# Patient Record
Sex: Male | Born: 1975 | Race: Asian | Hispanic: No | Marital: Married | Smoking: Former smoker
Health system: Southern US, Community
[De-identification: ages and names within clinical notes are randomized; demographics above are authoritative.]

## PROBLEM LIST (undated history)

## (undated) DIAGNOSIS — L729 Follicular cyst of the skin and subcutaneous tissue, unspecified: Secondary | ICD-10-CM

## (undated) DIAGNOSIS — D329 Benign neoplasm of meninges, unspecified: Secondary | ICD-10-CM

## (undated) DIAGNOSIS — E119 Type 2 diabetes mellitus without complications: Secondary | ICD-10-CM

## (undated) DIAGNOSIS — J302 Other seasonal allergic rhinitis: Secondary | ICD-10-CM

## (undated) DIAGNOSIS — K759 Inflammatory liver disease, unspecified: Secondary | ICD-10-CM

---

## 2019-12-05 ENCOUNTER — Encounter: Payer: Self-pay | Admitting: Plastic Surgery

## 2019-12-05 ENCOUNTER — Ambulatory Visit (INDEPENDENT_AMBULATORY_CARE_PROVIDER_SITE_OTHER): Payer: 59 | Admitting: Plastic Surgery

## 2019-12-05 ENCOUNTER — Other Ambulatory Visit: Payer: Self-pay

## 2019-12-05 VITALS — BP 120/76 | HR 78 | Temp 98.2°F | Ht 65.0 in | Wt 192.4 lb

## 2019-12-05 DIAGNOSIS — D179 Benign lipomatous neoplasm, unspecified: Secondary | ICD-10-CM | POA: Diagnosis not present

## 2019-12-05 DIAGNOSIS — D170 Benign lipomatous neoplasm of skin and subcutaneous tissue of head, face and neck: Secondary | ICD-10-CM | POA: Diagnosis not present

## 2019-12-05 NOTE — Progress Notes (Signed)
   Referring Provider Willeen Niece, [address] [place] [place],  [place] [id number]   CC:  Chief Complaint  Patient presents with  . Advice Only    for mass on top of head      Harold Torres is an 44 y.o. male.  HPI: Patient presents with a 63-month month history of a scalp mass that is increasing in size.  He says it started small and has grown to about 7 inches in diameter.  It does not particularly hurt him but he is concerned about it and wants it to be removed.  He has been referred from his primary care to a dermatologist and subsequently to me.  He has not had any other worrisome lesions removed in the past.  No Known Allergies  Outpatient Encounter Medications as of 12/05/2019  Medication Sig  . metFORMIN (GLUCOPHAGE-XR) 500 MG 24 hr tablet Take 1,000 mg by mouth 2 (two) times daily.   No facility-administered encounter medications on file as of 12/05/2019.     No past medical history on file. Diabetes No family history on file.  Social History   Social History Narrative  . Not on file  -Denies tobacco use  Review of Systems General: Denies fevers, chills, weight loss CV: Denies chest pain, shortness of breath, palpitations  Physical Exam Vitals with BMI 12/05/2019  Height 5\' 5"   Weight 192 lbs 6 oz  BMI [id number]  Systolic [id number]  Diastolic 76  Pulse 78    General:  No acute distress,  Alert and oriented, Non-Toxic, Normal speech and affect Scalp: He has a approximately 15 cm diameter subcutaneous mass on the scalp vertex extending posteriorly.  It feels fairly firmly adherent but this could be just due to the size and location beneath the galea.  There is no overlying skin changes.  Assessment/Plan Patient presents with a large subcutaneous mass on the posterior scalp.  I suspect this is a lipoma.  Due to the accelerated growth, large size, and firm fixed feeling I would like to get a CT scan prior to going to the operating room.  We will plan to  get this in his lungs there is nothing alarming we will plan to excise in the operating room under general anesthesia.  I discussed the risk include bleeding, infection, damage surrounding structures, need for additional procedures.  He is fully understanding and will plan to proceed accordingly.  Cindra Presume 12/05/2019, 2:31 PM

## 2019-12-18 ENCOUNTER — Other Ambulatory Visit: Payer: Self-pay

## 2019-12-18 ENCOUNTER — Encounter: Payer: Self-pay | Admitting: Surgical

## 2019-12-18 ENCOUNTER — Encounter (HOSPITAL_BASED_OUTPATIENT_CLINIC_OR_DEPARTMENT_OTHER): Payer: Self-pay | Admitting: Plastic Surgery

## 2019-12-18 NOTE — Progress Notes (Signed)
Patient ID: Harold Torres, male    DOB: Sep 12, 1975, 44 y.o.   MRN: [id number]  Chief Complaint  Patient presents with  . Pre-op Exam      ICD-10-CM   1. 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  male  with a 6 month history of a scalp mass that has been increasing in size.  He presents for preoperative evaluation for upcoming procedure, excision of scalp mass, scheduled for 12/15/19 with Dr. Mingo Amber. Interpreter present throughout today's entire visit  Patient had a CT scan on 12/20/19 for further evaluation.  CT scan showed "9.9 x 8.3 x 4.2 cm soft tissue mass with peripheral calcifications is seen within the posterior scalp which is seen to cause significant destruction or erosion of the adjacent posterior Calvarium"   Patient reports no history of anesthesia.  No family history of any complications with anesthesia.  He is not taking any blood thinners.  No history or family history of DVT/PE.  No family or personal history of bleeding or clotting disorders.  He reports that he was recently diagnosed with diabetes, most recent A1c 13.1 approximately 1 month ago.  He also reports that he was recently diagnosed with hepatitis B and is planning to begin treatment for that soon.  He denies any visual changes, dizziness, weakness, abnormal gait.  PMH Significant for: DM2 on metformin - which was recently started. Most recent A1c 13.1 (11/06/19).    Past Medical History: Allergies: No Known Allergies  Current Medications:  Current Outpatient Medications:  .  metFORMIN (GLUCOPHAGE-XR) 500 MG 24 hr tablet, Take 1,000 mg by mouth 2 (two) times daily., Disp: , Rfl:   Past Medical Problems: Past Medical History:  Diagnosis Date  . Diabetes mellitus without complication [facility])     Past Surgical History: History reviewed. No pertinent surgical history.  Social History: Social History   Socioeconomic History  . Marital status:  Married    Spouse name: Not on file  . Number of children: Not on file  . Years of education: Not on file  . Highest education level: Not on file  Occupational History  . Not on file  Tobacco Use  . Smoking status: Never Smoker  . Smokeless tobacco: Never Used  Substance and Sexual Activity  . Alcohol use: Not on file  . Drug use: Not on file  . Sexual activity: Not on file  Other Topics Concern  . Not on file  Social History Narrative  . Not on file   Social Determinants of Health   Financial Resource Strain:   . Difficulty of Paying Living Expenses:   Food Insecurity:   . Worried About Charity fundraiser in the Last Year:   . Arboriculturist in the Last Year:   Transportation Needs:   . Film/video editor (Medical):   Marland Kitchen Lack of Transportation (Non-Medical):   Physical Activity:   . Days of Exercise per Week:   . Minutes of Exercise per Session:   Stress:   . Feeling of Stress :   Social Connections:   . Frequency of Communication with Friends and Family:   . Frequency of Social Gatherings with Friends and Family:   . Attends Religious Services:   . Active Member of Clubs or Organizations:   . Attends Archivist Meetings:   Marland Kitchen Marital Status:   Intimate Partner Violence:   .  Fear of Current or Ex-Partner:   . Emotionally Abused:   Marland Kitchen Physically Abused:   . Sexually Abused:     Family History: History reviewed. No pertinent family history.  Review of Systems: Review of Systems  Constitutional: Negative.   Respiratory: Negative.   Cardiovascular: Negative.   Neurological: Negative for dizziness, sensory change, weakness and headaches.    Physical Exam: Vital Signs BP 123/81 (BP Location: Left Arm, Patient Position: Sitting, Cuff Size: Large)   Pulse 75   Temp 97.8 F (36.6 C) (Temporal)   Ht 5\' 5"  (1.651 m)   Wt 191 lb 9.6 oz (86.9 kg)   SpO2 97%   BMI 31.88 kg/m  Physical Exam  Constitutional:      General: not in acute distress.     Appearance: Normal appearance. not ill-appearing.  HENT:     Head: Large posterior head mass Eyes:     Pupils: Pupils are equal, round Neck:     Musculoskeletal: Normal range of motion.  Pulmonary:     Effort: Pulmonary effort is normal. No respiratory distress.  Musculoskeletal: Normal range of motion.  Skin:    General: Skin is warm and dry.     Findings: No erythema or rash.  Neurological:     General: No focal deficit present.     Mental Status:  alert and oriented to person, place, and time. Mental status is at baseline.     Motor: No weakness.  Psychiatric:        Mood and Affect: Mood normal.        Behavior: Behavior normal.     Assessment/Plan: Surgery has been canceled.  Smoking Status: Non-smoker Pictures obtained: 12/05/19  Patient was provided with the General Surgical Risk consent document and Pain Medication Agreement prior to their appointment.  They had adequate time to read through the risk consent documents and Pain Medication Agreement. We also discussed them in person together during this preop appointment. All of their questions were answered to their satisfaction.  Recommended calling if they have any further questions.  Risk consent form and Pain Medication Agreement to be scanned into patient's chart.  After reviewing CT scan results with Dr. Claudia Desanctis, we have referred patient for a urgent consult to neurosurgery for further evaluation of head mass due to mass invading the calvarium.  Discussed with patient that we remain available as needed for any assistance with soft tissue coverage after excision large head mass.  It was recommended patient have an MRI for further evaluation, will defer this to neuro surgery to allow them to determine best imaging methods.  Please call with any questions or concerns.  Surgery has been canceled.   Electronically signed by: Carola Rhine Aerabella Galasso, PA-C 12/21/2019 11:37 AM

## 2019-12-20 ENCOUNTER — Ambulatory Visit (HOSPITAL_COMMUNITY)
Admission: RE | Admit: 2019-12-20 | Discharge: 2019-12-20 | Disposition: A | Discharge status: Home / Self Care | Payer: 59 | Source: Ambulatory Visit | Attending: Plastic Surgery | Admitting: Plastic Surgery

## 2019-12-20 ENCOUNTER — Other Ambulatory Visit: Payer: Self-pay

## 2019-12-20 DIAGNOSIS — D179 Benign lipomatous neoplasm, unspecified: Secondary | ICD-10-CM | POA: Insufficient documentation

## 2019-12-21 ENCOUNTER — Encounter: Payer: Self-pay | Admitting: Surgical

## 2019-12-21 ENCOUNTER — Ambulatory Visit (INDEPENDENT_AMBULATORY_CARE_PROVIDER_SITE_OTHER): Payer: 59 | Admitting: Surgical

## 2019-12-21 ENCOUNTER — Other Ambulatory Visit (HOSPITAL_COMMUNITY): Payer: 59

## 2019-12-21 VITALS — BP 123/81 | HR 75 | Temp 97.8°F | Ht 65.0 in | Wt 191.6 lb

## 2019-12-21 DIAGNOSIS — D170 Benign lipomatous neoplasm of skin and subcutaneous tissue of head, face and neck: Secondary | ICD-10-CM

## 2019-12-24 ENCOUNTER — Telehealth: Payer: Self-pay | Admitting: Plastic Surgery

## 2019-12-24 NOTE — Telephone Encounter (Signed)
[facility] called to advise Dr. Claudia Desanctis that patient is getting MRI and he will keep him in the loop as they go along.

## 2019-12-25 ENCOUNTER — Ambulatory Visit (HOSPITAL_BASED_OUTPATIENT_CLINIC_OR_DEPARTMENT_OTHER): Admission: RE | Admit: 2019-12-25 | Payer: 59 | Source: Home / Self Care | Admitting: Plastic Surgery

## 2019-12-25 HISTORY — DX: Type 2 diabetes mellitus without complications: E11.9

## 2019-12-25 SURGERY — EXCISION, MASS, HEAD
Anesthesia: General | Site: Scalp

## 2020-01-03 ENCOUNTER — Encounter: Payer: 59 | Admitting: Plastic Surgery

## 2020-01-22 ENCOUNTER — Other Ambulatory Visit: Payer: Self-pay | Admitting: Neurosurgery

## 2020-01-28 ENCOUNTER — Ambulatory Visit: Payer: 59 | Attending: Internal Medicine

## 2020-01-28 ENCOUNTER — Inpatient Hospital Stay (HOSPITAL_COMMUNITY): Admission: RE | Admit: 2020-01-28 | Payer: 59 | Source: Ambulatory Visit

## 2020-01-28 DIAGNOSIS — Z20822 Contact with and (suspected) exposure to covid-19: Secondary | ICD-10-CM

## 2020-01-29 LAB — SARS-COV-2, NAA 2 DAY TAT

## 2020-01-29 LAB — NOVEL CORONAVIRUS, NAA: SARS-CoV-2, NAA: NOT DETECTED

## 2020-01-30 ENCOUNTER — Encounter (HOSPITAL_COMMUNITY): Payer: Self-pay | Admitting: Neurosurgery

## 2020-01-30 ENCOUNTER — Other Ambulatory Visit: Payer: Self-pay

## 2020-01-30 NOTE — Progress Notes (Signed)
SDW-pre-op call completed using [place] Geophysical data processor) interpreter [place] # [phone].  Pt denies SOB, chest pain, and being under the care of a cardiologist. Pt denies having a stress test, echo and cardiac cath. Pt denies having an EKG and chest ray. Pt made aware to stop taking Aspirin (unless otherwise advised by surgeon), vitamins, fish oil and herbal medications. Do not take any NSAIDs ie: Ibuprofen, Advil, Naproxen (Aleve), Motrin, BC and Goody Powder. Pt made aware to not take Metformin on DOS. Pt made aware to check CBG every 2 hours prior to arrival to hospital on DOS. Pt made aware to treat a CBG < 70 with 4 ounces of apple or cranberry juice, wait 15 minutes after intervention to recheck CBG, if CBG remains < 70, call Short Stay unit to speak with a nurse. Pt reminded to quarantine. Pt verbalized understanding of all pre-op instructions.

## 2020-01-31 ENCOUNTER — Encounter (HOSPITAL_COMMUNITY): Payer: Self-pay | Admitting: Neurosurgery

## 2020-01-31 ENCOUNTER — Inpatient Hospital Stay (HOSPITAL_COMMUNITY): Payer: 59 | Admitting: Physician Assistant

## 2020-01-31 ENCOUNTER — Inpatient Hospital Stay (HOSPITAL_COMMUNITY)
Admission: RE | Admit: 2020-01-31 | Discharge: 2020-02-12 | DRG: 025 | Disposition: A | Discharge status: Home / Self Care | Payer: 59 | Attending: Neurosurgery | Admitting: Neurosurgery

## 2020-01-31 ENCOUNTER — Inpatient Hospital Stay (HOSPITAL_COMMUNITY): Payer: 59

## 2020-01-31 ENCOUNTER — Other Ambulatory Visit: Payer: Self-pay

## 2020-01-31 ENCOUNTER — Inpatient Hospital Stay (HOSPITAL_COMMUNITY)
Admission: RE | Disposition: A | Discharge status: Home / Self Care | Payer: Self-pay | Source: Home / Self Care | Attending: Neurosurgery

## 2020-01-31 DIAGNOSIS — D62 Acute posthemorrhagic anemia: Secondary | ICD-10-CM | POA: Diagnosis not present

## 2020-01-31 DIAGNOSIS — D6959 Other secondary thrombocytopenia: Secondary | ICD-10-CM | POA: Diagnosis not present

## 2020-01-31 DIAGNOSIS — R930 Abnormal findings on diagnostic imaging of skull and head, not elsewhere classified: Secondary | ICD-10-CM | POA: Diagnosis present

## 2020-01-31 DIAGNOSIS — Z9889 Other specified postprocedural states: Secondary | ICD-10-CM

## 2020-01-31 DIAGNOSIS — Z0189 Encounter for other specified special examinations: Secondary | ICD-10-CM

## 2020-01-31 DIAGNOSIS — B1910 Unspecified viral hepatitis B without hepatic coma: Secondary | ICD-10-CM | POA: Diagnosis present

## 2020-01-31 DIAGNOSIS — J9589 Other postprocedural complications and disorders of respiratory system, not elsewhere classified: Secondary | ICD-10-CM | POA: Diagnosis not present

## 2020-01-31 DIAGNOSIS — M898X8 Other specified disorders of bone, other site: Secondary | ICD-10-CM | POA: Diagnosis present

## 2020-01-31 DIAGNOSIS — D320 Benign neoplasm of cerebral meninges: Secondary | ICD-10-CM | POA: Diagnosis present

## 2020-01-31 DIAGNOSIS — R578 Other shock: Secondary | ICD-10-CM | POA: Diagnosis not present

## 2020-01-31 DIAGNOSIS — K7400 Hepatic fibrosis, unspecified: Secondary | ICD-10-CM | POA: Diagnosis present

## 2020-01-31 DIAGNOSIS — Y838 Other surgical procedures as the cause of abnormal reaction of the patient, or of later complication, without mention of misadventure at the time of the procedure: Secondary | ICD-10-CM | POA: Diagnosis present

## 2020-01-31 DIAGNOSIS — Z978 Presence of other specified devices: Secondary | ICD-10-CM

## 2020-01-31 DIAGNOSIS — D420 Neoplasm of uncertain behavior of cerebral meninges: Secondary | ICD-10-CM | POA: Diagnosis present

## 2020-01-31 DIAGNOSIS — G9731 Intraoperative hemorrhage and hematoma of a nervous system organ or structure complicating a nervous system procedure: Secondary | ICD-10-CM | POA: Diagnosis not present

## 2020-01-31 DIAGNOSIS — C700 Malignant neoplasm of cerebral meninges: Principal | ICD-10-CM | POA: Diagnosis present

## 2020-01-31 DIAGNOSIS — D481 Neoplasm of uncertain behavior of connective and other soft tissue: Secondary | ICD-10-CM | POA: Diagnosis not present

## 2020-01-31 DIAGNOSIS — Z87891 Personal history of nicotine dependence: Secondary | ICD-10-CM

## 2020-01-31 DIAGNOSIS — R1110 Vomiting, unspecified: Secondary | ICD-10-CM

## 2020-01-31 DIAGNOSIS — E119 Type 2 diabetes mellitus without complications: Secondary | ICD-10-CM | POA: Diagnosis not present

## 2020-01-31 DIAGNOSIS — Z4659 Encounter for fitting and adjustment of other gastrointestinal appliance and device: Secondary | ICD-10-CM

## 2020-01-31 DIAGNOSIS — J95821 Acute postprocedural respiratory failure: Secondary | ICD-10-CM | POA: Diagnosis not present

## 2020-01-31 DIAGNOSIS — B169 Acute hepatitis B without delta-agent and without hepatic coma: Secondary | ICD-10-CM | POA: Diagnosis not present

## 2020-01-31 HISTORY — DX: Benign neoplasm of meninges, unspecified: D32.9

## 2020-01-31 HISTORY — PX: CRANIOTOMY: SHX93

## 2020-01-31 HISTORY — DX: Follicular cyst of the skin and subcutaneous tissue, unspecified: L72.9

## 2020-01-31 HISTORY — DX: Inflammatory liver disease, unspecified: K75.9

## 2020-01-31 LAB — COMPREHENSIVE METABOLIC PANEL
ALT: 45 U/L — ABNORMAL HIGH (ref 0–44)
ALT: 65 U/L — ABNORMAL HIGH (ref 0–44)
AST: 32 U/L (ref 15–41)
AST: 65 U/L — ABNORMAL HIGH (ref 15–41)
Albumin: 3.3 g/dL — ABNORMAL LOW (ref 3.5–5.0)
Albumin: 2.7 g/dL — ABNORMAL LOW (ref 3.5–5.0)
Alkaline Phosphatase: 55 U/L (ref 38–126)
Alkaline Phosphatase: 35 U/L — ABNORMAL LOW (ref 38–126)
Anion gap: 10 (ref 5–15)
Anion gap: 10 (ref 5–15)
BUN: 25 mg/dL — ABNORMAL HIGH (ref 6–20)
BUN: 17 mg/dL (ref 6–20)
CO2: 24 mmol/L (ref 22–32)
CO2: 25 mmol/L (ref 22–32)
Calcium: 8.9 mg/dL (ref 8.9–10.3)
Calcium: 8.9 mg/dL (ref 8.9–10.3)
Chloride: 105 mmol/L (ref 98–111)
Chloride: 109 mmol/L (ref 98–111)
Creatinine, Ser: 0.84 mg/dL (ref 0.61–1.24)
Creatinine, Ser: 0.94 mg/dL (ref 0.61–1.24)
GFR calc Af Amer: >60 mL/min (ref >60)
GFR calc Af Amer: >60 mL/min (ref >60)
GFR calc non Af Amer: >60 mL/min (ref >60)
GFR calc non Af Amer: >60 mL/min (ref >60)
Glucose, Bld: 124 mg/dL — ABNORMAL HIGH (ref 70–99)
Glucose, Bld: 138 mg/dL — ABNORMAL HIGH (ref 70–99)
Potassium: 3.8 mmol/L (ref 3.5–5.1)
Potassium: 3.8 mmol/L (ref 3.5–5.1)
Sodium: 139 mmol/L (ref 135–145)
Sodium: 144 mmol/L (ref 135–145)
Total Bilirubin: 0.7 mg/dL (ref 0.3–1.2)
Total Bilirubin: 1.7 mg/dL — ABNORMAL HIGH (ref 0.3–1.2)
Total Protein: 7.9 g/dL (ref 6.5–8.1)
Total Protein: 4.7 g/dL — ABNORMAL LOW (ref 6.5–8.1)

## 2020-01-31 LAB — POCT I-STAT 7, (LYTES, BLD GAS, ICA,H+H)
Acid-Base Excess: 1 mmol/L (ref 0.0–2.0)
Acid-Base Excess: 0 mmol/L (ref 0.0–2.0)
Acid-base deficit: 3 mmol/L — ABNORMAL HIGH (ref 0.0–2.0)
Acid-base deficit: 9 mmol/L — ABNORMAL HIGH (ref 0.0–2.0)
Acid-base deficit: 9 mmol/L — ABNORMAL HIGH (ref 0.0–2.0)
Acid-base deficit: 3 mmol/L — ABNORMAL HIGH (ref 0.0–2.0)
Acid-base deficit: 2 mmol/L (ref 0.0–2.0)
Bicarbonate: 21.9 mmol/L (ref 20.0–28.0)
Bicarbonate: 25.9 mmol/L (ref 20.0–28.0)
Bicarbonate: 18.1 mmol/L — ABNORMAL LOW (ref 20.0–28.0)
Bicarbonate: 17.6 mmol/L — ABNORMAL LOW (ref 20.0–28.0)
Bicarbonate: 24 mmol/L (ref 20.0–28.0)
Bicarbonate: 23.3 mmol/L (ref 20.0–28.0)
Bicarbonate: 26.1 mmol/L (ref 20.0–28.0)
Calcium, Ion: 1.01 mmol/L — ABNORMAL LOW (ref 1.15–1.40)
Calcium, Ion: 1.12 mmol/L — ABNORMAL LOW (ref 1.15–1.40)
Calcium, Ion: 1.55 mmol/L (ref 1.15–1.40)
Calcium, Ion: 0.91 mmol/L — ABNORMAL LOW (ref 1.15–1.40)
Calcium, Ion: 0.75 mmol/L — CL (ref 1.15–1.40)
Calcium, Ion: 1.26 mmol/L (ref 1.15–1.40)
Calcium, Ion: 1.31 mmol/L (ref 1.15–1.40)
HCT: 38 % — ABNORMAL LOW (ref 39.0–52.0)
HCT: 38 % — ABNORMAL LOW (ref 39.0–52.0)
HCT: 38 % — ABNORMAL LOW (ref 39.0–52.0)
HCT: 15 % — ABNORMAL LOW (ref 39.0–52.0)
HCT: 25 % — ABNORMAL LOW (ref 39.0–52.0)
HCT: 28 % — ABNORMAL LOW (ref 39.0–52.0)
HCT: 30 % — ABNORMAL LOW (ref 39.0–52.0)
Hemoglobin: 12.9 g/dL — ABNORMAL LOW (ref 13.0–17.0)
Hemoglobin: 12.9 g/dL — ABNORMAL LOW (ref 13.0–17.0)
Hemoglobin: 12.9 g/dL — ABNORMAL LOW (ref 13.0–17.0)
Hemoglobin: 5.1 g/dL — CL (ref 13.0–17.0)
Hemoglobin: 8.5 g/dL — ABNORMAL LOW (ref 13.0–17.0)
Hemoglobin: 9.5 g/dL — ABNORMAL LOW (ref 13.0–17.0)
Hemoglobin: 10.2 g/dL — ABNORMAL LOW (ref 13.0–17.0)
O2 Saturation: 100 %
O2 Saturation: 100 %
O2 Saturation: 100 %
O2 Saturation: 100 %
O2 Saturation: 100 %
O2 Saturation: 98 %
O2 Saturation: 100 %
Patient temperature: 35.1
Patient temperature: 35.3
Patient temperature: 35.8
Patient temperature: 35.2
Patient temperature: 35.3
Patient temperature: 97.6
Potassium: 3.8 mmol/L (ref 3.5–5.1)
Potassium: 4 mmol/L (ref 3.5–5.1)
Potassium: 5.6 mmol/L — ABNORMAL HIGH (ref 3.5–5.1)
Potassium: 2.9 mmol/L — ABNORMAL LOW (ref 3.5–5.1)
Potassium: 3.8 mmol/L (ref 3.5–5.1)
Potassium: 3.6 mmol/L (ref 3.5–5.1)
Potassium: 3.4 mmol/L — ABNORMAL LOW (ref 3.5–5.1)
Sodium: 143 mmol/L (ref 135–145)
Sodium: 143 mmol/L (ref 135–145)
Sodium: 140 mmol/L (ref 135–145)
Sodium: 149 mmol/L — ABNORMAL HIGH (ref 135–145)
Sodium: 146 mmol/L — ABNORMAL HIGH (ref 135–145)
Sodium: 147 mmol/L — ABNORMAL HIGH (ref 135–145)
Sodium: 147 mmol/L — ABNORMAL HIGH (ref 135–145)
TCO2: 23 mmol/L (ref 22–32)
TCO2: 27 mmol/L (ref 22–32)
TCO2: 19 mmol/L — ABNORMAL LOW (ref 22–32)
TCO2: 19 mmol/L — ABNORMAL LOW (ref 22–32)
TCO2: 26 mmol/L (ref 22–32)
TCO2: 25 mmol/L (ref 22–32)
TCO2: 28 mmol/L (ref 22–32)
pCO2 arterial: 34.9 mmHg (ref 32.0–48.0)
pCO2 arterial: 36.5 mmHg (ref 32.0–48.0)
pCO2 arterial: 41.5 mmHg (ref 32.0–48.0)
pCO2 arterial: 43.2 mmHg (ref 32.0–48.0)
pCO2 arterial: 51 mmHg — ABNORMAL HIGH (ref 32.0–48.0)
pCO2 arterial: 40.1 mmHg (ref 32.0–48.0)
pCO2 arterial: 48.2 mmHg — ABNORMAL HIGH (ref 32.0–48.0)
pH, Arterial: 7.398 (ref 7.350–7.450)
pH, Arterial: 7.45 (ref 7.350–7.450)
pH, Arterial: 7.241 — ABNORMAL LOW (ref 7.350–7.450)
pH, Arterial: 7.217 — ABNORMAL LOW (ref 7.350–7.450)
pH, Arterial: 7.272 — ABNORMAL LOW (ref 7.350–7.450)
pH, Arterial: 7.364 (ref 7.350–7.450)
pH, Arterial: 7.338 — ABNORMAL LOW (ref 7.350–7.450)
pO2, Arterial: 192 mmHg — ABNORMAL HIGH (ref 83.0–108.0)
pO2, Arterial: 192 mmHg — ABNORMAL HIGH (ref 83.0–108.0)
pO2, Arterial: 248 mmHg — ABNORMAL HIGH (ref 83.0–108.0)
pO2, Arterial: 344 mmHg — ABNORMAL HIGH (ref 83.0–108.0)
pO2, Arterial: 444 mmHg — ABNORMAL HIGH (ref 83.0–108.0)
pO2, Arterial: 98 mmHg (ref 83.0–108.0)
pO2, Arterial: 250 mmHg — ABNORMAL HIGH (ref 83.0–108.0)

## 2020-01-31 LAB — CBC
HCT: 46 % (ref 39.0–52.0)
HCT: 31.1 % — ABNORMAL LOW (ref 39.0–52.0)
HCT: 27.5 % — ABNORMAL LOW (ref 39.0–52.0)
Hemoglobin: 15 g/dL (ref 13.0–17.0)
Hemoglobin: 10.8 g/dL — ABNORMAL LOW (ref 13.0–17.0)
Hemoglobin: 9.6 g/dL — ABNORMAL LOW (ref 13.0–17.0)
MCH: 29 pg (ref 26.0–34.0)
MCH: 30.2 pg (ref 26.0–34.0)
MCH: 29.5 pg (ref 26.0–34.0)
MCHC: 32.6 g/dL (ref 30.0–36.0)
MCHC: 34.7 g/dL (ref 30.0–36.0)
MCHC: 34.9 g/dL (ref 30.0–36.0)
MCV: 88.8 fL (ref 80.0–100.0)
MCV: 86.9 fL (ref 80.0–100.0)
MCV: 84.6 fL (ref 80.0–100.0)
Platelets: 127 10*3/uL — ABNORMAL LOW (ref 150–400)
Platelets: UNDETERMINED 10*3/uL (ref 150–400)
Platelets: 70 10*3/uL — ABNORMAL LOW (ref 150–400)
RBC: 5.18 MIL/uL (ref 4.22–5.81)
RBC: 3.58 MIL/uL — ABNORMAL LOW (ref 4.22–5.81)
RBC: 3.25 MIL/uL — ABNORMAL LOW (ref 4.22–5.81)
RDW: 13.7 % (ref 11.5–15.5)
RDW: 13.9 % (ref 11.5–15.5)
RDW: 14.2 % (ref 11.5–15.5)
WBC: 6 10*3/uL (ref 4.0–10.5)
WBC: 13.9 10*3/uL — ABNORMAL HIGH (ref 4.0–10.5)
WBC: 10.7 10*3/uL — ABNORMAL HIGH (ref 4.0–10.5)
nRBC: 0 % (ref 0.0–0.2)
nRBC: 0 % (ref 0.0–0.2)
nRBC: 0 % (ref 0.0–0.2)

## 2020-01-31 LAB — POCT I-STAT, CHEM 8
BUN: 24 mg/dL — ABNORMAL HIGH (ref 6–20)
BUN: 21 mg/dL — ABNORMAL HIGH (ref 6–20)
Calcium, Ion: 0.91 mmol/L — ABNORMAL LOW (ref 1.15–1.40)
Calcium, Ion: 0.95 mmol/L — ABNORMAL LOW (ref 1.15–1.40)
Chloride: 104 mmol/L (ref 98–111)
Chloride: 106 mmol/L (ref 98–111)
Creatinine, Ser: 0.9 mg/dL (ref 0.61–1.24)
Creatinine, Ser: 0.8 mg/dL (ref 0.61–1.24)
Glucose, Bld: 367 mg/dL — ABNORMAL HIGH (ref 70–99)
Glucose, Bld: 357 mg/dL — ABNORMAL HIGH (ref 70–99)
HCT: 18 % — ABNORMAL LOW (ref 39.0–52.0)
HCT: 24 % — ABNORMAL LOW (ref 39.0–52.0)
Hemoglobin: 6.1 g/dL — CL (ref 13.0–17.0)
Hemoglobin: 8.2 g/dL — ABNORMAL LOW (ref 13.0–17.0)
Potassium: 3.8 mmol/L (ref 3.5–5.1)
Potassium: 3.5 mmol/L (ref 3.5–5.1)
Sodium: 147 mmol/L — ABNORMAL HIGH (ref 135–145)
Sodium: 145 mmol/L (ref 135–145)
TCO2: 27 mmol/L (ref 22–32)
TCO2: 24 mmol/L (ref 22–32)

## 2020-01-31 LAB — PREPARE RBC (CROSSMATCH)

## 2020-01-31 LAB — BASIC METABOLIC PANEL
Anion gap: 11 (ref 5–15)
BUN: 18 mg/dL (ref 6–20)
CO2: 24 mmol/L (ref 22–32)
Calcium: 9.8 mg/dL (ref 8.9–10.3)
Chloride: 110 mmol/L (ref 98–111)
Creatinine, Ser: 1.11 mg/dL (ref 0.61–1.24)
GFR calc Af Amer: >60 mL/min (ref >60)
GFR calc non Af Amer: >60 mL/min (ref >60)
Glucose, Bld: 166 mg/dL — ABNORMAL HIGH (ref 70–99)
Potassium: 3.4 mmol/L — ABNORMAL LOW (ref 3.5–5.1)
Sodium: 145 mmol/L (ref 135–145)

## 2020-01-31 LAB — GLOBAL TEG PANEL
CFF Max Amplitude: 13.7 mm — ABNORMAL LOW (ref 15–32)
CK with Heparinase (R): 5.7 min (ref 4.3–8.3)
Citrated Functional Fibrinogen: 250 mg/dL — ABNORMAL LOW (ref 278–581)
Citrated Kaolin (K): 1.9 min (ref 0.8–2.1)
Citrated Kaolin (MA): 52.1 mm (ref 52–69)
Citrated Kaolin (R): 5.5 min (ref 4.6–9.1)
Citrated Kaolin Angle: 70.1 deg (ref 63–78)
Citrated Rapid TEG (MA): 48.8 mm — ABNORMAL LOW (ref 52–70)

## 2020-01-31 LAB — APTT: aPTT: 34 seconds (ref 24–36)

## 2020-01-31 LAB — ABO/RH: ABO/RH(D): B POS

## 2020-01-31 LAB — MAGNESIUM: Magnesium: 1.6 mg/dL — ABNORMAL LOW (ref 1.7–2.4)

## 2020-01-31 LAB — GLUCOSE, CAPILLARY
Glucose-Capillary: 123 mg/dL — ABNORMAL HIGH (ref 70–99)
Glucose-Capillary: 155 mg/dL — ABNORMAL HIGH (ref 70–99)
Glucose-Capillary: 156 mg/dL — ABNORMAL HIGH (ref 70–99)
Glucose-Capillary: 140 mg/dL — ABNORMAL HIGH (ref 70–99)
Glucose-Capillary: 127 mg/dL — ABNORMAL HIGH (ref 70–99)

## 2020-01-31 LAB — MASSIVE TRANSFUSION PROTOCOL ORDER (BLOOD BANK NOTIFICATION)

## 2020-01-31 LAB — HEMOGLOBIN A1C
Hgb A1c MFr Bld: 7.1 % — ABNORMAL HIGH (ref 4.8–5.6)
Mean Plasma Glucose: 157.07 mg/dL

## 2020-01-31 LAB — MRSA PCR SCREENING: MRSA by PCR: NEGATIVE

## 2020-01-31 LAB — PROTIME-INR
INR: 1.3 — ABNORMAL HIGH (ref 0.8–1.2)
Prothrombin Time: 16 seconds — ABNORMAL HIGH (ref 11.4–15.2)

## 2020-01-31 SURGERY — CRANIOTOMY TUMOR EXCISION
Anesthesia: General | Site: Head | Laterality: Bilateral

## 2020-01-31 MED ORDER — ACETAMINOPHEN 325 MG PO TABS: 325.0000 mg | ORAL_TABLET | Freq: Once | ORAL | Status: DC | PRN

## 2020-01-31 MED ORDER — SODIUM CHLORIDE 0.9% IV SOLUTION: Freq: Once | INTRAVENOUS | Status: DC

## 2020-01-31 MED ORDER — MIDAZOLAM HCL 2 MG/2ML IJ SOLN
INTRAMUSCULAR | Status: AC
  Filled 2020-01-31: qty 2

## 2020-01-31 MED ORDER — PHENYLEPHRINE HCL-NACL 10-0.9 MG/250ML-% IV SOLN
INTRAVENOUS | Status: DC | PRN
  Administered 2020-01-31: 40 ug/min via INTRAVENOUS

## 2020-01-31 MED ORDER — HEPARIN SODIUM (PORCINE) 5000 UNIT/ML IJ SOLN
5000.0000 [IU] | Freq: Three times a day (TID) | INTRAMUSCULAR | Status: DC
  Administered 2020-02-01 – 2020-02-12 (×32): 5000 [IU] via SUBCUTANEOUS
  Filled 2020-01-31 (×32): qty 1

## 2020-01-31 MED ORDER — INSULIN REGULAR(HUMAN) IN NACL 100-0.9 UT/100ML-% IV SOLN
INTRAVENOUS | Status: DC | PRN
  Administered 2020-01-31: 18 [IU]/h via INTRAVENOUS

## 2020-01-31 MED ORDER — METFORMIN HCL ER 750 MG PO TB24: 2000.0000 mg | ORAL_TABLET | Freq: Every day | ORAL | Status: DC

## 2020-01-31 MED ORDER — FENTANYL BOLUS VIA INFUSION
50.0000 ug | INTRAVENOUS | Status: DC | PRN
  Filled 2020-01-31: qty 50

## 2020-01-31 MED ORDER — FENTANYL 2500MCG IN NS 250ML (10MCG/ML) PREMIX INFUSION
50.0000 ug/h | INTRAVENOUS | Status: DC
  Administered 2020-01-31: 50 ug/h via INTRAVENOUS
  Filled 2020-01-31: qty 250

## 2020-01-31 MED ORDER — LIDOCAINE-EPINEPHRINE 0.5 %-1:200000 IJ SOLN
INTRAMUSCULAR | Status: AC
  Filled 2020-01-31: qty 1

## 2020-01-31 MED ORDER — HYDROCODONE-ACETAMINOPHEN 5-325 MG PO TABS
1.0000 | ORAL_TABLET | ORAL | Status: DC | PRN
  Administered 2020-02-01: 1 via ORAL
  Filled 2020-01-31 (×3): qty 1

## 2020-01-31 MED ORDER — ACETAMINOPHEN 325 MG PO TABS
650.0000 mg | ORAL_TABLET | ORAL | Status: DC | PRN
  Filled 2020-01-31: qty 2

## 2020-01-31 MED ORDER — MORPHINE SULFATE (PF) 2 MG/ML IV SOLN
1.0000 mg | INTRAVENOUS | Status: DC | PRN
  Administered 2020-02-01: 2 mg via INTRAVENOUS
  Filled 2020-01-31: qty 1

## 2020-01-31 MED ORDER — THROMBIN 5000 UNITS EX SOLR
CUTANEOUS | Status: AC
  Filled 2020-01-31: qty 10000

## 2020-01-31 MED ORDER — CEFAZOLIN SODIUM-DEXTROSE 2-4 GM/100ML-% IV SOLN
2.0000 g | Freq: Three times a day (TID) | INTRAVENOUS | Status: AC
  Administered 2020-01-31 – 2020-02-01 (×2): 2 g via INTRAVENOUS
  Filled 2020-01-31 (×2): qty 100

## 2020-01-31 MED ORDER — PROPOFOL 500 MG/50ML IV EMUL
INTRAVENOUS | Status: DC | PRN
  Administered 2020-01-31: 75 ug/kg/min via INTRAVENOUS

## 2020-01-31 MED ORDER — FENTANYL CITRATE (PF) 250 MCG/5ML IJ SOLN
INTRAMUSCULAR | Status: AC
  Filled 2020-01-31: qty 5

## 2020-01-31 MED ORDER — SODIUM BICARBONATE 8.4 % IV SOLN
INTRAVENOUS | Status: DC | PRN
  Administered 2020-01-31 (×3): 50 meq via INTRAVENOUS

## 2020-01-31 MED ORDER — ACETAMINOPHEN 650 MG RE SUPP: 650.0000 mg | RECTAL | Status: DC | PRN

## 2020-01-31 MED ORDER — CLEVIDIPINE BUTYRATE 0.5 MG/ML IV EMUL
0.0000 mg/h | INTRAVENOUS | Status: DC
  Filled 2020-01-31: qty 50

## 2020-01-31 MED ORDER — ACETAMINOPHEN 160 MG/5ML PO SOLN: 325.0000 mg | Freq: Once | ORAL | Status: DC | PRN

## 2020-01-31 MED ORDER — PROMETHAZINE HCL 12.5 MG PO TABS
12.5000 mg | ORAL_TABLET | ORAL | Status: DC | PRN
  Filled 2020-01-31: qty 2

## 2020-01-31 MED ORDER — NOREPINEPHRINE 4 MG/250ML-% IV SOLN
INTRAVENOUS | Status: AC
  Filled 2020-01-31: qty 250

## 2020-01-31 MED ORDER — INSULIN ASPART 100 UNIT/ML ~~LOC~~ SOLN: 0.0000 [IU] | SUBCUTANEOUS | Status: DC

## 2020-01-31 MED ORDER — SODIUM CHLORIDE 0.9 % IV SOLN: INTRAVENOUS | Status: DC | PRN

## 2020-01-31 MED ORDER — PROPOFOL 1000 MG/100ML IV EMUL
0.0000 ug/kg/min | INTRAVENOUS | Status: DC
  Administered 2020-01-31: 20 ug/kg/min via INTRAVENOUS
  Administered 2020-01-31: 10 ug/kg/min via INTRAVENOUS
  Administered 2020-02-01: 5 ug/kg/min via INTRAVENOUS
  Filled 2020-01-31 (×2): qty 100

## 2020-01-31 MED ORDER — ONDANSETRON HCL 4 MG PO TABS: 4.0000 mg | ORAL_TABLET | ORAL | Status: DC | PRN

## 2020-01-31 MED ORDER — PROPOFOL 10 MG/ML IV BOLUS
INTRAVENOUS | Status: AC
  Filled 2020-01-31: qty 40

## 2020-01-31 MED ORDER — SODIUM CHLORIDE 0.9 % IV SOLN
250.0000 mL | INTRAVENOUS | Status: DC
  Administered 2020-01-31: 250 mL via INTRAVENOUS

## 2020-01-31 MED ORDER — CHLORHEXIDINE GLUCONATE CLOTH 2 % EX PADS
6.0000 | MEDICATED_PAD | Freq: Every day | CUTANEOUS | Status: DC
  Administered 2020-02-01 – 2020-02-04 (×4): 6 via TOPICAL

## 2020-01-31 MED ORDER — POLYETHYLENE GLYCOL 3350 17 G PO PACK
17.0000 g | PACK | Freq: Every day | ORAL | Status: DC
  Administered 2020-02-01 – 2020-02-02 (×2): 17 g
  Filled 2020-01-31 (×2): qty 1

## 2020-01-31 MED ORDER — ROCURONIUM BROMIDE 10 MG/ML (PF) SYRINGE
PREFILLED_SYRINGE | INTRAVENOUS | Status: DC | PRN
  Administered 2020-01-31: 60 mg via INTRAVENOUS
  Administered 2020-01-31: 70 mg via INTRAVENOUS
  Administered 2020-01-31: 50 mg via INTRAVENOUS
  Administered 2020-01-31: 30 mg via INTRAVENOUS
  Administered 2020-01-31: 40 mg via INTRAVENOUS
  Administered 2020-01-31: 50 mg via INTRAVENOUS

## 2020-01-31 MED ORDER — FENTANYL CITRATE (PF) 100 MCG/2ML IJ SOLN
INTRAMUSCULAR | Status: DC | PRN
  Administered 2020-01-31: 250 ug via INTRAVENOUS

## 2020-01-31 MED ORDER — MEPERIDINE HCL 25 MG/ML IJ SOLN: 6.2500 mg | INTRAMUSCULAR | Status: DC | PRN

## 2020-01-31 MED ORDER — DOCUSATE SODIUM 50 MG/5ML PO LIQD
100.0000 mg | Freq: Two times a day (BID) | ORAL | Status: DC
  Administered 2020-01-31 – 2020-02-02 (×5): 100 mg
  Filled 2020-01-31 (×5): qty 10

## 2020-01-31 MED ORDER — ORAL CARE MOUTH RINSE: 15.0000 mL | Freq: Once | OROMUCOSAL | Status: AC

## 2020-01-31 MED ORDER — MIDAZOLAM HCL 2 MG/2ML IJ SOLN
INTRAMUSCULAR | Status: DC | PRN
  Administered 2020-01-31 (×2): 2 mg via INTRAVENOUS

## 2020-01-31 MED ORDER — THROMBIN 20000 UNITS EX SOLR: CUTANEOUS | Status: DC | PRN

## 2020-01-31 MED ORDER — THROMBIN 20000 UNITS EX SOLR
CUTANEOUS | Status: AC
  Filled 2020-01-31: qty 40000

## 2020-01-31 MED ORDER — PROPOFOL 10 MG/ML IV BOLUS
INTRAVENOUS | Status: DC | PRN
  Administered 2020-01-31: 170 mg via INTRAVENOUS
  Administered 2020-01-31: 60 mg via INTRAVENOUS

## 2020-01-31 MED ORDER — VASOPRESSIN 20 UNIT/ML IV SOLN
INTRAVENOUS | Status: AC
  Filled 2020-01-31: qty 1

## 2020-01-31 MED ORDER — FAMOTIDINE IN NACL 20-0.9 MG/50ML-% IV SOLN
20.0000 mg | INTRAVENOUS | Status: DC
  Administered 2020-01-31: 20 mg via INTRAVENOUS
  Filled 2020-01-31: qty 50

## 2020-01-31 MED ORDER — LIDOCAINE 2% (20 MG/ML) 5 ML SYRINGE
INTRAMUSCULAR | Status: DC | PRN
  Administered 2020-01-31: 30 mg via INTRAVENOUS

## 2020-01-31 MED ORDER — PHENYLEPHRINE 40 MCG/ML (10ML) SYRINGE FOR IV PUSH (FOR BLOOD PRESSURE SUPPORT)
PREFILLED_SYRINGE | INTRAVENOUS | Status: DC | PRN
  Administered 2020-01-31: 80 ug via INTRAVENOUS
  Administered 2020-01-31: 120 ug via INTRAVENOUS
  Administered 2020-01-31: 200 ug via INTRAVENOUS
  Administered 2020-01-31: 160 ug via INTRAVENOUS
  Administered 2020-01-31: 200 ug via INTRAVENOUS
  Administered 2020-01-31: 160 ug via INTRAVENOUS

## 2020-01-31 MED ORDER — THROMBIN 20000 UNITS EX SOLR
CUTANEOUS | Status: AC
  Filled 2020-01-31: qty 20000

## 2020-01-31 MED ORDER — ORAL CARE MOUTH RINSE
15.0000 mL | OROMUCOSAL | Status: DC
  Administered 2020-01-31 – 2020-02-03 (×27): 15 mL via OROMUCOSAL

## 2020-01-31 MED ORDER — CHLORHEXIDINE GLUCONATE 0.12 % MT SOLN
15.0000 mL | Freq: Once | OROMUCOSAL | Status: AC
  Administered 2020-01-31: 15 mL via OROMUCOSAL
  Filled 2020-01-31: qty 15

## 2020-01-31 MED ORDER — CHLORHEXIDINE GLUCONATE CLOTH 2 % EX PADS: 6.0000 | MEDICATED_PAD | Freq: Once | CUTANEOUS | Status: DC

## 2020-01-31 MED ORDER — ALBUMIN HUMAN 5 % IV SOLN
INTRAVENOUS | Status: DC | PRN
Start: 2020-01-31 — End: 2020-01-31

## 2020-01-31 MED ORDER — HYDROMORPHONE HCL 1 MG/ML IJ SOLN: 0.2500 mg | INTRAMUSCULAR | Status: DC | PRN

## 2020-01-31 MED ORDER — NOREPINEPHRINE 4 MG/250ML-% IV SOLN
INTRAVENOUS | Status: DC | PRN
Start: 2020-01-31 — End: 2020-01-31
  Administered 2020-01-31: 5 ug/min via INTRAVENOUS

## 2020-01-31 MED ORDER — THROMBIN 5000 UNITS EX SOLR
CUTANEOUS | Status: DC | PRN
  Administered 2020-01-31: 5000 [IU] via TOPICAL

## 2020-01-31 MED ORDER — BACITRACIN ZINC 500 UNIT/GM EX OINT
TOPICAL_OINTMENT | CUTANEOUS | Status: AC
  Filled 2020-01-31: qty 28.35

## 2020-01-31 MED ORDER — CHLORHEXIDINE GLUCONATE 0.12% ORAL RINSE (MEDLINE KIT)
15.0000 mL | Freq: Two times a day (BID) | OROMUCOSAL | Status: DC
  Administered 2020-01-31 – 2020-02-03 (×6): 15 mL via OROMUCOSAL

## 2020-01-31 MED ORDER — NALOXONE HCL 0.4 MG/ML IJ SOLN: 0.0800 mg | INTRAMUSCULAR | Status: DC | PRN

## 2020-01-31 MED ORDER — EPINEPHRINE HCL 5 MG/250ML IV SOLN IN NS
INTRAVENOUS | Status: DC | PRN
  Administered 2020-01-31: 4 ug/min via INTRAVENOUS

## 2020-01-31 MED ORDER — SENNA 8.6 MG PO TABS
1.0000 | ORAL_TABLET | Freq: Two times a day (BID) | ORAL | Status: DC
  Administered 2020-01-31 – 2020-02-12 (×16): 8.6 mg
  Filled 2020-01-31 (×18): qty 1

## 2020-01-31 MED ORDER — DEXAMETHASONE SODIUM PHOSPHATE 10 MG/ML IJ SOLN
INTRAMUSCULAR | Status: DC | PRN
  Administered 2020-01-31: 10 mg via INTRAVENOUS

## 2020-01-31 MED ORDER — PANTOPRAZOLE SODIUM 40 MG IV SOLR
40.0000 mg | Freq: Every day | INTRAVENOUS | Status: DC
  Administered 2020-01-31 – 2020-02-01 (×2): 40 mg via INTRAVENOUS
  Filled 2020-01-31 (×2): qty 40

## 2020-01-31 MED ORDER — ACETAMINOPHEN 10 MG/ML IV SOLN: 1000.0000 mg | Freq: Once | INTRAVENOUS | Status: DC | PRN

## 2020-01-31 MED ORDER — THROMBIN 20000 UNITS EX KIT
PACK | CUTANEOUS | Status: DC | PRN
  Administered 2020-01-31: 20000 [IU] via TOPICAL

## 2020-01-31 MED ORDER — POLYETHYLENE GLYCOL 3350 17 G PO PACK
17.0000 g | PACK | Freq: Every day | ORAL | Status: DC
  Filled 2020-01-31: qty 1

## 2020-01-31 MED ORDER — PROPOFOL 1000 MG/100ML IV EMUL
INTRAVENOUS | Status: AC
  Filled 2020-01-31: qty 100

## 2020-01-31 MED ORDER — THROMBIN 20000 UNITS EX KIT
PACK | CUTANEOUS | Status: AC
  Filled 2020-01-31: qty 1

## 2020-01-31 MED ORDER — SENNOSIDES-DOCUSATE SODIUM 8.6-50 MG PO TABS: 1.0000 | ORAL_TABLET | Freq: Every evening | ORAL | Status: DC | PRN

## 2020-01-31 MED ORDER — FENTANYL CITRATE (PF) 100 MCG/2ML IJ SOLN
50.0000 ug | Freq: Once | INTRAMUSCULAR | Status: AC
  Administered 2020-01-31: 50 ug via INTRAVENOUS
  Filled 2020-01-31: qty 2

## 2020-01-31 MED ORDER — THROMBIN 5000 UNITS EX SOLR
OROMUCOSAL | Status: DC | PRN
  Administered 2020-01-31: 5 mL via TOPICAL

## 2020-01-31 MED ORDER — CEFAZOLIN SODIUM-DEXTROSE 2-4 GM/100ML-% IV SOLN
2.0000 g | INTRAVENOUS | Status: AC
  Administered 2020-01-31 (×2): 2 g via INTRAVENOUS
  Filled 2020-01-31: qty 100

## 2020-01-31 MED ORDER — SENNA 8.6 MG PO TABS
1.0000 | ORAL_TABLET | Freq: Two times a day (BID) | ORAL | Status: DC
  Filled 2020-01-31: qty 1

## 2020-01-31 MED ORDER — EPINEPHRINE 1 MG/10ML IJ SOSY
PREFILLED_SYRINGE | INTRAMUSCULAR | Status: DC | PRN
Start: 2020-01-31 — End: 2020-01-31
  Administered 2020-01-31 (×12): .5 mg via INTRAVENOUS

## 2020-01-31 MED ORDER — DOCUSATE SODIUM 50 MG/5ML PO LIQD
100.0000 mg | Freq: Two times a day (BID) | ORAL | Status: DC
  Filled 2020-01-31: qty 10

## 2020-01-31 MED ORDER — VASOPRESSIN 20 UNIT/ML IV SOLN
INTRAVENOUS | Status: DC | PRN
Start: 2020-01-31 — End: 2020-01-31
  Administered 2020-01-31: 4 [IU] via INTRAVENOUS
  Administered 2020-01-31: 3 [IU] via INTRAVENOUS
  Administered 2020-01-31: 2 [IU] via INTRAVENOUS

## 2020-01-31 MED ORDER — 0.9 % SODIUM CHLORIDE (POUR BTL) OPTIME
TOPICAL | Status: DC | PRN
  Administered 2020-01-31 (×4): 1000 mL

## 2020-01-31 MED ORDER — LACTATED RINGERS IV BOLUS
2000.0000 mL | Freq: Once | INTRAVENOUS | Status: AC
  Administered 2020-01-31: 2000 mL via INTRAVENOUS

## 2020-01-31 MED ORDER — LACTATED RINGERS IV SOLN: INTRAVENOUS | Status: DC

## 2020-01-31 MED ORDER — LEVETIRACETAM IN NACL 500 MG/100ML IV SOLN
500.0000 mg | Freq: Two times a day (BID) | INTRAVENOUS | Status: DC
  Administered 2020-01-31 – 2020-02-04 (×8): 500 mg via INTRAVENOUS
  Filled 2020-01-31 (×8): qty 100

## 2020-01-31 MED ORDER — LABETALOL HCL 5 MG/ML IV SOLN
10.0000 mg | INTRAVENOUS | Status: DC | PRN
  Administered 2020-01-31 (×2): 10 mg via INTRAVENOUS
  Filled 2020-01-31: qty 4

## 2020-01-31 MED ORDER — BISACODYL 5 MG PO TBEC: 5.0000 mg | DELAYED_RELEASE_TABLET | Freq: Every day | ORAL | Status: DC | PRN

## 2020-01-31 MED ORDER — CALCIUM CHLORIDE 10 % IV SOLN
INTRAVENOUS | Status: DC | PRN
  Administered 2020-01-31: 200 mg via INTRAVENOUS
  Administered 2020-01-31: 500 mg via INTRAVENOUS
  Administered 2020-01-31: 300 mg via INTRAVENOUS
  Administered 2020-01-31 (×2): 400 mg via INTRAVENOUS
  Administered 2020-01-31: 300 mg via INTRAVENOUS
  Administered 2020-01-31: 200 mg via INTRAVENOUS
  Administered 2020-01-31: 300 mg via INTRAVENOUS
  Administered 2020-01-31: 500 mg via INTRAVENOUS
  Administered 2020-01-31 (×2): 200 mg via INTRAVENOUS
  Administered 2020-01-31: 500 mg via INTRAVENOUS

## 2020-01-31 MED ORDER — POTASSIUM CHLORIDE IN NACL 20-0.9 MEQ/L-% IV SOLN
INTRAVENOUS | Status: DC
  Filled 2020-01-31 (×3): qty 1000

## 2020-01-31 MED ORDER — INSULIN ASPART 100 UNIT/ML ~~LOC~~ SOLN
0.0000 [IU] | SUBCUTANEOUS | Status: DC
  Administered 2020-01-31: 3 [IU] via SUBCUTANEOUS
  Administered 2020-01-31 – 2020-02-03 (×6): 2 [IU] via SUBCUTANEOUS
  Administered 2020-02-03: 3 [IU] via SUBCUTANEOUS
  Administered 2020-02-03 (×2): 2 [IU] via SUBCUTANEOUS
  Administered 2020-02-03: 3 [IU] via SUBCUTANEOUS
  Administered 2020-02-04: 2 [IU] via SUBCUTANEOUS
  Administered 2020-02-04 – 2020-02-05 (×3): 3 [IU] via SUBCUTANEOUS
  Administered 2020-02-05: 5 [IU] via SUBCUTANEOUS
  Administered 2020-02-06: 3 [IU] via SUBCUTANEOUS
  Administered 2020-02-06 (×2): 2 [IU] via SUBCUTANEOUS
  Administered 2020-02-06: 3 [IU] via SUBCUTANEOUS
  Administered 2020-02-06 – 2020-02-07 (×2): 2 [IU] via SUBCUTANEOUS
  Administered 2020-02-07 (×2): 3 [IU] via SUBCUTANEOUS
  Administered 2020-02-07: 2 [IU] via SUBCUTANEOUS
  Administered 2020-02-07 – 2020-02-08 (×2): 5 [IU] via SUBCUTANEOUS
  Administered 2020-02-08: 3 [IU] via SUBCUTANEOUS
  Administered 2020-02-09 (×2): 5 [IU] via SUBCUTANEOUS
  Administered 2020-02-09: 3 [IU] via SUBCUTANEOUS
  Administered 2020-02-09 – 2020-02-10 (×3): 5 [IU] via SUBCUTANEOUS
  Administered 2020-02-10 – 2020-02-11 (×3): 3 [IU] via SUBCUTANEOUS
  Administered 2020-02-11 (×2): 5 [IU] via SUBCUTANEOUS
  Administered 2020-02-11 (×2): 3 [IU] via SUBCUTANEOUS
  Administered 2020-02-11: 5 [IU] via SUBCUTANEOUS
  Administered 2020-02-12: 2 [IU] via SUBCUTANEOUS
  Administered 2020-02-12: 3 [IU] via SUBCUTANEOUS

## 2020-01-31 MED ORDER — PROMETHAZINE HCL 25 MG/ML IJ SOLN: 6.2500 mg | INTRAMUSCULAR | Status: DC | PRN

## 2020-01-31 MED ORDER — BACITRACIN ZINC 500 UNIT/GM EX OINT
TOPICAL_OINTMENT | CUTANEOUS | Status: DC | PRN
  Administered 2020-01-31 (×2): 1 via TOPICAL

## 2020-01-31 MED ORDER — ONDANSETRON HCL 4 MG/2ML IJ SOLN
4.0000 mg | INTRAMUSCULAR | Status: DC | PRN
  Administered 2020-02-01 – 2020-02-04 (×2): 4 mg via INTRAVENOUS
  Filled 2020-01-31 (×2): qty 2

## 2020-01-31 MED ORDER — CEFAZOLIN SODIUM 1 G IJ SOLR
INTRAMUSCULAR | Status: AC
  Filled 2020-01-31: qty 20

## 2020-01-31 MED ORDER — NOREPINEPHRINE 4 MG/250ML-% IV SOLN
2.0000 ug/min | INTRAVENOUS | Status: DC
  Administered 2020-01-31: 2 ug/min via INTRAVENOUS

## 2020-01-31 MED ORDER — MAGNESIUM CITRATE PO SOLN: 1.0000 | Freq: Once | ORAL | Status: DC | PRN

## 2020-01-31 MED ORDER — HEMOSTATIC AGENTS (NO CHARGE) OPTIME
TOPICAL | Status: DC | PRN
  Administered 2020-01-31: 1 via TOPICAL

## 2020-01-31 MED ORDER — THROMBIN 5000 UNITS EX SOLR
CUTANEOUS | Status: AC
  Filled 2020-01-31: qty 5000

## 2020-01-31 MED ORDER — VASOPRESSIN 20 UNIT/ML IV SOLN
INTRAVENOUS | Status: DC | PRN
  Administered 2020-01-31: .03 [IU]/min via INTRAVENOUS

## 2020-01-31 SURGICAL SUPPLY — 67 items
BAND RUBBER #18 3X1/16 STRL (MISCELLANEOUS) ×6 IMPLANT
BLADE CLIPPER SURG (BLADE) ×3 IMPLANT
BUR ACORN 6.0 PRECISION (BURR) IMPLANT
BUR ACORN 6.0MM PRECISION (BURR)
BUR MATCHSTICK NEURO 3.0 LAGG (BURR) IMPLANT
BUR SPIRAL ROUTER 2.3 (BUR) IMPLANT
BUR SPIRAL ROUTER 2.3MM (BUR)
CANISTER SUCT 3000ML PPV (MISCELLANEOUS) ×15 IMPLANT
CARTRIDGE OIL MAESTRO DRILL (MISCELLANEOUS) ×1 IMPLANT
CNTNR URN SCR LID CUP LEK RST (MISCELLANEOUS) ×3 IMPLANT
CONT SPEC 4OZ STRL OR WHT (MISCELLANEOUS) ×6
DEVICE DISSECT PLASMABLAD 3.0S (MISCELLANEOUS) ×1 IMPLANT
DIFFUSER DRILL AIR PNEUMATIC (MISCELLANEOUS) ×3 IMPLANT
DRAPE NEUROLOGICAL W/INCISE (DRAPES) ×3 IMPLANT
DRAPE WARM FLUID 44X44 (DRAPES) ×3 IMPLANT
DRSG OPSITE 4X5.5 SM (GAUZE/BANDAGES/DRESSINGS) ×9 IMPLANT
ELECT REM PT RETURN 9FT ADLT (ELECTROSURGICAL) ×6
ELECTRODE REM PT RTRN 9FT ADLT (ELECTROSURGICAL) ×2 IMPLANT
EVACUATOR 1/8 PVC DRAIN (DRAIN) ×3 IMPLANT
FORCEPS BIPOLAR SPETZLER 8 1.0 (NEUROSURGERY SUPPLIES) ×3 IMPLANT
GLOVE BIOGEL PI IND STRL 7.5 (GLOVE) ×3 IMPLANT
GLOVE BIOGEL PI INDICATOR 7.5 (GLOVE) ×6
GLOVE ECLIPSE 6.5 STRL STRAW (GLOVE) ×6 IMPLANT
GLOVE ECLIPSE 7.5 STRL STRAW (GLOVE) ×6 IMPLANT
GLOVE SS N UNI LF 7.0 STRL (GLOVE) ×9 IMPLANT
GOWN STRL REUS W/ TWL LRG LVL3 (GOWN DISPOSABLE) ×3 IMPLANT
GOWN STRL REUS W/ TWL XL LVL3 (GOWN DISPOSABLE) ×1 IMPLANT
GOWN STRL REUS W/TWL 2XL LVL3 (GOWN DISPOSABLE) IMPLANT
GOWN STRL REUS W/TWL LRG LVL3 (GOWN DISPOSABLE) ×6
GOWN STRL REUS W/TWL XL LVL3 (GOWN DISPOSABLE) ×2
GRAFT DURAGEN MATRIX 3WX3L (Graft) ×2 IMPLANT
GRAFT DURAGEN MATRIX 3X3 SNGL (Graft) ×1 IMPLANT
HEMOSTAT POWDER KIT SURGIFOAM (HEMOSTASIS) ×3 IMPLANT
HEMOSTAT SURGICEL 2X14 (HEMOSTASIS) ×3 IMPLANT
HOOK DURA 1/2IN (MISCELLANEOUS) ×3 IMPLANT
KIT BASIN OR (CUSTOM PROCEDURE TRAY) ×3 IMPLANT
KIT DRAIN CSF ACCUDRAIN (MISCELLANEOUS) IMPLANT
KIT TURNOVER KIT B (KITS) ×3 IMPLANT
NEEDLE HYPO 25X1 1.5 SAFETY (NEEDLE) ×3 IMPLANT
NEEDLE SPNL 18GX3.5 QUINCKE PK (NEEDLE) IMPLANT
NS IRRIG 1000ML POUR BTL (IV SOLUTION) ×3 IMPLANT
OIL CARTRIDGE MAESTRO DRILL (MISCELLANEOUS) ×3
PACK CRANIOTOMY CUSTOM (CUSTOM PROCEDURE TRAY) ×3 IMPLANT
PATTIES SURGICAL .25X.25 (GAUZE/BANDAGES/DRESSINGS) IMPLANT
PATTIES SURGICAL .5 X.5 (GAUZE/BANDAGES/DRESSINGS) IMPLANT
PATTIES SURGICAL .5 X3 (DISPOSABLE) IMPLANT
PATTIES SURGICAL 1/4 X 3 (GAUZE/BANDAGES/DRESSINGS) IMPLANT
PATTIES SURGICAL 1X1 (DISPOSABLE) IMPLANT
PLASMABLADE 3.0S (MISCELLANEOUS) ×3
SPONGE NEURO XRAY DETECT 1X3 (DISPOSABLE) IMPLANT
SPONGE SURGIFOAM ABS GEL 100 (HEMOSTASIS) ×9 IMPLANT
STAPLER VISISTAT 35W (STAPLE) ×3 IMPLANT
SUT BONE WAX W31G (SUTURE) ×3 IMPLANT
SUT ETHILON 2 0 PSLX (SUTURE) ×6 IMPLANT
SUT ETHILON 3 0 FSLX (SUTURE) ×6 IMPLANT
SUT NURALON 4 0 TR CR/8 (SUTURE) ×9 IMPLANT
SUT SILK 0 TIES 10X30 (SUTURE) IMPLANT
SUT STEEL 0 (SUTURE)
SUT STEEL 0 18XMFL TIE 17 (SUTURE) IMPLANT
SUT VIC AB 2-0 CT2 18 VCP726D (SUTURE) ×6 IMPLANT
TOWEL GREEN STERILE (TOWEL DISPOSABLE) ×3 IMPLANT
TOWEL GREEN STERILE FF (TOWEL DISPOSABLE) ×3 IMPLANT
TRAY FOLEY MTR SLVR 16FR STAT (SET/KITS/TRAYS/PACK) ×3 IMPLANT
TUBE CONNECTING 12'X1/4 (SUCTIONS) ×1
TUBE CONNECTING 12X1/4 (SUCTIONS) ×2 IMPLANT
UNDERPAD 30X36 HEAVY ABSORB (UNDERPADS AND DIAPERS) ×3 IMPLANT
WATER STERILE IRR 1000ML POUR (IV SOLUTION) ×3 IMPLANT

## 2020-01-31 NOTE — Progress Notes (Signed)
PCCM INTERVAL PROGRESS NOTE   Called for hypotension. SBP 70s-80s. He was quite agitated and sedation was increased by RN leading to sedation, which seems necessary to achieve desired RASS. STAT CBC sent to ensure does not need transfusion. Repeat hemoglobin 9.6 down from 10.2. Repeat scheduled for AM labs.   Will start levophed peripheral dose.   Harold Torres, AGACNP-BC [place]  See Amion for personal pager PCCM on call pager [phone]  01/31/2020 9:09 PM

## 2020-01-31 NOTE — Anesthesia Postprocedure Evaluation (Signed)
Anesthesia Post Note  Patient: Harold Torres  Procedure(s) Performed: OCCIPITAL CRANIOTOMY FOR MASS (Bilateral Head)     Patient location during evaluation: SICU Anesthesia Type: General Level of consciousness: sedated Pain management: pain level controlled Vital Signs Assessment: post-procedure vital signs reviewed and stable Respiratory status: patient remains intubated per anesthesia plan Cardiovascular status: stable Postop Assessment: no apparent nausea or vomiting Anesthetic complications: no Comments: Significant blood loss during the procedure. MTP triggered intraoperatively. Multiple episodes of significant hypotension but radial pulse present at all times. See intraop record for more details.    Will continue to monitor. Normal TEG prior to transfer to ICU.    No complications documented.  Last Vitals:  Vitals:   01/31/20 1445 01/31/20 1500  BP: (!) 123/89 113/83  Pulse: 66 69  Resp: 17 18  Temp:    SpO2: 100% 100%    Last Pain:  Vitals:   01/31/20 1415  TempSrc: Axillary  PainSc:                  Harold Torres

## 2020-01-31 NOTE — Transfer of Care (Signed)
Immediate Anesthesia Transfer of Care Note  Patient: Harold Torres  Procedure(s) Performed: OCCIPITAL CRANIOTOMY FOR MASS (Bilateral Head)  Patient Location: NICU (neuro)  Anesthesia Type:General  Level of Consciousness: sedated, unresponsive and Patient remains intubated per anesthesia plan  Airway & Oxygen Therapy: Patient remains intubated per anesthesia plan and Patient placed on Ventilator (see vital sign flow sheet for setting)  Post-op Assessment: Report given to RN and Post -op Vital signs reviewed and stable  Post vital signs: Reviewed and stable  Last Vitals:  Vitals Value Taken Time  BP 139/75 01/31/20 1312  Temp    Pulse 66 01/31/20 1312  Resp 18 01/31/20 1312  SpO2 100 % 01/31/20 1312    Last Pain:  Vitals:   01/31/20 0706  TempSrc:   PainSc: 0-No pain      Patients Stated Pain Goal: 3 (11/26/89 0289)  Complications: No complications documented.

## 2020-01-31 NOTE — Anesthesia Procedure Notes (Signed)
Arterial Line Insertion Start/End7/29/2021 7:10 AM, 01/31/2020 7:15 AM Performed by: Leonor Liv, CRNA  Patient location: Pre-op. Preanesthetic checklist: patient identified, IV checked, site marked, risks and benefits discussed, surgical consent, monitors and equipment checked, pre-op evaluation, timeout performed and anesthesia consent Lidocaine 1% used for infiltration radial was placed Catheter size: 20 Fr Hand hygiene performed , maximum sterile barriers used  and Seldinger technique used  Attempts: 1 Procedure performed without using ultrasound guided technique. Following insertion, dressing applied and Biopatch. Post procedure assessment: normal and unchanged  Patient tolerated the procedure well with no immediate complications. Additional procedure comments: Placed by D. [place], [place].

## 2020-01-31 NOTE — Progress Notes (Signed)
[place] Paged MD Christian to make aware of MAP 60-65 and dropping, MD to bedside. Order placed for 2L LR bolus.  1845 MAP still maintaining ~65, MD Cabbell on unit made aware. Order obtained for fluid rate increased to 163ml/hr.

## 2020-01-31 NOTE — Addendum Note (Signed)
Addendum  created 01/31/20 1551 by [name], MD   Intraprocedure Event edited

## 2020-01-31 NOTE — Anesthesia Procedure Notes (Signed)
Procedure Name: Intubation Date/Time: 01/31/2020 7:41 AM Performed by: Leonor Liv, CRNA Pre-anesthesia Checklist: Patient identified, Emergency Drugs available, Suction available and Patient being monitored Patient Re-evaluated:Patient Re-evaluated prior to induction Oxygen Delivery Method: Circle System Utilized Preoxygenation: Pre-oxygenation with 100% oxygen Induction Type: IV induction Ventilation: Mask ventilation without difficulty Laryngoscope Size: Mac and 3 Grade View: Grade I Tube type: Oral Tube size: 7.5 mm Number of attempts: 1 Airway Equipment and Method: Stylet and Oral airway Placement Confirmation: ETT inserted through vocal cords under direct vision,  positive ETCO2 and breath sounds checked- equal and bilateral Secured at: 22 cm Tube secured with: Tape Dental Injury: Teeth and Oropharynx as per pre-operative assessment  Comments: Placed by D. [place], [place]

## 2020-01-31 NOTE — Anesthesia Preprocedure Evaluation (Addendum)
Anesthesia Evaluation  Patient identified by MRN, date of birth, ID band Patient awake    Reviewed: Allergy & Precautions, NPO status , Patient's Chart, lab work & pertinent test results  Airway Mallampati: I  TM Distance: >3 FB Neck ROM: Full    Dental  (+) Teeth Intact, Dental Advisory Given   Pulmonary former smoker,    breath sounds clear to auscultation       Cardiovascular negative cardio ROS   Rhythm:Regular Rate:Normal     Neuro/Psych negative neurological ROS  negative psych ROS   GI/Hepatic negative GI ROS, (+) Hepatitis -  Endo/Other  diabetes, Type 2, Oral Hypoglycemic Agents  Renal/GU negative Renal ROS     Musculoskeletal negative musculoskeletal ROS (+)   Abdominal Normal abdominal exam  (+)   Peds  Hematology negative hematology ROS (+)   Anesthesia Other Findings   Reproductive/Obstetrics                            Anesthesia Physical Anesthesia Plan  ASA: II  Anesthesia Plan: General   Post-op Pain Management:    Induction: Intravenous  PONV Risk Score and Plan: 3 and Ondansetron, Dexamethasone and Midazolam  Airway Management Planned: Oral ETT  Additional Equipment: Arterial line  Intra-op Plan:   Post-operative Plan: Possible Post-op intubation/ventilation  Informed Consent: I have reviewed the patients History and Physical, chart, labs and discussed the procedure including the risks, benefits and alternatives for the proposed anesthesia with the patient or authorized representative who has indicated his/her understanding and acceptance.     Dental advisory given  Plan Discussed with: CRNA  Anesthesia Plan Comments: (2 large bore IV's  Consented via interpreter)       Anesthesia Quick Evaluation

## 2020-01-31 NOTE — Consult Note (Signed)
NAME:  Harold Torres, MRN:  [id number], DOB:  06-27-1976, LOS: 0 ADMISSION DATE:  01/31/2020, CONSULTATION DATE:  01/31/20 REFERRING MD:  Christella Noa, CHIEF COMPLAINT:  Tumor resection   Brief History   41yom who underwent resection of a scalp mass with erosion of the skull resulting in need for craniotomy. Procedure was complicated by severe bleeding requiring massive transfusion. Due to this, he remained intubated post-operatively and PCCM was consulted for vent management.  History of present illness   70yom with T2DM and HBV (on vemlidy) who presented for meningioma resection with Dr. Christella Noa. History obtained from chart review due to pt's critical illness.  It appears that pt initially presented to plastic surgery with a rapidly growing scalp mass. Head CT was obtained which showed "9.9 x 8.3 x 4.2 cm soft tissue mass is seen within the posterior scalp which is seen to cause significant destruction or erosion of the adjacent posterior Calvarium". He was urgently referred to neurosurgery for further management.   Past Medical History  T2DM, HBV  Significant Hospital Events   7/29 Admission; tumor resection with craniotomy; massive transfusion   Consults:  PCCM  Procedures:  ETT 7/29>>  Significant Diagnostic Tests:  n/a  Micro Data:  n/a  Antimicrobials:  n/a   Interim history/subjective:  See above  Objective   Blood pressure (!) 127/92, pulse 79, temperature 97.6 F (36.4 C), temperature source Axillary, resp. rate 12, height 5\' 5"  (1.651 m), weight 85.3 kg, SpO2 100 %.    Vent Mode: PRVC FiO2 (%):  [100 %] 100 % Set Rate:  [18 bmp] 18 bmp Vt Set:  [490 mL] 490 mL PEEP:  [5 cmH20] 5 cmH20 Plateau Pressure:  [15 cmH20] 15 cmH20   Intake/Output Summary (Last 24 hours) at 01/31/2020 1425 Last data filed at 01/31/2020 1224 Gross per 24 hour  Intake 12491 ml  Output 10700 ml  Net 1791 ml   Filed Weights   01/31/20 0626  Weight: 85.3 kg    Examination: General:  critically ill appearing male HENT: ETT. Dressing to ocipital region saturated. Periorbital edema Lungs: clear bilaterally Cardiovascular: RRR Abdomen: soft. nontender Extremities: warm Neuro: sedated on propofol. PERRL.  GU: foley  Resolved Hospital Problem list   n/a  Assessment & Plan:   Meningioma s/p craniotomy and resection of mass per Dr. Cyndy Freeze 7/29.  Complicated by significant bleeding requiring massive transfusion.  Plan: management per neurosurgery --maintain neuroprotective measures --keppra for seizure prophylaxis --TEG  --monitor hgb. Transfuse for hgb <7.  Critical illness requiring mechanical ventilation and sedation Plan: --sedation with fentanyl and propofol to maintain RASS -3 to -4 --full vent support. No SBT/WUA today.  Type II DM. A1C 7.1. On 2000mg  daily of metformin at home.  Plan: SSI for now. Adjust as needed.  Hepatitis B infection (active). Viral load 410,000 as May 2021. No significant LFT derangements. Prescribed Vemlidy by PCP.  Liver fibrosis. METAVIR score F2-F3.  Plan: avoid hepatotoxins. Will discuss vemlidy availability with pharmacy.  Best practice:  Diet: NPO Pain/Anxiety/Delirium protocol (if indicated): propofol, fentanyl VAP protocol (if indicated): yes DVT prophylaxis: SCDs GI prophylaxis: pepcid Glucose control: SSI Mobility: BR Code Status: Full Family Communication: per primary Disposition: ICU  Labs   CBC: Recent Labs  Lab 01/31/20 0614 01/31/20 0857 01/31/20 1112 01/31/20 1124 01/31/20 1149 01/31/20 1202 01/31/20 1253  WBC 6.0  --   --   --   --   --   --   HGB 15.0   < >  5.1* 6.1* 8.5* 8.2* 9.5*  HCT 46.0   < > 15.0* 18.0* 25.0* 24.0* 28.0*  MCV 88.8  --   --   --   --   --   --   PLT 127*  --   --   --   --   --   --    < > = values in this interval not displayed.    Basic Metabolic Panel: Recent Labs  Lab 01/31/20 0614 01/31/20 0857 01/31/20 1112 01/31/20 1124 01/31/20 1149 01/31/20 1202  01/31/20 1253  NA 139   < > 149* 147* 146* 145 147*  K 3.8   < > 2.9* 3.8 3.8 3.5 3.6  CL 105  --   --  104  --  106  --   CO2 24  --   --   --   --   --   --   GLUCOSE 124*  --   --  367*  --  357*  --   BUN 25*  --   --  24*  --  21*  --   CREATININE 0.84  --   --  0.90  --  0.80  --   CALCIUM 8.9  --   --   --   --   --   --    < > = values in this interval not displayed.   GFR: Estimated Creatinine Clearance: 120.8 mL/min (by C-G formula based on SCr of 0.8 mg/dL). Recent Labs  Lab 01/31/20 0614  WBC 6.0    Liver Function Tests: Recent Labs  Lab 01/31/20 0614  AST 32  ALT 45*  ALKPHOS 55  BILITOT 0.7  PROT 7.9  ALBUMIN 3.3*   No results for input(s): LIPASE, AMYLASE in the last 168 hours. No results for input(s): AMMONIA in the last 168 hours.  ABG    Component Value Date/Time   PHART 7.364 01/31/2020 1253   PCO2ART 40.1 01/31/2020 1253   PO2ART 98 01/31/2020 1253   HCO3 23.3 01/31/2020 1253   TCO2 25 01/31/2020 1253   ACIDBASEDEF 2.0 01/31/2020 1253   O2SAT 98.0 01/31/2020 1253     Coagulation Profile: No results for input(s): INR, PROTIME in the last 168 hours.  Cardiac Enzymes: No results for input(s): CKTOTAL, CKMB, CKMBINDEX, TROPONINI in the last 168 hours.  HbA1C: Hgb A1c MFr Bld  Date/Time Value Ref Range Status  01/31/2020 06:14 AM 7.1 (H) 4.8 - 5.6 % Final    Comment:    (NOTE) Pre diabetes:          5.7%-6.4%  Diabetes:              >6.4%  Glycemic control for   <7.0% adults with diabetes     CBG: Recent Labs  Lab 01/31/20 0624  GLUCAP 123*    Review of Systems:   Unable to be obtained due to critical illness  Past Medical History  He,  has a past medical history of Diabetes mellitus without complication ([place]), Generalized skin cysts, Hepatitis, and Meningioma ([place]).   Surgical History   History reviewed. No pertinent surgical history.   Social History   reports that he has quit smoking. His smoking use included  cigarettes. He has never used smokeless tobacco. He reports that he does not drink alcohol and does not use drugs.   Family History   His family history is not on file.   Allergies No Known Allergies   Home Medications  Prior  to Admission medications   Medication Sig Start Date End Date Taking? Authorizing Provider  metFORMIN (GLUCOPHAGE-XR) 500 MG 24 hr tablet Take 2,000 mg by mouth daily with breakfast.  11/08/19  Yes [provider]     Mitzi Hansen, MD Internal Medicine Resident PGY-2 [facility] Pager: [phone] 01/31/2020 2:25 PM

## 2020-01-31 NOTE — H&P (Signed)
BP 121/81   Pulse 65   Temp 97.7 F (36.5 C) (Temporal)   Resp 18   Ht 5\' 5"  (1.651 m)   Wt 85.3 kg   SpO2 97%   BMI 31.28 kg/m  Harold Torres presents with a large mass underneath the scalp which has eroded through the skull and is adjacent to the brain.  No Known Allergies Past Medical History:  Diagnosis Date  . Diabetes mellitus without complication ([place])   . Generalized skin cysts    back x 2, left leg, hand  . Hepatitis    Hepatitis B  . Meningioma [facility])    History reviewed. No pertinent surgical history. History reviewed. No pertinent family history. Social History   Socioeconomic History  . Marital status: Married    Spouse name: Not on file  . Number of children: Not on file  . Years of education: Not on file  . Highest education level: Not on file  Occupational History  . Not on file  Tobacco Use  . Smoking status: Former Smoker    Types: Cigarettes  . Smokeless tobacco: Never Used  . Tobacco comment: at age [age]  Vaping Use  . Vaping Use: Never used  Substance and Sexual Activity  . Alcohol use: Never  . Drug use: Never  . Sexual activity: Yes  Other Topics Concern  . Not on file  Social History Narrative  . Not on file   Social Determinants of Health   Financial Resource Strain:   . Difficulty of Paying Living Expenses:   Food Insecurity:   . Worried About Charity fundraiser in the Last Year:   . Arboriculturist in the Last Year:   Transportation Needs:   . Film/video editor (Medical):   Marland Kitchen Lack of Transportation (Non-Medical):   Physical Activity:   . Days of Exercise per Week:   . Minutes of Exercise per Session:   Stress:   . Feeling of Stress :   Social Connections:   . Frequency of Communication with Friends and Family:   . Frequency of Social Gatherings with Friends and Family:   . Attends Religious Services:   . Active Member of Clubs or Organizations:   . Attends Archivist Meetings:   Marland Kitchen Marital Status:   Intimate  Partner Violence:   . Fear of Current or Ex-Partner:   . Emotionally Abused:   Marland Kitchen Physically Abused:   . Sexually Abused:    Prior to Admission medications   Medication Sig Start Date End Date Taking? Authorizing Provider  metFORMIN (GLUCOPHAGE-XR) 500 MG 24 hr tablet Take 2,000 mg by mouth daily with breakfast.  11/08/19  Yes [provider]   Physical Exam Constitutional:      Appearance: Normal appearance.  HENT:     Head:     Comments: Large non sessile mass at the vertex    Right Ear: Tympanic membrane, ear canal and external ear normal.     Left Ear: Tympanic membrane, ear canal and external ear normal.     Nose: Nose normal.     Mouth/Throat:     Mouth: Mucous membranes are moist.     Pharynx: Oropharynx is clear.  Eyes:     Extraocular Movements: Extraocular movements intact.     Conjunctiva/sclera: Conjunctivae normal.     Pupils: Pupils are equal, round, and reactive to light.  Cardiovascular:     Rate and Rhythm: Normal rate and regular rhythm.  Pulses: Normal pulses.  Pulmonary:     Effort: Pulmonary effort is normal.     Breath sounds: Normal breath sounds.  Abdominal:     General: Abdomen is flat.     Palpations: Abdomen is soft.  Musculoskeletal:        General: Normal range of motion.     Cervical back: Normal range of motion.  Skin:    General: Skin is warm and dry.  Neurological:     General: No focal deficit present.     Mental Status: He is alert and oriented to person, place, and time.     Cranial Nerves: Cranial nerves are intact.     Sensory: Sensation is intact.     Motor: Motor function is intact.     Coordination: Coordination is intact.     Deep Tendon Reflexes: Reflexes are normal and symmetric.     Reflex Scores:      Tricep reflexes are 2+ on the right side and 2+ on the left side.      Bicep reflexes are 2+ on the right side and 2+ on the left side.      Brachioradialis reflexes are 2+ on the right side and 2+ on the left  side.      Patellar reflexes are 2+ on the right side and 2+ on the left side.      Achilles reflexes are 2+ on the right side and 2+ on the left side.  OR for tumor resection. 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 Risks including bleeding infection, stroke coma death brain damage, weakness, venous infarction, inability to resect mass, and other risks were discussed. He understands and wishes to proceed. The history and explanation were performed with an interpreter.

## 2020-01-31 NOTE — Op Note (Signed)
01/31/2020  2:07 PM  PATIENT:  Harold Torres  44 y.o. male  PRE-OPERATIVE DIAGNOSIS:  MENINGIOMA  POST-OPERATIVE DIAGNOSIS:  Hemangiopericytoma  PROCEDURE:  Procedure(s): OCCIPITAL CRANIOTOMY FOR MASS, Stage 1  SURGEON: Surgeon(s): Ashok Pall, MD Vallarie Mare, MD  ASSISTANTS:thomas, jonathan  ANESTHESIA:   general  EBL:  Total I/O In: 27782 [I.V.:2500; UMPNT:6144; IV Piggyback:1250] Out: 31540 [Urine:650; Blood:10050]  BLOOD ADMINISTERED:16 CC PRBC, 9 FFP and 2 PLTS Cryoprecipitate CELL SAVER GIVEN:none  COUNT:per nursing  DRAINS: (1) Hemovact drain(s) in the subgaleal with  Suction Open   SPECIMEN:  Intra and extracranial mass  DICTATION: Harold Torres was taken to the operating room, intubated, and placed under a general anesgthetic without difficulty. A foley catheter was placed under sterile conditions.  Once adequate anesthesia was obtained a three pin head holder was placed.  He was positioned prone on the operating room table. His head was attached to the [place] head holder attachment. His head was shaved, was prepped and was draped in a sterile manner.  I opened the scalp flap with a 10 blade and was met by vigorous bleeding from the tumor. Essentially this describes the first third of the case. The tumor was extremely vascular and fibrous on its periphery. Each bit of tumor removed was time consuming and associated with a great deal of blood loss. It was my judgement that the tumor would continue to bleed until it was removed. I informed anesthesia of the blood loss and they dutifully transfused multiple blood products during the entirety of the case. [name] pressure did drop quite low during the case, and he was resuscitated continuously by the anesthesia team.  We were able to define the bony margins of the tumor. However the tumor infiltrated the skull and contributed to more blood loss. We used various hemostatic agents and bone was to control the bone  bleeding. In a methodical manner the tumor bone and dural interface was defined allowing for tumor removal. The monopolar cautery was the best and primary tool in removing the tumor.  Finally we had most of the tumor out, there was some still attached to the scalp and we removed visible tumor. The dura was cauterized , and spray thrombin used on the dura to help control the blood ooze. We trimmed the scalp remove some clearly tumor involved tissue.  The scalp was then approximated with nylon sutures. A hemovac drain was placed in the subgaleal space and brought out through the scalp.  The next stage including a cranioplasty will be performed at a later date.  We did have good hemostasis when I closed.   PLAN OF CARE: Admit to inpatient   PATIENT DISPOSITION:  ICU - intubated and hemodynamically stable.   Delay start of Pharmacological VTE agent (>24hrs) due to surgical blood loss or risk of bleeding:  yes

## 2020-02-01 ENCOUNTER — Inpatient Hospital Stay (HOSPITAL_COMMUNITY): Payer: 59

## 2020-02-01 ENCOUNTER — Encounter (HOSPITAL_COMMUNITY): Payer: Self-pay | Admitting: Neurosurgery

## 2020-02-01 DIAGNOSIS — B169 Acute hepatitis B without delta-agent and without hepatic coma: Secondary | ICD-10-CM

## 2020-02-01 DIAGNOSIS — Z978 Presence of other specified devices: Secondary | ICD-10-CM

## 2020-02-01 LAB — PREPARE CRYOPRECIPITATE: Unit division: 0

## 2020-02-01 LAB — PREPARE FRESH FROZEN PLASMA
Unit division: 0
Unit division: 0
Unit division: 0
Unit division: 0
Unit division: 0
Unit division: 0
Unit division: 0
Unit division: 0
Unit division: 0
Unit division: 0
Unit division: 0
Unit division: 0

## 2020-02-01 LAB — GLUCOSE, CAPILLARY
Glucose-Capillary: 109 mg/dL — ABNORMAL HIGH (ref 70–99)
Glucose-Capillary: 112 mg/dL — ABNORMAL HIGH (ref 70–99)
Glucose-Capillary: 123 mg/dL — ABNORMAL HIGH (ref 70–99)
Glucose-Capillary: 118 mg/dL — ABNORMAL HIGH (ref 70–99)
Glucose-Capillary: 118 mg/dL — ABNORMAL HIGH (ref 70–99)
Glucose-Capillary: 88 mg/dL (ref 70–99)

## 2020-02-01 LAB — BPAM FFP
Blood Product Expiration Date: 202108012359
Blood Product Expiration Date: 202108012359
Blood Product Expiration Date: 202108012359
Blood Product Expiration Date: 202108012359
Blood Product Expiration Date: 202108032359
Blood Product Expiration Date: 202108032359
Blood Product Expiration Date: 202108032359
Blood Product Expiration Date: 202108032359
Blood Product Expiration Date: 202108032359
Blood Product Expiration Date: 202108032359
Blood Product Expiration Date: 202108032359
Blood Product Expiration Date: 202108032359
Blood Product Expiration Date: 202108032359
Blood Product Expiration Date: 202108032359
Blood Product Expiration Date: 202108032359
Blood Product Expiration Date: 202108032359
ISSUE DATE / TIME: 202107291017
ISSUE DATE / TIME: 202107291017
ISSUE DATE / TIME: 202107291017
ISSUE DATE / TIME: 202107291017
ISSUE DATE / TIME: 202107291050
ISSUE DATE / TIME: 202107291050
ISSUE DATE / TIME: 202107291050
ISSUE DATE / TIME: 202107291050
ISSUE DATE / TIME: 202107291127
ISSUE DATE / TIME: 202107291127
ISSUE DATE / TIME: 202107291127
ISSUE DATE / TIME: 202107291127
ISSUE DATE / TIME: 202107300510
Unit Type and Rh: 1700
Unit Type and Rh: 6200
Unit Type and Rh: 6200
Unit Type and Rh: 6200
Unit Type and Rh: 6200
Unit Type and Rh: 7300
Unit Type and Rh: 7300
Unit Type and Rh: 7300
Unit Type and Rh: 7300
Unit Type and Rh: 7300
Unit Type and Rh: 7300
Unit Type and Rh: 7300
Unit Type and Rh: 7300
Unit Type and Rh: 7300
Unit Type and Rh: 7300
Unit Type and Rh: 7300

## 2020-02-01 LAB — POCT I-STAT 7, (LYTES, BLD GAS, ICA,H+H)
Acid-base deficit: 18 mmol/L — ABNORMAL HIGH (ref 0.0–2.0)
Bicarbonate: 12.7 mmol/L — ABNORMAL LOW (ref 20.0–28.0)
Calcium, Ion: 1.31 mmol/L (ref 1.15–1.40)
HCT: 30 % — ABNORMAL LOW (ref 39.0–52.0)
Hemoglobin: 10.2 g/dL — ABNORMAL LOW (ref 13.0–17.0)
O2 Saturation: 100 %
Patient temperature: 35.2
Potassium: 3.5 mmol/L (ref 3.5–5.1)
Sodium: 145 mmol/L (ref 135–145)
TCO2: 14 mmol/L — ABNORMAL LOW (ref 22–32)
pCO2 arterial: 45.7 mmHg (ref 32.0–48.0)
pH, Arterial: 7.039 — CL (ref 7.350–7.450)
pO2, Arterial: 406 mmHg — ABNORMAL HIGH (ref 83.0–108.0)

## 2020-02-01 LAB — BASIC METABOLIC PANEL
Anion gap: 9 (ref 5–15)
BUN: 14 mg/dL (ref 6–20)
CO2: 25 mmol/L (ref 22–32)
Calcium: 8.4 mg/dL — ABNORMAL LOW (ref 8.9–10.3)
Chloride: 109 mmol/L (ref 98–111)
Creatinine, Ser: 0.83 mg/dL (ref 0.61–1.24)
GFR calc Af Amer: >60 mL/min (ref >60)
GFR calc non Af Amer: >60 mL/min (ref >60)
Glucose, Bld: 135 mg/dL — ABNORMAL HIGH (ref 70–99)
Potassium: 3.6 mmol/L (ref 3.5–5.1)
Sodium: 143 mmol/L (ref 135–145)

## 2020-02-01 LAB — BPAM CRYOPRECIPITATE
Blood Product Expiration Date: 202107291646
ISSUE DATE / TIME: 202107291110
Unit Type and Rh: 5100

## 2020-02-01 LAB — PREPARE PLATELET PHERESIS
Unit division: 0
Unit division: 0

## 2020-02-01 LAB — TRIGLYCERIDES: Triglycerides: 96 mg/dL (ref <150)

## 2020-02-01 LAB — BPAM PLATELET PHERESIS
Blood Product Expiration Date: 202107302359
Blood Product Expiration Date: 202107302359
ISSUE DATE / TIME: 202107291055
ISSUE DATE / TIME: 202107291205
Unit Type and Rh: 1700
Unit Type and Rh: 6200

## 2020-02-01 LAB — CBC
HCT: 26.1 % — ABNORMAL LOW (ref 39.0–52.0)
Hemoglobin: 9.3 g/dL — ABNORMAL LOW (ref 13.0–17.0)
MCH: 30.5 pg (ref 26.0–34.0)
MCHC: 35.6 g/dL (ref 30.0–36.0)
MCV: 85.6 fL (ref 80.0–100.0)
Platelets: 71 10*3/uL — ABNORMAL LOW (ref 150–400)
RBC: 3.05 MIL/uL — ABNORMAL LOW (ref 4.22–5.81)
RDW: 14.8 % (ref 11.5–15.5)
WBC: 11.9 10*3/uL — ABNORMAL HIGH (ref 4.0–10.5)
nRBC: 0 % (ref 0.0–0.2)

## 2020-02-01 LAB — SURGICAL PATHOLOGY

## 2020-02-01 MED ORDER — ACETAMINOPHEN 325 MG PO TABS
650.0000 mg | ORAL_TABLET | ORAL | Status: DC | PRN
  Administered 2020-02-01: 650 mg

## 2020-02-01 MED ORDER — ACETAMINOPHEN 325 MG PO TABS: 650.0000 mg | ORAL_TABLET | Freq: Four times a day (QID) | ORAL | Status: DC

## 2020-02-01 MED ORDER — ACETAMINOPHEN 650 MG RE SUPP: 650.0000 mg | Freq: Four times a day (QID) | RECTAL | Status: DC | PRN

## 2020-02-01 MED ORDER — ACETAMINOPHEN 650 MG RE SUPP: 650.0000 mg | Freq: Four times a day (QID) | RECTAL | Status: DC

## 2020-02-01 MED ORDER — ACETAMINOPHEN 325 MG PO TABS
650.0000 mg | ORAL_TABLET | Freq: Four times a day (QID) | ORAL | Status: DC | PRN
  Administered 2020-02-01 – 2020-02-06 (×2): 650 mg
  Filled 2020-02-01 (×2): qty 2

## 2020-02-01 MED ORDER — POTASSIUM CHLORIDE IN NACL 20-0.9 MEQ/L-% IV SOLN
INTRAVENOUS | Status: DC
  Filled 2020-02-01 (×3): qty 1000

## 2020-02-01 MED ORDER — ACETAMINOPHEN 650 MG RE SUPP: 650.0000 mg | RECTAL | Status: DC | PRN

## 2020-02-01 MED FILL — Thrombin For Soln 20000 Unit: CUTANEOUS | Qty: 1 | Status: AC

## 2020-02-01 NOTE — Progress Notes (Signed)
NAME:  Jaidyn Kuhl, MRN:  [id number], DOB:  02-28-1976, LOS: 1 ADMISSION DATE:  01/31/2020, CONSULTATION DATE:  01/31/20 REFERRING MD:  Christella Noa, CHIEF COMPLAINT:  Tumor resection   Brief History   19yom who underwent resection of a scalp mass with erosion of the skull resulting in need for craniotomy. Procedure was complicated by severe bleeding requiring massive transfusion. Due to this, he remained intubated post-operatively and PCCM was consulted for vent management.  History of present illness   33yom with T2DM and HBV (on vemlidy) who presented for meningioma resection with Dr. Christella Noa. History obtained from chart review due to pt's critical illness.  It appears that pt initially presented to plastic surgery with a rapidly growing scalp mass. Head CT was obtained which showed "9.9 x 8.3 x 4.2 cm soft tissue mass is seen within the posterior scalp which is seen to cause significant destruction or erosion of the adjacent posterior Calvarium". He was urgently referred to neurosurgery for further management.  Past Medical History  T2DM, HBV  Significant Hospital Events   7/29 Admission; tumor resection with craniotomy; massive transfusion   Consults:  PCCM  Procedures:  ETT 7/29>>  Significant Diagnostic Tests:  n/a  Micro Data:  n/a  Antimicrobials:  n/a   Interim history/subjective:  Blood pressures improving since last evening.   Objective   Blood pressure (!) 93/59, pulse 64, temperature 98.9 F (37.2 C), temperature source Oral, resp. rate 20, height 5\' 5"  (1.651 m), weight 85.3 kg, SpO2 100 %.    Vent Mode: PRVC FiO2 (%):  [40 %-100 %] 40 % Set Rate:  [18 bmp-20 bmp] 20 bmp Vt Set:  [490 mL] 490 mL PEEP:  [5 cmH20] 5 cmH20 Plateau Pressure:  [11 cmH20-15 cmH20] 14 cmH20   Intake/Output Summary (Last 24 hours) at 02/01/2020 0712 Last data filed at 02/01/2020 0700 Gross per 24 hour  Intake 17276.87 ml  Output 14705 ml  Net 2571.87 ml   Filed Weights    01/31/20 0626  Weight: 85.3 kg    Examination: General: critically ill appearing male HENT: ETT. Dressing to ocipital region saturated.  Lungs: clear bilaterally Cardiovascular: RRR. No LE edema Abdomen: soft. Nontender. bs active Extremities: warm Neuro: on fentanyl and propofol.  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 Cyndy Freeze 7/29.  Complicated by significant intraoperative bleeding requiring massive transfusion. hgb stable this morning. Plan: management per neurosurgery --5mg  norco q6h prn.  --maintain neuroprotective measures --keppra for seizure prophylaxis --monitor hgb. Transfuse for hgb <7.  Critical illness requiring mechanical ventilation and sedation. On minimal vent Sedation related hypotension. Started on low dose levo gtt overnight--now on 97mcg Plan: --wean sedation. Suspect hypotension will resolve with this --SBT this morning. Suspect we will be able to extubate today  Type II DM. Glucoses within goal Plan: SSI for now. Adjust as needed.  Hepatitis B infection (active). Viral load 410,000 as May 2021. No significant LFT derangements. Prescribed Vemlidy by PCP.  Liver fibrosis. METAVIR score F2-F3.  Plan: avoid hepatotoxins. Will resume once tolerating po  Best practice:  Diet: NPO Pain/Anxiety/Delirium protocol (if indicated): propofol, fentanyl VAP protocol (if indicated): yes DVT prophylaxis: SCDs GI prophylaxis: pepcid Glucose control: SSI Mobility: BR Code Status: Full Family Communication: per primary Disposition: ICU  Labs   CBC: Recent Labs  Lab 01/31/20 0614 01/31/20 0857 01/31/20 1253 01/31/20 1456 01/31/20 1543 01/31/20 2003 02/01/20 0537  WBC 6.0  --   --  13.9*  --  10.7* 11.9*  HGB 15.0   < > 9.5* 10.8* 10.2* 9.6* 9.3*  HCT 46.0   < > 28.0* 31.1* 30.0* 27.5* 26.1*  MCV 88.8  --   --  86.9  --  84.6 85.6  PLT 127*  --   --   PLATELET CLUMPS NOTED ON SMEAR, UNABLE TO ESTIMATE  --  70* 71*   < > = values in this interval not displayed.    Basic Metabolic Panel: Recent Labs  Lab 01/31/20 0614 01/31/20 0857 01/31/20 1124 01/31/20 1149 01/31/20 1202 01/31/20 1202 01/31/20 1253 01/31/20 1456 01/31/20 1543 01/31/20 2003 02/01/20 0537  NA 139   < > 147*   < > 145   < > 147* 145 147* 144 143  K 3.8   < > 3.8   < > 3.5   < > 3.6 3.4* 3.4* 3.8 3.6  CL 105   < > 104  --  106  --   --  110  --  109 109  CO2 24  --   --   --   --   --   --  24  --  25 25  GLUCOSE 124*   < > 367*  --  357*  --   --  166*  --  138* 135*  BUN 25*   < > 24*  --  21*  --   --  18  --  17 14  CREATININE 0.84   < > 0.90  --  0.80  --   --  1.11  --  0.94 0.83  CALCIUM 8.9  --   --   --   --   --   --  9.8  --  8.9 8.4*  MG  --   --   --   --   --   --   --  1.6*  --   --   --    < > = values in this interval not displayed.   GFR: Estimated Creatinine Clearance: 116.4 mL/min (by C-G formula based on SCr of 0.83 mg/dL). Recent Labs  Lab 01/31/20 0614 01/31/20 1456 01/31/20 2003 02/01/20 0537  WBC 6.0 13.9* 10.7* 11.9*    Liver Function Tests: Recent Labs  Lab 01/31/20 0614 01/31/20 2003  AST 32 65*  ALT 45* 65*  ALKPHOS 55 35*  BILITOT 0.7 1.7*  PROT 7.9 4.7*  ALBUMIN 3.3* 2.7*     Mitzi Hansen, MD Internal Medicine Resident PGY-2 [facility] Pager: [phone] 02/01/2020 7:12 AM

## 2020-02-01 NOTE — Progress Notes (Signed)
Patient ID: Harold Torres, male   DOB: July 30, 1975, 44 y.o.   MRN: [id number] BP (!) 97/62   Pulse 65   Temp 99.8 F (37.7 C) (Axillary)   Resp 20   Ht 5\' 5"  (1.651 m)   Wt 85.3 kg   SpO2 99%   BMI 31.28 kg/m  Alert and oriented, follows all commands Remains intubated Perrl, full eom,, Moving all extremities Dx: rhabdoid meningioma grade 3 Wound is clean, and dry Drain remains in place

## 2020-02-01 NOTE — Progress Notes (Signed)
Patient ID: Harold Torres, male   DOB: 03-11-1976, 44 y.o.   MRN: [id number] BP (!) 93/59   Pulse 64   Temp 98.9 F (37.2 C) (Oral)   Resp 20   Ht 5\' 5"  (1.651 m)   Wt 85.3 kg   SpO2 100%   BMI 31.28 kg/m  Alert and oriented, following commands. Remains intubated Moving all extremities Wound is clean, and dry, will leave drain in place.

## 2020-02-02 LAB — CBC
HCT: 27.2 % — ABNORMAL LOW (ref 39.0–52.0)
Hemoglobin: 9.1 g/dL — ABNORMAL LOW (ref 13.0–17.0)
MCH: 29.7 pg (ref 26.0–34.0)
MCHC: 33.5 g/dL (ref 30.0–36.0)
MCV: 88.9 fL (ref 80.0–100.0)
Platelets: 62 10*3/uL — ABNORMAL LOW (ref 150–400)
RBC: 3.06 MIL/uL — ABNORMAL LOW (ref 4.22–5.81)
RDW: 14.7 % (ref 11.5–15.5)
WBC: 10.1 10*3/uL (ref 4.0–10.5)
nRBC: 0 % (ref 0.0–0.2)

## 2020-02-02 LAB — GLUCOSE, CAPILLARY
Glucose-Capillary: 91 mg/dL (ref 70–99)
Glucose-Capillary: 109 mg/dL — ABNORMAL HIGH (ref 70–99)
Glucose-Capillary: 105 mg/dL — ABNORMAL HIGH (ref 70–99)
Glucose-Capillary: 120 mg/dL — ABNORMAL HIGH (ref 70–99)
Glucose-Capillary: 122 mg/dL — ABNORMAL HIGH (ref 70–99)
Glucose-Capillary: 145 mg/dL — ABNORMAL HIGH (ref 70–99)

## 2020-02-02 LAB — BLOOD GAS, ARTERIAL
Acid-base deficit: 0.1 mmol/L (ref 0.0–2.0)
Bicarbonate: 24.1 mmol/L (ref 20.0–28.0)
Drawn by: 53527
FIO2: 40
O2 Saturation: 98.8 %
Patient temperature: 37.6
pCO2 arterial: 40.8 mmHg (ref 32.0–48.0)
pH, Arterial: 7.392 (ref 7.350–7.450)
pO2, Arterial: 138 mmHg — ABNORMAL HIGH (ref 83.0–108.0)

## 2020-02-02 LAB — BASIC METABOLIC PANEL
Anion gap: 8 (ref 5–15)
BUN: 9 mg/dL (ref 6–20)
CO2: 24 mmol/L (ref 22–32)
Calcium: 8.2 mg/dL — ABNORMAL LOW (ref 8.9–10.3)
Chloride: 106 mmol/L (ref 98–111)
Creatinine, Ser: 0.74 mg/dL (ref 0.61–1.24)
GFR calc Af Amer: >60 mL/min (ref >60)
GFR calc non Af Amer: >60 mL/min (ref >60)
Glucose, Bld: 121 mg/dL — ABNORMAL HIGH (ref 70–99)
Potassium: 3.4 mmol/L — ABNORMAL LOW (ref 3.5–5.1)
Sodium: 138 mmol/L (ref 135–145)

## 2020-02-02 LAB — PHOSPHORUS: Phosphorus: 2.8 mg/dL (ref 2.5–4.6)

## 2020-02-02 LAB — MAGNESIUM: Magnesium: 1.5 mg/dL — ABNORMAL LOW (ref 1.7–2.4)

## 2020-02-02 MED ORDER — VITAL AF 1.2 CAL PO LIQD
1000.0000 mL | ORAL | Status: DC
  Administered 2020-02-02: 1000 mL

## 2020-02-02 MED ORDER — PANTOPRAZOLE SODIUM 40 MG PO PACK
40.0000 mg | PACK | Freq: Every day | ORAL | Status: DC
  Administered 2020-02-02: 40 mg
  Filled 2020-02-02: qty 20

## 2020-02-02 MED ORDER — VITAL HIGH PROTEIN PO LIQD
1000.0000 mL | ORAL | Status: AC
  Administered 2020-02-02: 1000 mL

## 2020-02-02 MED ORDER — MAGNESIUM SULFATE 2 GM/50ML IV SOLN
2.0000 g | Freq: Once | INTRAVENOUS | Status: AC
  Administered 2020-02-02: 2 g via INTRAVENOUS
  Filled 2020-02-02: qty 50

## 2020-02-02 MED ORDER — PROSOURCE TF PO LIQD
45.0000 mL | Freq: Two times a day (BID) | ORAL | Status: DC
  Administered 2020-02-02: 45 mL
  Filled 2020-02-02: qty 45

## 2020-02-02 NOTE — Progress Notes (Signed)
NAME:  Harold Torres, MRN:  [id number], DOB:  1976-06-04, LOS: 2 ADMISSION DATE:  01/31/2020, CONSULTATION DATE: 01/31/2020 REFERRING MD: Cabbell-neurosurgery, CHIEF COMPLAINT: Respiratory failure  HPI/course in hospital  44 year old male with diabetes and hepatitis B, who underwent [place] for resection of hemangiopericytoma by neurosurgery, course complicated by several amount of bleeding requiring massive transfusion. Remained intubated after surgery. Improved hypotension.  Weaned off vasopressors Remains too somnolent for extubation.  Past Medical History   Past Medical History:  Diagnosis Date  . Diabetes mellitus without complication ([place])   . Generalized skin cysts    back x 2, left leg, hand  . Hepatitis    Hepatitis B  . Meningioma [facility])      Past Surgical History:  Procedure Laterality Date  . CRANIOTOMY Bilateral 01/31/2020   Procedure: OCCIPITAL CRANIOTOMY FOR MASS;  Surgeon: Ashok Pall, MD;  Location: [place];  Service: Neurosurgery;  Laterality: Bilateral;      Interim history/subjective:  Failed SBT again this morning due to apnea when left unstimulated.  Objective   Blood pressure (!) 134/61, pulse 66, temperature 99.8 F (37.7 C), temperature source Axillary, resp. rate 16, height 5\' 5"  (1.651 m), weight 85.3 kg, SpO2 100 %.    Vent Mode: PRVC FiO2 (%):  [40 %] 40 % Set Rate:  [16 bmp-20 bmp] 16 bmp Vt Set:  [490 mL] 490 mL PEEP:  [5 cmH20] 5 cmH20 Pressure Support:  [10 cmH20] 10 cmH20 Plateau Pressure:  [11 cmH20-15 cmH20] 11 cmH20   Intake/Output Summary (Last 24 hours) at 02/02/2020 1233 Last data filed at 02/02/2020 1100 Gross per 24 hour  Intake 2668.19 ml  Output 3050 ml  Net -381.81 ml   Filed Weights   01/31/20 0626  Weight: 85.3 kg    Examination: Physical Exam Constitutional:      Appearance: Normal appearance.     Interventions: He is intubated.  Cardiovascular:     Rate and Rhythm: Normal rate and regular rhythm.     Heart  sounds: S1 normal and S2 normal.  Pulmonary:     Effort: Pulmonary effort is normal. He is intubated.  Abdominal:     General: Abdomen is flat.     Tenderness: There is no abdominal tenderness.  Genitourinary:    Comments: External catheter in place. Neurological:     General: No focal deficit present.     Mental Status: He is lethargic.     Comments: Follows commands briskly and all 4 limbs.  Psychiatric:        Behavior: Behavior is cooperative.      Ancillary tests (personally reviewed)  CBC: Recent Labs  Lab 01/31/20 0614 01/31/20 0857 01/31/20 1253 01/31/20 1456 01/31/20 1543 01/31/20 2003 02/01/20 0537  WBC 6.0  --   --  13.9*  --  10.7* 11.9*  HGB 15.0   < > 9.5* 10.8* 10.2* 9.6* 9.3*  HCT 46.0   < > 28.0* 31.1* 30.0* 27.5* 26.1*  MCV 88.8  --   --  86.9  --  84.6 85.6  PLT 127*  --   --  PLATELET CLUMPS NOTED ON SMEAR, UNABLE TO ESTIMATE  --  70* 71*   < > = values in this interval not displayed.    Basic Metabolic Panel: Recent Labs  Lab 01/31/20 0614 01/31/20 0857 01/31/20 1124 01/31/20 1149 01/31/20 1202 01/31/20 1202 01/31/20 1253 01/31/20 1456 01/31/20 1543 01/31/20 2003 02/01/20 0537 02/02/20 0803  NA 139   < > 147*   < >  145   < > 147* 145 147* 144 143  --   K 3.8   < > 3.8   < > 3.5   < > 3.6 3.4* 3.4* 3.8 3.6  --   CL 105   < > 104  --  106  --   --  110  --  109 109  --   CO2 24  --   --   --   --   --   --  24  --  25 25  --   GLUCOSE 124*   < > 367*  --  357*  --   --  166*  --  138* 135*  --   BUN 25*   < > 24*  --  21*  --   --  18  --  17 14  --   CREATININE 0.84   < > 0.90  --  0.80  --   --  1.11  --  0.94 0.83  --   CALCIUM 8.9  --   --   --   --   --   --  9.8  --  8.9 8.4*  --   MG  --   --   --   --   --   --   --  1.6*  --   --   --  1.5*  PHOS  --   --   --   --   --   --   --   --   --   --   --  2.8   < > = values in this interval not displayed.   GFR: Estimated Creatinine Clearance: 116.4 mL/min (by C-G formula based on  SCr of 0.83 mg/dL). Recent Labs  Lab 01/31/20 0614 01/31/20 1456 01/31/20 2003 02/01/20 0537  WBC 6.0 13.9* 10.7* 11.9*    Liver Function Tests: Recent Labs  Lab 01/31/20 0614 01/31/20 2003  AST 32 65*  ALT 45* 65*  ALKPHOS 55 35*  BILITOT 0.7 1.7*  PROT 7.9 4.7*  ALBUMIN 3.3* 2.7*   No results for input(s): LIPASE, AMYLASE in the last 168 hours. No results for input(s): AMMONIA in the last 168 hours.  ABG    Component Value Date/Time   PHART 7.392 02/02/2020 0825   PCO2ART 40.8 02/02/2020 0825   PO2ART 138 (H) 02/02/2020 0825   HCO3 24.1 02/02/2020 0825   TCO2 28 01/31/2020 1543   ACIDBASEDEF 0.1 02/02/2020 0825   O2SAT 98.8 02/02/2020 0825     Coagulation Profile: Recent Labs  Lab 01/31/20 1456  INR 1.3*    Cardiac Enzymes: No results for input(s): CKTOTAL, CKMB, CKMBINDEX, TROPONINI in the last 168 hours.  HbA1C: Hgb A1c MFr Bld  Date/Time Value Ref Range Status  01/31/2020 06:14 AM 7.1 (H) 4.8 - 5.6 % Final    Comment:    (NOTE) Pre diabetes:          5.7%-6.4%  Diabetes:              >6.4%  Glycemic control for   <7.0% adults with diabetes     CBG: Recent Labs  Lab 02/01/20 1946 02/01/20 2326 02/02/20 0330 02/02/20 0817 02/02/20 1228  GLUCAP 118* 88 91 109* 105*     Assessment & Plan:   Hemangiopericytoma status post craniotomy and resection of tumor Critically ill due to acute hypoxic hypercapnic respiratory failure requiring mechanical ventilation Acute blood loss anemia status post massive transfusion  Distributive shock requiring vasopressors Diabetes type 2 Hepatitis B infection Thrombocytopenia  Plan  Patient is much more alert and awake but remains apneic on SBT I have decreased his PRVC respiratory rate to allow to overbreathing ventilator.   Daily Goals Checklist  Pain/Anxiety/Delirium protocol (if indicated): Off all sedatives VAP protocol (if indicated): Bundle in place Respiratory support goals: SBT and  extubate Blood pressure target: Allow autoregulation DVT prophylaxis: Mechanical prophylaxis for now Nutritional status and feeding goals: Tube feeds GI prophylaxis: Protonix Fluid status goals: Allow autoregulation Urinary catheter: External catheter Central lines: Arterial line only Glucose control: Euglycemic on no therapy Mobility/therapy needs: Bedrest Antibiotic de-escalation: Perioperative antibiotics completed Home medication reconciliation: On hold Daily labs: CBC BMP Code Status: Full Family Communication: We will update Disposition: ICU  Critical care time: 40 minutes    Kipp Brood, MD [facility] ICU Physician [place]  Pager: [phone] Mobile: [phone] After hours: [phone].  02/02/2020, 12:33 PM

## 2020-02-02 NOTE — Plan of Care (Signed)
  Problem: Education: Goal: Knowledge of the prescribed therapeutic regimen will improve Outcome: Progressing   Problem: Clinical Measurements: Goal: Usual level of consciousness will be regained or maintained. Outcome: Progressing Goal: Neurologic status will improve Outcome: Progressing Goal: Ability to maintain intracranial pressure will improve Outcome: Progressing   Problem: Skin Integrity: Goal: Demonstration of wound healing without infection will improve Outcome: Progressing   Problem: Health Behavior/Discharge Planning: Goal: Ability to manage health-related needs will improve Outcome: Progressing   Problem: Clinical Measurements: Goal: Will remain free from infection Outcome: Progressing Goal: Respiratory complications will improve Outcome: Progressing   Problem: Activity: Goal: Risk for activity intolerance will decrease Outcome: Progressing   Problem: Nutrition: Goal: Adequate nutrition will be maintained Outcome: Progressing   Problem: Coping: Goal: Level of anxiety will decrease Outcome: Progressing   Problem: Elimination: Goal: Will not experience complications related to bowel motility Outcome: Progressing Goal: Will not experience complications related to urinary retention Outcome: Progressing   Problem: Pain Managment: Goal: General experience of comfort will improve Outcome: Progressing   Problem: Safety: Goal: Ability to remain free from injury will improve Outcome: Progressing

## 2020-02-02 NOTE — Progress Notes (Signed)
Initial Nutrition Assessment  RD working remotely.  DOCUMENTATION CODES:   Not applicable  INTERVENTION:   Tube feeding via OG tube: - Vital AF 1.2 @ 65 ml/hr (1560 ml/day)  Tube feeding regimen provides 1872 kcal, 117 grams of protein, and 1265 ml of H2O (meets 96% of kcal needs and 100% of protein need).  NUTRITION DIAGNOSIS:   Inadequate oral intake related to inability to eat as evidenced by NPO status.  GOAL:   Provide needs based on ASPEN/SCCM guidelines  MONITOR:   Vent status, I & O's, Labs, Weight trends, TF tolerance, Skin  REASON FOR ASSESSMENT:   Ventilator, Consult Enteral/tube feeding initiation and management  ASSESSMENT:   44 year old male who presented on 729 for tumor resection. PMH of T2DM and hepatitis B.   7/29 - s/p craniotomy and resection of hemangiopericytoma  Course complicated by large amount of bleeding requiring massive transfusion. Pt remains intubated post-op. Overnight pt became hypotensive requiring vasopressor support. Pt is no longer on pressors.  Received consult for TF initiation and management. OG tube with tip in proximal stomach per abdominal x-ray.  Unable to obtain diet and weight history at this time. Reviewed weight history in chart. Pt's weight trending down over the last 2 months. Overall, pt with a 2 kg weight loss since 12/05/19. This is a 2.3% weight loss which is not significant for timeframe.  Current TF: Vital High Protein @ 40 ml/hr, ProSource TF 45 ml BID  Patient is currently intubated on ventilator support MV: 9.5 L/min Temp (24hrs), Avg:99.4 F (37.4 C), Min:98.9 F (37.2 C), Max:99.8 F (37.7 C) BP (a-line): 134/63 MAP (a-line): 82  Medications reviewed and include: colace, SSI q 4 hours, protonix, miralax, senna, Keppra  Labs reviewed: hemoglobin 9.3 CBG's: 99-123 x 24 hours  UOP: 3400 ml x 24 hours Hemovac: 125 ml x 24 hours I/O's: +2.0 L since admit  NUTRITION - FOCUSED PHYSICAL EXAM:  Unable  to complete at this time. RD working remotely.  Diet Order:   Diet Order    None      EDUCATION NEEDS:   No education needs have been identified at this time  Skin:  Skin Assessment: Skin Integrity Issues: Incisions: closed incision to head  Last BM:  no documented BM  Height:   Ht Readings from Last 1 Encounters:  01/31/20 5\' 5"  (1.651 m)    Weight:   Wt Readings from Last 1 Encounters:  01/31/20 85.3 kg    Ideal Body Weight:  61.8 kg  BMI:  Body mass index is 31.28 kg/m.  Estimated Nutritional Needs:   Kcal:  1943  Protein:  110-130 grams  Fluid:  >/= 2.0 L    Gaynell Face, MS, RD, LDN Inpatient Clinical Dietitian Please see AMiON for contact information.

## 2020-02-02 NOTE — Progress Notes (Signed)
Thereasa Parkin., witnessed Cindee Salt., RN waste 12mL of fentanyl down the sink.

## 2020-02-02 NOTE — Progress Notes (Signed)
No new issues or problems overnight.  Patient weaning slowly off the ventilator.  He is awake and aware.  Denies pain.  He is afebrile.  He is hemodynamically stable.  His urine output is good.  Opens his eyes to voice.  Appears aware.  Motor and sensory examination extremities intact.  Wound clean and dry.  Drain output moderate.  Status post craniotomy and resection of hemangiopericytoma.  (Ventilatory weaning.

## 2020-02-03 LAB — BASIC METABOLIC PANEL
Anion gap: 7 (ref 5–15)
BUN: 11 mg/dL (ref 6–20)
CO2: 26 mmol/L (ref 22–32)
Calcium: 8.3 mg/dL — ABNORMAL LOW (ref 8.9–10.3)
Chloride: 104 mmol/L (ref 98–111)
Creatinine, Ser: 0.59 mg/dL — ABNORMAL LOW (ref 0.61–1.24)
GFR calc Af Amer: >60 mL/min (ref >60)
GFR calc non Af Amer: >60 mL/min (ref >60)
Glucose, Bld: 151 mg/dL — ABNORMAL HIGH (ref 70–99)
Potassium: 3.6 mmol/L (ref 3.5–5.1)
Sodium: 137 mmol/L (ref 135–145)

## 2020-02-03 LAB — CBC
HCT: 27.9 % — ABNORMAL LOW (ref 39.0–52.0)
Hemoglobin: 9.2 g/dL — ABNORMAL LOW (ref 13.0–17.0)
MCH: 29.6 pg (ref 26.0–34.0)
MCHC: 33 g/dL (ref 30.0–36.0)
MCV: 89.7 fL (ref 80.0–100.0)
Platelets: 66 10*3/uL — ABNORMAL LOW (ref 150–400)
RBC: 3.11 MIL/uL — ABNORMAL LOW (ref 4.22–5.81)
RDW: 14.2 % (ref 11.5–15.5)
WBC: 10.2 10*3/uL (ref 4.0–10.5)
nRBC: 0 % (ref 0.0–0.2)

## 2020-02-03 LAB — GLUCOSE, CAPILLARY
Glucose-Capillary: 133 mg/dL — ABNORMAL HIGH (ref 70–99)
Glucose-Capillary: 135 mg/dL — ABNORMAL HIGH (ref 70–99)
Glucose-Capillary: 143 mg/dL — ABNORMAL HIGH (ref 70–99)
Glucose-Capillary: 165 mg/dL — ABNORMAL HIGH (ref 70–99)
Glucose-Capillary: 185 mg/dL — ABNORMAL HIGH (ref 70–99)
Glucose-Capillary: 99 mg/dL (ref 70–99)

## 2020-02-03 LAB — MAGNESIUM: Magnesium: 2.2 mg/dL (ref 1.7–2.4)

## 2020-02-03 NOTE — Progress Notes (Signed)
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 Remained intubated after surgery. Improved hypotension.  Weaned off vasopressors Remains too somnolent for extubation.  Past Medical History   Past Medical History:  Diagnosis Date  . Diabetes mellitus without complication ([place])   . Generalized skin cysts    back x 2, left leg, hand  . Hepatitis    Hepatitis B  . Meningioma [facility])      Past Surgical History:  Procedure Laterality Date  . CRANIOTOMY Bilateral 01/31/2020   Procedure: OCCIPITAL CRANIOTOMY FOR MASS;  Surgeon: Ashok Pall, MD;  Location: [place];  Service: Neurosurgery;  Laterality: Bilateral;      Interim history/subjective:  Has successfully passed a 1/2-hour SBT this morning.  Far more awake.  Following commands.  Objective   Blood pressure 98/68, pulse 71, temperature 98.5 F (36.9 C), temperature source Axillary, resp. rate 13, height 5\' 5"  (1.651 m), weight 85.3 kg, SpO2 100 %.    Vent Mode: CPAP;PSV FiO2 (%):  [40 %] 40 % Set Rate:  [16 bmp] 16 bmp Vt Set:  [490 mL] 490 mL PEEP:  [5 cmH20] 5 cmH20 Pressure Support:  [5 cmH20] 5 cmH20 Plateau Pressure:  [11 cmH20-14 cmH20] 14 cmH20   Intake/Output Summary (Last 24 hours) at 02/03/2020 0914 Last data filed at 02/03/2020 0800 Gross per 24 hour  Intake 1536.27 ml  Output 3450 ml  Net -1913.73 ml   Filed Weights   01/31/20 0626  Weight: 85.3 kg    Examination: Physical Exam Constitutional:      Appearance: Normal appearance.     Interventions: He is intubated.  HENT:     Head:     Comments: Surgical incision in place with minimal  drainage via Hemovac.  No area of fluctuance. Cardiovascular:     Rate and Rhythm: Normal rate and regular rhythm.     Heart sounds: S1 normal and S2 normal.  Pulmonary:     Effort: Pulmonary effort is normal. He is intubated.  Abdominal:     General: Abdomen is flat.     Tenderness: There is no abdominal tenderness.  Genitourinary:    Comments: External catheter in place. Neurological:     General: No focal deficit present.     Mental Status: He is lethargic.     Comments: Follows commands briskly and all 4 limbs.  Psychiatric:        Behavior: Behavior is cooperative.      Ancillary tests (personally reviewed)  CBC: Recent Labs  Lab 01/31/20 1456 01/31/20 1456 01/31/20 1543 01/31/20 2003 02/01/20 0537 02/02/20 1156 02/03/20 0616  WBC 13.9*  --   --  10.7* 11.9* 10.1 10.2  HGB 10.8*   < > 10.2* 9.6* 9.3* 9.1* 9.2*  HCT 31.1*   < > 30.0* 27.5* 26.1* 27.2* 27.9*  MCV 86.9  --   --  84.6 85.6 88.9 89.7  PLT PLATELET CLUMPS NOTED ON SMEAR, UNABLE TO ESTIMATE  --   --  70* 71* 62* 66*   < > = values in this interval not displayed.    Basic Metabolic Panel: Recent Labs  Lab 01/31/20 1456 01/31/20 1456 01/31/20 1543 01/31/20 2003 02/01/20 0537 02/02/20 0803 02/02/20  1156 02/03/20 0616  NA 145   < > 147* 144 143  --  138 137  K 3.4*   < > 3.4* 3.8 3.6  --  3.4* 3.6  CL 110  --   --  109 109  --  106 104  CO2 24  --   --  25 25  --  24 26  GLUCOSE 166*  --   --  138* 135*  --  121* 151*  BUN 18  --   --  17 14  --  9 11  CREATININE 1.11  --   --  0.94 0.83  --  0.74 0.59*  CALCIUM 9.8  --   --  8.9 8.4*  --  8.2* 8.3*  MG 1.6*  --   --   --   --  1.5*  --  2.2  PHOS  --   --   --   --   --  2.8  --   --    < > = values in this interval not displayed.   GFR: Estimated Creatinine Clearance: 120.8 mL/min (A) (by C-G formula based on SCr of 0.59 mg/dL (L)). Recent Labs  Lab 01/31/20 2003 02/01/20 0537 02/02/20 1156 02/03/20 0616  WBC 10.7* 11.9* 10.1 10.2     Liver Function Tests: Recent Labs  Lab 01/31/20 0614 01/31/20 2003  AST 32 65*  ALT 45* 65*  ALKPHOS 55 35*  BILITOT 0.7 1.7*  PROT 7.9 4.7*  ALBUMIN 3.3* 2.7*   No results for input(s): LIPASE, AMYLASE in the last 168 hours. No results for input(s): AMMONIA in the last 168 hours.  ABG    Component Value Date/Time   PHART 7.392 02/02/2020 0825   PCO2ART 40.8 02/02/2020 0825   PO2ART 138 (H) 02/02/2020 0825   HCO3 24.1 02/02/2020 0825   TCO2 28 01/31/2020 1543   ACIDBASEDEF 0.1 02/02/2020 0825   O2SAT 98.8 02/02/2020 0825     Coagulation Profile: Recent Labs  Lab 01/31/20 1456  INR 1.3*    Cardiac Enzymes: No results for input(s): CKTOTAL, CKMB, CKMBINDEX, TROPONINI in the last 168 hours.  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  Monitor respiratory status post extubation. Blood pressure target: Allow autoregulation DVT prophylaxis: Mechanical prophylaxis for now Nutritional status and feeding goals: Tube feeds -swallow  evaluation following extubation. GI prophylaxis: Protonix Fluid status goals: Allow autoregulation Urinary catheter: External catheter Central lines: Remove arterial line Glucose control: Euglycemic on no therapy Mobility/therapy needs: Bedrest Antibiotic de-escalation:  Perioperative antibiotics completed Home medication reconciliation: On hold Daily labs: CBC BMP Code Status: Full Family Communication: We will update Disposition: ICU  Critical care time: 35 minutes    Kipp Brood, MD [facility] ICU Physician [place]  Pager: [phone] Mobile: [phone] After hours: [phone].  02/03/2020, 9:14 AM

## 2020-02-03 NOTE — Procedures (Signed)
Extubation Procedure Note  Patient Details:   Name: Harold Torres DOB: [birth year] MRN: [id number]   Airway Documentation:    Vent end date: 02/03/20 Vent end time: 0950   Evaluation  O2 sats: stable throughout Complications: No apparent complications Patient did tolerate procedure well. Bilateral Breath Sounds: Diminished   Yes   RT extubated patient to 4L [place] per MD order with RN at bedside. Positive cuff leak noted. Patient tolerated well. No stridor noted at this time. RT will continue to monitor as needed.  Fabiola Backer 02/03/2020, 11:30 AM

## 2020-02-03 NOTE — Progress Notes (Signed)
Patient ID: Harold Torres, male   DOB: Feb 01, 1976, 44 y.o.   MRN: [id number] BP 112/81   Pulse 73   Temp 98.5 F (36.9 C) (Axillary)   Resp 14   Ht 5\' 5"  (1.651 m)   Wt 85.3 kg   SpO2 100%   BMI 31.28 kg/m  Alert, following commands Moving all extremities well, wound is clean, and dry. No signs of infection Platelet count is low, to be expected. No excessive bleeding Hematocrit is stable Perrl, full eom Voice is raspy, breathing comfortably on his own Will discuss at tumor board. Complex diagnosis.very high rate of recurrence, and aggressive.

## 2020-02-04 LAB — TYPE AND SCREEN
ABO/RH(D): B POS
Antibody Screen: NEGATIVE
Unit division: 0
Unit division: 0
Unit division: 0
Unit division: 0
Unit division: 0
Unit division: 0
Unit division: 0
Unit division: 0
Unit division: 0
Unit division: 0
Unit division: 0
Unit division: 0
Unit division: 0
Unit division: 0
Unit division: 0
Unit division: 0
Unit division: 0
Unit division: 0
Unit division: 0
Unit division: 0
Unit division: 0
Unit division: 0
Unit division: 0
Unit division: 0
Unit division: 0
Unit division: 0
Unit division: 0
Unit division: 0

## 2020-02-04 LAB — BPAM RBC
Blood Product Expiration Date: 202108032359
Blood Product Expiration Date: 202108152359
Blood Product Expiration Date: 202108202359
Blood Product Expiration Date: 202108202359
Blood Product Expiration Date: 202108222359
Blood Product Expiration Date: 202108222359
Blood Product Expiration Date: 202108232359
Blood Product Expiration Date: 202108232359
Blood Product Expiration Date: 202108232359
Blood Product Expiration Date: 202108232359
Blood Product Expiration Date: 202108242359
Blood Product Expiration Date: 202108272359
Blood Product Expiration Date: 202108282359
Blood Product Expiration Date: 202108282359
Blood Product Expiration Date: 202108282359
Blood Product Expiration Date: 202108292359
Blood Product Expiration Date: 202108292359
Blood Product Expiration Date: 202108292359
Blood Product Expiration Date: 202108292359
Blood Product Expiration Date: 202108292359
Blood Product Expiration Date: 202108292359
Blood Product Expiration Date: 202108292359
Blood Product Expiration Date: 202108292359
Blood Product Expiration Date: 202108292359
Blood Product Expiration Date: 202108292359
Blood Product Expiration Date: 202108292359
Blood Product Expiration Date: 202108292359
Blood Product Expiration Date: 202108292359
ISSUE DATE / TIME: 202107290830
ISSUE DATE / TIME: 202107290830
ISSUE DATE / TIME: 202107290830
ISSUE DATE / TIME: 202107290830
ISSUE DATE / TIME: 202107290902
ISSUE DATE / TIME: 202107290902
ISSUE DATE / TIME: 202107290902
ISSUE DATE / TIME: 202107290902
ISSUE DATE / TIME: 202107291018
ISSUE DATE / TIME: 202107291018
ISSUE DATE / TIME: 202107291018
ISSUE DATE / TIME: 202107291018
ISSUE DATE / TIME: 202107291055
ISSUE DATE / TIME: 202107291055
ISSUE DATE / TIME: 202107291055
ISSUE DATE / TIME: 202107291055
ISSUE DATE / TIME: 202107291126
ISSUE DATE / TIME: 202107291145
ISSUE DATE / TIME: 202107291145
ISSUE DATE / TIME: 202107291145
ISSUE DATE / TIME: 202107291145
ISSUE DATE / TIME: 202107292149
ISSUE DATE / TIME: 202107292149
ISSUE DATE / TIME: 202107302347
ISSUE DATE / TIME: 202108011545
ISSUE DATE / TIME: 202108011545
ISSUE DATE / TIME: 202108012150
ISSUE DATE / TIME: 202108012354
Unit Type and Rh: 1700
Unit Type and Rh: 5100
Unit Type and Rh: 5100
Unit Type and Rh: 5100
Unit Type and Rh: 5100
Unit Type and Rh: 5100
Unit Type and Rh: 5100
Unit Type and Rh: 5100
Unit Type and Rh: 5100
Unit Type and Rh: 5100
Unit Type and Rh: 5100
Unit Type and Rh: 5100
Unit Type and Rh: 5100
Unit Type and Rh: 5100
Unit Type and Rh: 5100
Unit Type and Rh: 5100
Unit Type and Rh: 5100
Unit Type and Rh: 7300
Unit Type and Rh: 7300
Unit Type and Rh: 7300
Unit Type and Rh: 7300
Unit Type and Rh: 7300
Unit Type and Rh: 7300
Unit Type and Rh: 7300
Unit Type and Rh: 7300
Unit Type and Rh: 7300
Unit Type and Rh: 7300
Unit Type and Rh: 7300

## 2020-02-04 LAB — GLUCOSE, CAPILLARY
Glucose-Capillary: 119 mg/dL — ABNORMAL HIGH (ref 70–99)
Glucose-Capillary: 146 mg/dL — ABNORMAL HIGH (ref 70–99)
Glucose-Capillary: 147 mg/dL — ABNORMAL HIGH (ref 70–99)
Glucose-Capillary: 168 mg/dL — ABNORMAL HIGH (ref 70–99)
Glucose-Capillary: 187 mg/dL — ABNORMAL HIGH (ref 70–99)
Glucose-Capillary: 97 mg/dL (ref 70–99)

## 2020-02-04 MED ORDER — ENSURE ENLIVE PO LIQD
237.0000 mL | Freq: Two times a day (BID) | ORAL | Status: DC
  Administered 2020-02-05 – 2020-02-07 (×3): 237 mL via ORAL

## 2020-02-04 MED ORDER — CHLORHEXIDINE GLUCONATE CLOTH 2 % EX PADS
6.0000 | MEDICATED_PAD | Freq: Every day | CUTANEOUS | Status: DC
  Administered 2020-02-04 – 2020-02-08 (×5): 6 via TOPICAL

## 2020-02-04 NOTE — Progress Notes (Signed)
Patient ID: Harold Torres, male   DOB: 1975-12-11, 44 y.o.   MRN: [id number] BP (!) 87/63   Pulse 63   Temp (!) 100.7 F (38.2 C) (Oral)   Resp 15   Ht 5\' 5"  (1.651 m)   Wt 85.3 kg   SpO2 100%   BMI 31.28 kg/m  Alert and oriented x 4 Following all commands Will consult neuro oncology, given rarity of tumor and overall aggressiveness Will order head ct.

## 2020-02-04 NOTE — Progress Notes (Signed)
Nutrition Follow-up   DOCUMENTATION CODES:   Not applicable  INTERVENTION:   Encourage PO intake  Ensure Enlive po BID, each supplement provides 350 kcal and 20 grams of protein   NUTRITION DIAGNOSIS:   Inadequate oral intake related to inability to eat as evidenced by NPO status. Ongoing.   GOAL:   Provide needs based on ASPEN/SCCM guidelines  MONITOR:   Vent status, I & O's, Labs, Weight trends, TF tolerance, Skin  REASON FOR ASSESSMENT:   Ventilator, Consult Enteral/tube feeding initiation and management  ASSESSMENT:   44 year old male who presented on 729 for tumor resection. PMH of T2DM and hepatitis B.   Pt discussed during ICU rounds and with RN.  Per RN family bringing in food from home for patient as he does not like food here.  Depending on clinical course pt may benefit from DM diet education as outpatient.   7/29 - s/p craniotomy and resection of hemangiopericytoma; Course complicated by large amount of bleeding requiring massive transfusion. Per Neurosurgeon pt will need neuro oncology consult.   Medications and labs reviewed Lab Results  Component Value Date   HGBA1C 7.1 (H) 01/31/2020      Diet Order:   Diet Order            Diet Carb Modified Fluid consistency: Thin; Room service appropriate? Yes with Assist  Diet effective now                 EDUCATION NEEDS:   No education needs have been identified at this time  Skin:  Skin Assessment: Skin Integrity Issues: Incisions: closed incision to head  Last BM:  no documented BM  Height:   Ht Readings from Last 1 Encounters:  01/31/20 5\' 5"  (1.651 m)    Weight:   Wt Readings from Last 1 Encounters:  01/31/20 85.3 kg    Ideal Body Weight:  61.8 kg  BMI:  Body mass index is 31.28 kg/m.  Estimated Nutritional Needs:   Kcal:  2300-2500  Protein:  100-115 grams  Fluid:  >/= 2.0 L  Harold Minton P., RD, LDN, CNSC See AMiON for contact information

## 2020-02-04 NOTE — Evaluation (Signed)
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 ETT 7/29-8/1. PMH includes DM, hep B.  Clinical Impression   Pt presents with difficulty performing mobility tasks, hypotension with symptomatic dizziness with mobility, nausea and vomiting with mobility, and decreased activity tolerance. Pt to benefit from acute PT to address deficits. Pt sat EOB for limited time only this day limited by nausea and vomiting at EOB and dizziness suspect related to hypotension. Pt was independent PTA, works as a Scientist, clinical (histocompatibility and immunogenetics), lives with wife who is pregnant. PT expects pt to progress well with mobility, recommending HHPT at this time. PT to progress mobility as tolerated, and will continue to follow acutely.   - BP EOB 0 minutes 106/76 (85) - BP EOB 2 minutes 94/68(76) - BP return to supine 118/83(95) SpO2 91% on RA, reapplied 4Lo2 at end of session for return to 100%.   Follow Up Recommendations Home health PT;Supervision for mobility/OOB    Equipment Recommendations  None recommended by PT (TBD)    Recommendations for Other Services       Precautions / Restrictions Precautions Precautions: Fall;Other (comment) (moderate) Precaution Comments: crani Restrictions Weight Bearing Restrictions: No      Mobility  Bed Mobility Overal bed mobility: Needs Assistance Bed Mobility: Supine to Sit;Sit to Supine     Supine to sit: Min assist;HOB elevated Sit to supine: Min guard;Mod assist;+2 for safety/equipment   General bed mobility comments: Min assist supine>sit for trunk elevation with HHA, min guard for return to supine with mod +2 to scoot up in bed with use of boost function. Pt assisting with scooting up in bed with pushing through LEs.  Transfers                  General transfer comment: unable - nausea, vomiting, hypotension  Ambulation/Gait                Stairs            Wheelchair Mobility    Modified Rankin (Stroke Patients Only)       Balance Overall balance assessment: Needs assistance Sitting-balance support: No upper extremity supported;Feet unsupported Sitting balance-Leahy Scale: Good Sitting balance - Comments: able to don socks EOB without support, tolerance for EOB x3 minutes with N/V                                     Pertinent Vitals/Pain Pain Assessment: No/denies pain    Home Living Family/patient expects to be discharged to:: Private residence Living Arrangements: Spouse/significant other;Children Available Help at Discharge: Family (wife is pregnant, difficult to discern if wife is home 24/7) Type of Home: House Home Access: Level entry     Home Layout: Two level;Able to live on main level with bedroom/bathroom Home Equipment: None      Prior Function Level of Independence: Independent         Comments: works as a Transport planner   Dominant Hand: Right    Extremity/Trunk Assessment   Upper Extremity Assessment Upper Extremity Assessment: Defer to OT evaluation    Lower Extremity Assessment Lower Extremity Assessment: Overall WFL for tasks assessed    Cervical / Trunk Assessment Cervical / Trunk  Assessment: Normal  Communication   Communication: Prefers language other than [place];Interpreter utilized Malachy Moan [phone])  Cognition Arousal/Alertness: Awake/alert Behavior During Therapy: WFL for tasks assessed/performed Overall Cognitive Status: Within Functional Limits for tasks assessed                                 General Comments: Answers questions appropriately when answers, difficult for pt to vocalize s/p intubation.      General Comments General comments (skin integrity, edema, etc.): Pt reported  dizziness sitting EOB, initial BP EOB 0 minutes 106/76 (85), BP EOB 2 minutes 94/68(76), BP return to supine 118/83(95). SpO2 91% on RA, reapplied 4Lo2 at end of session for return to 100%.    Exercises     Assessment/Plan    PT Assessment Patient needs continued PT services  PT Problem List Decreased mobility;Decreased safety awareness;Decreased activity tolerance;Decreased balance;Decreased knowledge of use of DME;Pain;Cardiopulmonary status limiting activity       PT Treatment Interventions DME instruction;Therapeutic activities;Gait training;Patient/family education;Therapeutic exercise;Balance training;Stair training;Functional mobility training;Neuromuscular re-education    PT Goals (Current goals can be found in the Care Plan section)  Acute Rehab PT Goals Patient Stated Goal: none explicitly stated PT Goal Formulation: With patient Time For Goal Achievement: 02/18/20 Potential to Achieve Goals: Good    Frequency Min 3X/week   Barriers to discharge        Co-evaluation PT/OT/SLP Co-Evaluation/Treatment: Yes Reason for Co-Treatment: For patient/therapist safety;To address functional/ADL transfers;Complexity of the patient's impairments (multi-system involvement) PT goals addressed during session: Mobility/safety with mobility;Balance         AM-PAC PT "6 Clicks" Mobility  Outcome Measure Help needed turning from your back to your side while in a flat bed without using bedrails?: A Little Help needed moving from lying on your back to sitting on the side of a flat bed without using bedrails?: A Little Help needed moving to and from a bed to a chair (including a wheelchair)?: A Lot Help needed standing up from a chair using your arms (e.g., wheelchair or bedside chair)?: A Little Help needed to walk in hospital room?: A Lot Help needed climbing 3-5 steps with a railing? : A Lot 6 Click Score: 15    End of Session Equipment Utilized During Treatment: Oxygen Activity  Tolerance: Patient limited by fatigue;Treatment limited secondary to medical complications (Comment) (n/v) Patient left: in bed;with call bell/phone within reach;with bed alarm set;with SCD's reapplied Nurse Communication: Mobility status;Other (comment) (N/v - RN administered nausea medication at end of session) PT Visit Diagnosis: Other abnormalities of gait and mobility (R26.89);Muscle weakness (generalized) (M62.81)    Time: 8299-3716 PT Time Calculation (min) (ACUTE ONLY): 29 min   Charges:   PT Evaluation $PT Eval Low Complexity: 1 Low          Larra Crunkleton E, PT Acute Rehabilitation Services Pager [phone]  Office [phone]   Bucky Grigg D Janashia Parco 02/04/2020, 11:19 AM

## 2020-02-04 NOTE — Addendum Note (Signed)
Addendum  created 02/04/20 1033 by Josephine Igo, CRNA   Order list changed

## 2020-02-04 NOTE — Progress Notes (Signed)
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 Remained intubated after surgery. Improved hypotension.  Weaned off vasopressors Remains too somnolent for extubation.  Past Medical History   Past Medical History:  Diagnosis Date  . Diabetes mellitus without complication ([place])   . Generalized skin cysts    back x 2, left leg, hand  . Hepatitis    Hepatitis B  . Meningioma [facility])      Past Surgical History:  Procedure Laterality Date  . CRANIOTOMY Bilateral 01/31/2020   Procedure: OCCIPITAL CRANIOTOMY FOR MASS;  Surgeon: Ashok Pall, MD;  Location: [place];  Service: Neurosurgery;  Laterality: Bilateral;      Interim history/subjective:  Has successfully passed a 1/2-hour SBT this morning.  Far more awake.  Following commands.  Objective   Blood pressure (!) 87/63, pulse 63, temperature (!) 100.7 F (38.2 C), temperature source Oral, resp. rate 15, height 5\' 5"  (1.651 m), weight 85.3 kg, SpO2 100 %.        Intake/Output Summary (Last 24 hours) at 02/04/2020 0849 Last data filed at 02/04/2020 0600 Gross per 24 hour  Intake 100 ml  Output 3930 ml  Net -3830 ml   Filed Weights   01/31/20 0626  Weight: 85.3 kg    Examination: Physical Exam Constitutional:      Appearance: Normal appearance.  HENT:     Head:     Comments: Hemovac remove. Minimal bleeding at the site.  Cardiovascular:     Rate and Rhythm: Normal rate and regular rhythm.     Heart sounds: S1 normal and S2 normal.  Pulmonary:     Effort: Pulmonary effort is normal.  Abdominal:     General: Abdomen is flat.     Tenderness: There is no  abdominal tenderness.  Genitourinary:    Comments: External catheter in place. Neurological:     General: No focal deficit present.     Mental Status: He is alert.     Comments: Follows commands briskly and all 4 limbs.  Psychiatric:        Behavior: Behavior is cooperative.      Ancillary tests (personally reviewed)  CBC: Recent Labs  Lab 01/31/20 1456 01/31/20 1456 01/31/20 1543 01/31/20 2003 02/01/20 0537 02/02/20 1156 02/03/20 0616  WBC 13.9*  --   --  10.7* 11.9* 10.1 10.2  HGB 10.8*   < > 10.2* 9.6* 9.3* 9.1* 9.2*  HCT 31.1*   < > 30.0* 27.5* 26.1* 27.2* 27.9*  MCV 86.9  --   --  84.6 85.6 88.9 89.7  PLT PLATELET CLUMPS NOTED ON SMEAR, UNABLE TO ESTIMATE  --   --  70* 71* 62* 66*   < > = values in this interval not displayed.    Basic Metabolic Panel: Recent Labs  Lab 01/31/20 1456 01/31/20 1456 01/31/20 1543 01/31/20 2003 02/01/20 0537 02/02/20 0803 02/02/20 1156 02/03/20 0616  NA 145   < > 147* 144 143  --  138 137  K 3.4*   < > 3.4* 3.8 3.6  --  3.4* 3.6  CL 110  --   --  109 109  --  106 104  CO2 24  --   --  25 25  --  24 26  GLUCOSE 166*  --   --  138* 135*  --  121* 151*  BUN 18  --   --  17 14  --  9 11  CREATININE 1.11  --   --  0.94 0.83  --  0.74 0.59*  CALCIUM 9.8  --   --  8.9 8.4*  --  8.2* 8.3*  MG 1.6*  --   --   --   --  1.5*  --  2.2  PHOS  --   --   --   --   --  2.8  --   --    < > = values in this interval not displayed.   GFR: Estimated Creatinine Clearance: 120.8 mL/min (A) (by C-G formula based on SCr of 0.59 mg/dL (L)). Recent Labs  Lab 01/31/20 2003 02/01/20 0537 02/02/20 1156 02/03/20 0616  WBC 10.7* 11.9* 10.1 10.2    Liver Function Tests: Recent Labs  Lab 01/31/20 0614 01/31/20 2003  AST 32 65*  ALT 45* 65*  ALKPHOS 55 35*  BILITOT 0.7 1.7*  PROT 7.9 4.7*  ALBUMIN 3.3* 2.7*   No results for input(s): LIPASE, AMYLASE in the last 168 hours. No results for input(s): AMMONIA in the last 168 hours.  ABG      Component Value Date/Time   PHART 7.392 02/02/2020 0825   PCO2ART 40.8 02/02/2020 0825   PO2ART 138 (H) 02/02/2020 0825   HCO3 24.1 02/02/2020 0825   TCO2 28 01/31/2020 1543   ACIDBASEDEF 0.1 02/02/2020 0825   O2SAT 98.8 02/02/2020 0825     Coagulation Profile: Recent Labs  Lab 01/31/20 1456  INR 1.3*    Cardiac Enzymes: No results for input(s): CKTOTAL, CKMB, CKMBINDEX, TROPONINI in the last 168 hours.  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   Daily Goals Checklist  Pain/Anxiety/Delirium protocol (if indicated): Off all sedatives VAP protocol (if indicated): Bundle in place Respiratory support goals: SBT and extubate today.  Monitor respiratory status post extubation. Blood pressure target: Allow autoregulation DVT prophylaxis: Mechanical prophylaxis for now Nutritional status and feeding goals: full diet GI prophylaxis: Protonix Fluid status goals: Allow autoregulation Urinary catheter: External catheter Central lines: Remove arterial line Glucose control: Euglycemic on no therapy Mobility/therapy needs: Bedrest Antibiotic de-escalation: Perioperative antibiotics completed Home medication reconciliation:  On hold Daily labs: CBC BMP Code Status: Full Family Communication: We will update Disposition: ICU  Kipp Brood, MD [facility] ICU Physician [place]  Pager: [phone] Mobile: [phone] After hours: [phone].  02/04/2020, 8:49 AM

## 2020-02-04 NOTE — Evaluation (Signed)
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 ETT 7/29-8/1. PMH includes DM, hep B.   Clinical Impression   This 44 yo male admitted and underwent above presents to acute OT with PLOF of totally independent and all basic and IADLs and working full time as a Scientist, clinical (histocompatibility and immunogenetics). Currently he is limited in mobility due to N/V. He will benefit from acute OT without need for follow up.     Follow Up Recommendations  No OT follow up;Supervision - Intermittent    Equipment Recommendations  None recommended by OT       Precautions / Restrictions Precautions Precautions: Fall Precaution Comments: crani      Mobility Bed Mobility Overal bed mobility: Needs Assistance Bed Mobility: Supine to Sit;Sit to Supine     Supine to sit: Min assist;HOB elevated Sit to supine: Min guard;Mod assist;+2 for safety/equipment   General bed mobility comments: Min assist supine>sit for trunk elevation with HHA, min guard for return to supine with mod +2 to scoot up in bed with use of boost function. Pt assisting with scooting up in bed with pushing through LEs.  Transfers                 General transfer comment: unable - nausea, vomiting, hypotension    Balance Overall balance assessment: Needs assistance Sitting-balance support: No upper extremity supported;Feet unsupported Sitting balance-Leahy Scale: Good Sitting balance - Comments: able to don socks EOB without support, tolerance for EOB x3 minutes with N/V                                   ADL either performed or assessed with clinical judgement   ADL Overall ADL's : Needs assistance/impaired Eating/Feeding: Independent;Sitting   Grooming: Set up;Sitting   Upper Body Bathing:  Set up;Sitting   Lower Body Bathing: Minimal assistance   Upper Body Dressing : Set up;Sitting   Lower Body Dressing: Minimal assistance                       Vision Baseline Vision/History: No visual deficits              Pertinent Vitals/Pain Pain Assessment: No/denies pain     Hand Dominance Right   Extremity/Trunk Assessment Upper Extremity Assessment Upper Extremity Assessment: Overall WFL for tasks assessed           Communication Communication Communication: Prefers language other than [place];Interpreter utilized Malachy Moan [phone])   Cognition Arousal/Alertness: Awake/alert Behavior During Therapy: WFL for tasks assessed/performed Overall Cognitive Status: Within Functional Limits for tasks assessed                                 General Comments: Answers questions appropriately when answers, difficult for pt to vocalize s/p intubation.              Home Living Family/patient expects to be discharged to:: Private residence Living Arrangements: Spouse/significant other;Children Available Help at Discharge: Family (wife is  pregnant-diffcult to asses if she is home 24/7) Type of Home: House Home Access: Level entry     Home Layout: Two level;Able to live on main level with bedroom/bathroom  Alternate Level Stairs-Number of Steps: flight Alternate Level Stairs-Rails: Left Bathroom Shower/Tub: Teacher, early years/pre: Handicapped height     Home Equipment: None          Prior Functioning/Environment Level of Independence: Independent        Comments: works as a Publishing copy Problem List: Impaired balance (sitting and/or standing)      OT Treatment/Interventions: Self-care/ADL training;DME and/or AE instruction;Patient/family education;Balance training    OT Goals(Current goals can be found in the care plan section) Acute Rehab OT Goals Patient Stated Goal: none stated OT Goal Formulation:  With patient Time For Goal Achievement: 02/18/20 Potential to Achieve Goals: Good  OT Frequency: Min 2X/week           Co-evaluation PT/OT/SLP Co-Evaluation/Treatment: Yes Reason for Co-Treatment: For patient/therapist safety;To address functional/ADL transfers PT goals addressed during session: Mobility/safety with mobility;Balance OT goals addressed during session: ADL's and self-care;Strengthening/ROM      AM-PAC OT "6 Clicks" Daily Activity     Outcome Measure Help from another person eating meals?: None Help from another person taking care of personal grooming?: A Little Help from another person toileting, which includes using toliet, bedpan, or urinal?: A Little Help from another person bathing (including washing, rinsing, drying)?: A Little Help from another person to put on and taking off regular upper body clothing?: A Little Help from another person to put on and taking off regular lower body clothing?: A Little 6 Click Score: 19   End of Session Nurse Communication:  (pt with emesis)  Activity Tolerance:  (pt limited by N/V) Patient left: in bed;with call bell/phone within reach;with bed alarm set  OT Visit Diagnosis: Unsteadiness on feet (R26.81);Other abnormalities of gait and mobility (R26.89);Muscle weakness (generalized) (M62.81)                Time: 3016-0109 OT Time Calculation (min): 29 min Charges:  OT General Charges $OT Visit: 1 Visit OT Evaluation $OT Eval Moderate Complexity: [place], OTR/L Acute [employer] Pager [phone] Office [phone]     Almon Register 02/04/2020, 3:30 PM

## 2020-02-05 ENCOUNTER — Inpatient Hospital Stay (HOSPITAL_COMMUNITY): Payer: 59

## 2020-02-05 LAB — GLUCOSE, CAPILLARY
Glucose-Capillary: 108 mg/dL — ABNORMAL HIGH (ref 70–99)
Glucose-Capillary: 114 mg/dL — ABNORMAL HIGH (ref 70–99)
Glucose-Capillary: 108 mg/dL — ABNORMAL HIGH (ref 70–99)
Glucose-Capillary: 114 mg/dL — ABNORMAL HIGH (ref 70–99)
Glucose-Capillary: 168 mg/dL — ABNORMAL HIGH (ref 70–99)
Glucose-Capillary: 224 mg/dL — ABNORMAL HIGH (ref 70–99)
Glucose-Capillary: 158 mg/dL — ABNORMAL HIGH (ref 70–99)
Glucose-Capillary: 111 mg/dL — ABNORMAL HIGH (ref 70–99)

## 2020-02-05 MED ORDER — PROCHLORPERAZINE 25 MG RE SUPP
25.0000 mg | Freq: Two times a day (BID) | RECTAL | Status: DC | PRN
  Administered 2020-02-05: 25 mg via RECTAL
  Filled 2020-02-05 (×2): qty 1

## 2020-02-05 NOTE — Progress Notes (Signed)
Pt transported to 4NP14, transferred from wheelchair to bed and then vomited. Pt felt better post emesis, 4NP RN at bedside.

## 2020-02-05 NOTE — Progress Notes (Signed)
Patient ID: Harold Torres, male   DOB: 05-13-1976, 44 y.o.   MRN: [id number] BP 102/70 (BP Location: Right Arm)   Pulse 76   Temp 99 F (37.2 C) (Oral)   Resp 20   Ht 5\' 5"  (1.651 m)   Wt 85.3 kg   SpO2 91%   BMI 31.28 kg/m  Alert and oriented x 4, speech is clear, fluent Moving aLl extremities well Wound is clean, dry, no signs of infection Will do head ct Will consult oncology

## 2020-02-05 NOTE — Progress Notes (Signed)
Pt transported to 4NP via wheelchair. Shortly after transport pt vomited. Pt has no complaints of stomach pain. Pt stated that he felt light headed prior to emesis. Post emesis pt has no complaints of discomfort or light headedness. On call provider notified. Compazine 25 mg suppository q12h PRN ordered.  Pt VS stable.Pt oriented to unit. Call bell in reach. Will continue to monitor.

## 2020-02-06 ENCOUNTER — Telehealth: Payer: Self-pay | Admitting: Radiation Oncology

## 2020-02-06 LAB — GLUCOSE, CAPILLARY
Glucose-Capillary: 103 mg/dL — ABNORMAL HIGH (ref 70–99)
Glucose-Capillary: 107 mg/dL — ABNORMAL HIGH (ref 70–99)
Glucose-Capillary: 121 mg/dL — ABNORMAL HIGH (ref 70–99)
Glucose-Capillary: 125 mg/dL — ABNORMAL HIGH (ref 70–99)
Glucose-Capillary: 135 mg/dL — ABNORMAL HIGH (ref 70–99)
Glucose-Capillary: 174 mg/dL — ABNORMAL HIGH (ref 70–99)
Glucose-Capillary: 188 mg/dL — ABNORMAL HIGH (ref 70–99)

## 2020-02-06 NOTE — Progress Notes (Signed)
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 ETT 7/29-8/1. PMH includes DM, hep B.   OT comments  Pt seen for OT Follow up, has progressed quite well and appears back to baseline. Pt has no c/o n/v this date. Was able to complete all mobility at mod I level. Pt demonstrating mod I level of toilet transfers/toileting as well. Re assess pt vision, no deficits noted. He does report some decreased ability to focus on screens. Educated pt on gaze stabilization and limiting screen time while recovering from surgery. All OT needs have been met at this time, OT will sign off. Thank you for this consult.    Follow Up Recommendations  No OT follow up;Supervision - Intermittent    Equipment Recommendations  None recommended by OT    Recommendations for Other Services      Precautions / Restrictions Precautions Precautions: Fall Precaution Comments: crani Restrictions Weight Bearing Restrictions: No       Mobility Bed Mobility Overal bed mobility: Modified Independent                Transfers Overall transfer level: Modified independent                    Balance Overall balance assessment: Mild deficits observed, not formally tested                                         ADL either performed or assessed with clinical judgement   ADL Overall ADL's : Needs assistance/impaired                     Lower Body Dressing: Supervision/safety;Sitting/lateral leans;Sit to/from stand   Toilet Transfer: Modified Independent;Ambulation;Regular Glass blower/designer Details (indicate cue type and reason): pt has been copleting toilet transfers in room independently and presented the same while OT was present. No  LOB or safety concern noted. Toileting- Clothing Manipulation and Hygiene: Modified independent;Sitting/lateral lean   Tub/ Shower Transfer: Supervision/safety;Ambulation   Functional mobility during ADLs: Supervision/safety General ADL Comments: pt without n/v this date, able to progress and complete BADL very close to baseline     Vision Baseline Vision/History: No visual deficits Patient Visual Report: Eye fatigue/eye pain/headache Vision Assessment?: No apparent visual deficits Additional Comments: assessed vision- no field cuts noted. Pt does have some disorganized scanning patterns and difficulty focusing when looking at a phone screen. Educated on gaze stabilization and resting from screens during recovery process   Perception     Praxis      Cognition Arousal/Alertness: Awake/alert Behavior During Therapy: WFL for tasks assessed/performed Overall Cognitive Status: Within Functional Limits for tasks assessed                                 General Comments: interpreter utilized, pt appropriate with all commands        Exercises     Shoulder Instructions       General Comments      Pertinent Vitals/ Pain       Pain Assessment: No/denies pain  Home Living  Prior Functioning/Environment              Frequency  Min 2X/week        Progress Toward Goals  OT Goals(current goals can now be found in the care plan section)  Progress towards OT goals: Goals met/education completed, patient discharged from OT  Acute Rehab OT Goals Patient Stated Goal: go home, no n/v today OT Goal Formulation: All assessment and education complete, DC therapy  Plan All goals met and education completed, patient discharged from OT services    Co-evaluation                 AM-PAC OT "6 Clicks" Daily Activity     Outcome Measure   Help from another person eating meals?: None Help from  another person taking care of personal grooming?: None Help from another person toileting, which includes using toliet, bedpan, or urinal?: None Help from another person bathing (including washing, rinsing, drying)?: None Help from another person to put on and taking off regular upper body clothing?: None Help from another person to put on and taking off regular lower body clothing?: None 6 Click Score: 24    End of Session Equipment Utilized During Treatment: Gait belt  OT Visit Diagnosis: Unsteadiness on feet (R26.81);Other abnormalities of gait and mobility (R26.89);Muscle weakness (generalized) (M62.81)   Activity Tolerance Patient tolerated treatment well   Patient Left in bed;with call bell/phone within reach   Nurse Communication Mobility status        Time: 9211-9417 OT Time Calculation (min): 12 min  Charges: OT General Charges $OT Visit: 1 Visit OT Treatments $Self Care/Home Management : 8-22 mins  Zenovia Jarred, MSOT, OTR/L [facility] Office Number: [phone] Pager: [phone]  Zenovia Jarred 02/06/2020, 11:15 AM

## 2020-02-06 NOTE — Progress Notes (Signed)
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 ETT 7/29-8/1. PMH includes DM, hep B.    PT Comments    Pt with much improved mobility this day vs eval, ambulating great hallway distance with intermittent reaching for environment to steady. Pt complained of mild dizziness with head turns, but pt reports as tolerable and takes his time during challenges to mobility. PT does not anticipate follow up PT needs at this time, will continue to follow acutely .    Follow Up Recommendations  Supervision for mobility/OOB;No PT follow up     Equipment Recommendations  None recommended by PT (TBD)    Recommendations for Other Services       Precautions / Restrictions Precautions Precautions: Fall Precaution Comments: crani Restrictions Weight Bearing Restrictions: No    Mobility  Bed Mobility Overal bed mobility: Modified Independent       Supine to sit: Modified independent (Device/Increase time)        Transfers Overall transfer level: Modified independent               General transfer comment: increased time to rise and steady self, pt reaching for environment  Ambulation/Gait Ambulation/Gait assistance: Supervision;Min guard Gait Distance (Feet): 400 Feet Assistive device: None Gait Pattern/deviations: Step-through pattern Gait velocity: decr - pt cautious   General Gait Details: Supervision for safety, initially min guard as pt with periods of unsteadiness and reaching for environment upon first few steps. Pt reporting dizziness with challenge (see balance section) and mild sensitivity to light in hallway   Stairs             Wheelchair Mobility    Modified Rankin (Stroke Patients Only)       Balance Overall  balance assessment: Needs assistance Sitting-balance support: No upper extremity supported;Feet unsupported Sitting balance-Leahy Scale: Good       Standing balance-Leahy Scale: Good               High level balance activites: Head turns;Direction changes High Level Balance Comments: WFL balance with head turns and directional changes, but increased dizziness during.            Cognition Arousal/Alertness: Awake/alert Behavior During Therapy: WFL for tasks assessed/performed Overall Cognitive Status: Within Functional Limits for tasks assessed                                 General Comments: interpreter utilized [employer] [phone]), pt appropriate with all commands      Exercises      General Comments        Pertinent Vitals/Pain Pain Assessment: No/denies pain    Home Living                      Prior Function            PT Goals (current goals can now be found in the care plan section) Acute Rehab PT Goals Patient Stated Goal: go home, no n/v today PT Goal Formulation: With patient Time For Goal Achievement: 02/18/20 Potential to Achieve Goals: Good Progress towards PT goals: Progressing toward goals    Frequency    Min 3X/week      PT Plan Discharge plan needs to be  updated    Co-evaluation              AM-PAC PT "6 Clicks" Mobility   Outcome Measure  Help needed turning from your back to your side while in a flat bed without using bedrails?: None Help needed moving from lying on your back to sitting on the side of a flat bed without using bedrails?: None Help needed moving to and from a bed to a chair (including a wheelchair)?: A Little Help needed standing up from a chair using your arms (e.g., wheelchair or bedside chair)?: A Little Help needed to walk in hospital room?: A Little Help needed climbing 3-5 steps with a railing? : A Little 6 Click Score: 20    End of Session Equipment Utilized During  Treatment: Gait belt Activity Tolerance: Patient tolerated treatment well (n/v) Patient left: in bed;with call bell/phone within reach;with SCD's reapplied Nurse Communication: Mobility status PT Visit Diagnosis: Other abnormalities of gait and mobility (R26.89);Muscle weakness (generalized) (M62.81)     Time: 1751-0258 PT Time Calculation (min) (ACUTE ONLY): 12 min  Charges:  $Gait Training: 8-22 mins                     Chelli Yerkes E, PT Acute Rehabilitation Services Pager [phone]  Office [place] 02/06/2020, 5:03 PM

## 2020-02-06 NOTE — Telephone Encounter (Signed)
Rec'd a call from Dr. Christella Noa about wanting an inpatient consult from [facility]. I contacted Freeman Caldron, PA and gave her information and Dr. Lacy Duverney number.

## 2020-02-07 ENCOUNTER — Other Ambulatory Visit: Payer: Self-pay | Admitting: Radiation Therapy

## 2020-02-07 LAB — CBC WITH DIFFERENTIAL/PLATELET
Abs Immature Granulocytes: 0.05 10*3/uL (ref 0.00–0.07)
Basophils Absolute: 0 10*3/uL (ref 0.0–0.1)
Basophils Relative: 0 %
Eosinophils Absolute: 0.4 10*3/uL (ref 0.0–0.5)
Eosinophils Relative: 5 %
HCT: 31.8 % — ABNORMAL LOW (ref 39.0–52.0)
Hemoglobin: 10.6 g/dL — ABNORMAL LOW (ref 13.0–17.0)
Immature Granulocytes: 1 %
Lymphocytes Relative: 18 %
Lymphs Abs: 1.7 10*3/uL (ref 0.7–4.0)
MCH: 29.9 pg (ref 26.0–34.0)
MCHC: 33.3 g/dL (ref 30.0–36.0)
MCV: 89.6 fL (ref 80.0–100.0)
Monocytes Absolute: 1 10*3/uL (ref 0.1–1.0)
Monocytes Relative: 11 %
Neutro Abs: 6 10*3/uL (ref 1.7–7.7)
Neutrophils Relative %: 65 %
Platelets: 183 10*3/uL (ref 150–400)
RBC: 3.55 MIL/uL — ABNORMAL LOW (ref 4.22–5.81)
RDW: 14 % (ref 11.5–15.5)
WBC: 9.2 10*3/uL (ref 4.0–10.5)
nRBC: 0.2 % (ref 0.0–0.2)

## 2020-02-07 LAB — PROTIME-INR
INR: 1 (ref 0.8–1.2)
Prothrombin Time: 13 seconds (ref 11.4–15.2)

## 2020-02-07 LAB — GLUCOSE, CAPILLARY
Glucose-Capillary: 142 mg/dL — ABNORMAL HIGH (ref 70–99)
Glucose-Capillary: 118 mg/dL — ABNORMAL HIGH (ref 70–99)
Glucose-Capillary: 123 mg/dL — ABNORMAL HIGH (ref 70–99)
Glucose-Capillary: 213 mg/dL — ABNORMAL HIGH (ref 70–99)
Glucose-Capillary: 200 mg/dL — ABNORMAL HIGH (ref 70–99)

## 2020-02-07 LAB — APTT: aPTT: 33 seconds (ref 24–36)

## 2020-02-07 LAB — PREPARE RBC (CROSSMATCH)

## 2020-02-07 MED ORDER — TRANEXAMIC ACID-NACL 1000-0.7 MG/100ML-% IV SOLN
1000.0000 mg | INTRAVENOUS | Status: AC
  Administered 2020-02-08: 1000 mg via INTRAVENOUS
  Filled 2020-02-07: qty 100

## 2020-02-07 MED ORDER — CEFAZOLIN SODIUM-DEXTROSE 2-4 GM/100ML-% IV SOLN
2.0000 g | INTRAVENOUS | Status: AC
  Administered 2020-02-08: 2 g via INTRAVENOUS
  Filled 2020-02-07: qty 100

## 2020-02-07 NOTE — Progress Notes (Signed)
Patient ID: Harold Torres, male   DOB: 11-24-1975, 44 y.o.   MRN: [id number] BP 108/79 (BP Location: Left Arm)   Pulse 88   Temp 98.8 F (37.1 C) (Oral)   Resp 18   Ht 5\' 5"  (1.651 m)   Wt 89.2 kg   SpO2 97%   BMI 32.72 kg/m   Harold Torres will be taken to the operating room for removal of skull with tumor involvement. 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 The Tumor board team has recommended and I agree that whatever tumor burden we can remove safely will be helpful given the aggressive nature of the tumor.  Alert and oriented x 4, speech is clear, fluent Moving all extremities well Wound is clean, dry, and without signs of infection.  OR tomorrow for tumor.

## 2020-02-08 ENCOUNTER — Inpatient Hospital Stay (HOSPITAL_COMMUNITY): Payer: 59 | Admitting: Anesthesiology

## 2020-02-08 ENCOUNTER — Encounter (HOSPITAL_COMMUNITY)
Admission: RE | Disposition: A | Discharge status: Home / Self Care | Payer: Self-pay | Source: Home / Self Care | Attending: Neurosurgery

## 2020-02-08 ENCOUNTER — Encounter (HOSPITAL_COMMUNITY): Payer: Self-pay | Admitting: Neurosurgery

## 2020-02-08 DIAGNOSIS — D320 Benign neoplasm of cerebral meninges: Secondary | ICD-10-CM | POA: Diagnosis present

## 2020-02-08 HISTORY — PX: CRANIOTOMY: SHX93

## 2020-02-08 LAB — GLUCOSE, CAPILLARY
Glucose-Capillary: 112 mg/dL — ABNORMAL HIGH (ref 70–99)
Glucose-Capillary: 122 mg/dL — ABNORMAL HIGH (ref 70–99)
Glucose-Capillary: 139 mg/dL — ABNORMAL HIGH (ref 70–99)
Glucose-Capillary: 184 mg/dL — ABNORMAL HIGH (ref 70–99)
Glucose-Capillary: 227 mg/dL — ABNORMAL HIGH (ref 70–99)

## 2020-02-08 SURGERY — CRANIOTOMY TUMOR EXCISION
Anesthesia: General | Site: Head

## 2020-02-08 MED ORDER — POTASSIUM CHLORIDE IN NACL 20-0.9 MEQ/L-% IV SOLN
INTRAVENOUS | Status: DC
  Filled 2020-02-08 (×6): qty 1000

## 2020-02-08 MED ORDER — OXYCODONE HCL 5 MG/5ML PO SOLN: 5.0000 mg | Freq: Once | ORAL | Status: DC | PRN

## 2020-02-08 MED ORDER — PROPOFOL 10 MG/ML IV BOLUS
INTRAVENOUS | Status: AC
  Filled 2020-02-08: qty 20

## 2020-02-08 MED ORDER — HYDROMORPHONE HCL 1 MG/ML IJ SOLN: 0.2500 mg | INTRAMUSCULAR | Status: DC | PRN

## 2020-02-08 MED ORDER — ROCURONIUM BROMIDE 10 MG/ML (PF) SYRINGE
PREFILLED_SYRINGE | INTRAVENOUS | Status: AC
  Filled 2020-02-08: qty 10

## 2020-02-08 MED ORDER — PHENYLEPHRINE 40 MCG/ML (10ML) SYRINGE FOR IV PUSH (FOR BLOOD PRESSURE SUPPORT)
PREFILLED_SYRINGE | INTRAVENOUS | Status: DC | PRN
  Administered 2020-02-08 (×2): 80 ug via INTRAVENOUS

## 2020-02-08 MED ORDER — THROMBIN 5000 UNITS EX SOLR
OROMUCOSAL | Status: DC | PRN
  Administered 2020-02-08: 5 mL

## 2020-02-08 MED ORDER — PROPOFOL 10 MG/ML IV BOLUS
INTRAVENOUS | Status: DC | PRN
  Administered 2020-02-08: 150 mg via INTRAVENOUS

## 2020-02-08 MED ORDER — SUGAMMADEX SODIUM 200 MG/2ML IV SOLN
INTRAVENOUS | Status: DC | PRN
  Administered 2020-02-08: 300 mg via INTRAVENOUS

## 2020-02-08 MED ORDER — THROMBIN 20000 UNITS EX SOLR
CUTANEOUS | Status: AC
  Filled 2020-02-08: qty 20000

## 2020-02-08 MED ORDER — ACETAMINOPHEN 10 MG/ML IV SOLN
INTRAVENOUS | Status: AC
  Filled 2020-02-08: qty 100

## 2020-02-08 MED ORDER — BACITRACIN ZINC 500 UNIT/GM EX OINT
TOPICAL_OINTMENT | CUTANEOUS | Status: DC | PRN
  Administered 2020-02-08: 1 via TOPICAL

## 2020-02-08 MED ORDER — HYDROCODONE-ACETAMINOPHEN 5-325 MG PO TABS
1.0000 | ORAL_TABLET | ORAL | Status: DC | PRN
  Administered 2020-02-09 – 2020-02-11 (×3): 1 via ORAL
  Filled 2020-02-08: qty 1

## 2020-02-08 MED ORDER — CHLORHEXIDINE GLUCONATE CLOTH 2 % EX PADS
6.0000 | MEDICATED_PAD | Freq: Every day | CUTANEOUS | Status: DC
  Administered 2020-02-08 – 2020-02-11 (×4): 6 via TOPICAL

## 2020-02-08 MED ORDER — ONDANSETRON HCL 4 MG/2ML IJ SOLN
INTRAMUSCULAR | Status: DC | PRN
  Administered 2020-02-08: 4 mg via INTRAVENOUS

## 2020-02-08 MED ORDER — 0.9 % SODIUM CHLORIDE (POUR BTL) OPTIME
TOPICAL | Status: DC | PRN
  Administered 2020-02-08: 3000 mL

## 2020-02-08 MED ORDER — THROMBIN 5000 UNITS EX SOLR
CUTANEOUS | Status: AC
  Filled 2020-02-08: qty 10000

## 2020-02-08 MED ORDER — LIDOCAINE 2% (20 MG/ML) 5 ML SYRINGE
INTRAMUSCULAR | Status: DC | PRN
  Administered 2020-02-08: 60 mg via INTRAVENOUS

## 2020-02-08 MED ORDER — MIDAZOLAM HCL 5 MG/5ML IJ SOLN
INTRAMUSCULAR | Status: DC | PRN
  Administered 2020-02-08: 1 mg via INTRAVENOUS

## 2020-02-08 MED ORDER — PHENYLEPHRINE 40 MCG/ML (10ML) SYRINGE FOR IV PUSH (FOR BLOOD PRESSURE SUPPORT)
PREFILLED_SYRINGE | INTRAVENOUS | Status: AC
  Filled 2020-02-08: qty 10

## 2020-02-08 MED ORDER — MORPHINE SULFATE (PF) 2 MG/ML IV SOLN: 1.0000 mg | INTRAVENOUS | Status: DC | PRN

## 2020-02-08 MED ORDER — DEXAMETHASONE SODIUM PHOSPHATE 10 MG/ML IJ SOLN
INTRAMUSCULAR | Status: DC | PRN
  Administered 2020-02-08: 8 mg via INTRAVENOUS

## 2020-02-08 MED ORDER — LIDOCAINE 2% (20 MG/ML) 5 ML SYRINGE
INTRAMUSCULAR | Status: AC
  Filled 2020-02-08: qty 5

## 2020-02-08 MED ORDER — CEFAZOLIN SODIUM-DEXTROSE 1-4 GM/50ML-% IV SOLN
1.0000 g | Freq: Three times a day (TID) | INTRAVENOUS | Status: DC
  Administered 2020-02-08 – 2020-02-12 (×11): 1 g via INTRAVENOUS
  Filled 2020-02-08 (×16): qty 50

## 2020-02-08 MED ORDER — MIDAZOLAM HCL 2 MG/2ML IJ SOLN
INTRAMUSCULAR | Status: AC
  Filled 2020-02-08: qty 2

## 2020-02-08 MED ORDER — NALOXONE HCL 0.4 MG/ML IJ SOLN: 0.0800 mg | INTRAMUSCULAR | Status: DC | PRN

## 2020-02-08 MED ORDER — ONDANSETRON HCL 4 MG/2ML IJ SOLN
INTRAMUSCULAR | Status: AC
  Filled 2020-02-08: qty 2

## 2020-02-08 MED ORDER — LACTATED RINGERS IV SOLN: INTRAVENOUS | Status: DC | PRN

## 2020-02-08 MED ORDER — DEXAMETHASONE SODIUM PHOSPHATE 10 MG/ML IJ SOLN
INTRAMUSCULAR | Status: AC
  Filled 2020-02-08: qty 1

## 2020-02-08 MED ORDER — FENTANYL CITRATE (PF) 250 MCG/5ML IJ SOLN
INTRAMUSCULAR | Status: DC | PRN
  Administered 2020-02-08: 50 ug via INTRAVENOUS
  Administered 2020-02-08 (×2): 100 ug via INTRAVENOUS

## 2020-02-08 MED ORDER — OXYCODONE HCL 5 MG PO TABS: 5.0000 mg | ORAL_TABLET | Freq: Once | ORAL | Status: DC | PRN

## 2020-02-08 MED ORDER — BACITRACIN ZINC 500 UNIT/GM EX OINT
TOPICAL_OINTMENT | CUTANEOUS | Status: AC
  Filled 2020-02-08: qty 28.35

## 2020-02-08 MED ORDER — FENTANYL CITRATE (PF) 250 MCG/5ML IJ SOLN
INTRAMUSCULAR | Status: AC
  Filled 2020-02-08: qty 5

## 2020-02-08 MED ORDER — ROCURONIUM BROMIDE 10 MG/ML (PF) SYRINGE
PREFILLED_SYRINGE | INTRAVENOUS | Status: DC | PRN
  Administered 2020-02-08: 60 mg via INTRAVENOUS
  Administered 2020-02-08: 20 mg via INTRAVENOUS

## 2020-02-08 MED ORDER — THROMBIN 20000 UNITS EX SOLR
CUTANEOUS | Status: DC | PRN
  Administered 2020-02-08: 20 mL

## 2020-02-08 MED ORDER — ACETAMINOPHEN 10 MG/ML IV SOLN
1000.0000 mg | Freq: Once | INTRAVENOUS | Status: DC | PRN
  Administered 2020-02-08: 1000 mg via INTRAVENOUS

## 2020-02-08 MED ORDER — LIDOCAINE-EPINEPHRINE 0.5 %-1:200000 IJ SOLN
INTRAMUSCULAR | Status: AC
  Filled 2020-02-08: qty 1

## 2020-02-08 MED ORDER — PROMETHAZINE HCL 25 MG/ML IJ SOLN: 6.2500 mg | INTRAMUSCULAR | Status: DC | PRN

## 2020-02-08 MED ORDER — CHLORHEXIDINE GLUCONATE 0.12 % MT SOLN
OROMUCOSAL | Status: AC
  Filled 2020-02-08: qty 15

## 2020-02-08 SURGICAL SUPPLY — 79 items
BAND RUBBER #18 3X1/16 STRL (MISCELLANEOUS) IMPLANT
BENZOIN TINCTURE PRP APPL 2/3 (GAUZE/BANDAGES/DRESSINGS) IMPLANT
BLADE CLIPPER SURG (BLADE) ×3 IMPLANT
BLADE SAW GIGLI 16 STRL (MISCELLANEOUS) IMPLANT
BLADE SURG 15 STRL LF DISP TIS (BLADE) IMPLANT
BLADE SURG 15 STRL SS (BLADE)
BLADE ULTRA TIP 2M (BLADE) IMPLANT
BNDG GAUZE ELAST 4 BULKY (GAUZE/BANDAGES/DRESSINGS) IMPLANT
BUR ACORN 6.0 PRECISION (BURR) ×2 IMPLANT
BUR ACORN 6.0MM PRECISION (BURR) ×1
BUR MATCHSTICK NEURO 3.0 LAGG (BURR) IMPLANT
BUR SPIRAL ROUTER 2.3 (BUR) IMPLANT
BUR SPIRAL ROUTER 2.3MM (BUR)
CANISTER SUCT 3000ML PPV (MISCELLANEOUS) ×3 IMPLANT
CARTRIDGE OIL MAESTRO DRILL (MISCELLANEOUS) ×1 IMPLANT
CATH VENTRIC 35X38 W/TROCAR LG (CATHETERS) IMPLANT
CLIP VESOCCLUDE MED 6/CT (CLIP) IMPLANT
CNTNR URN SCR LID CUP LEK RST (MISCELLANEOUS) ×1 IMPLANT
CONT SPEC 4OZ STRL OR WHT (MISCELLANEOUS) ×2
COVER WAND RF STERILE (DRAPES) ×3 IMPLANT
DECANTER SPIKE VIAL GLASS SM (MISCELLANEOUS) ×3 IMPLANT
DIFFUSER DRILL AIR PNEUMATIC (MISCELLANEOUS) ×3 IMPLANT
DRAIN SUBARACHNOID (WOUND CARE) IMPLANT
DRAPE CAMERA VIDEO/LASER (DRAPES) IMPLANT
DRAPE MICROSCOPE LEICA (MISCELLANEOUS) IMPLANT
DRAPE NEUROLOGICAL W/INCISE (DRAPES) ×3 IMPLANT
DRAPE ORTHO SPLIT 77X108 STRL (DRAPES)
DRAPE SURG 17X23 STRL (DRAPES) IMPLANT
DRAPE SURG ORHT 6 SPLT 77X108 (DRAPES) IMPLANT
DRAPE WARM FLUID 44X44 (DRAPES) ×3 IMPLANT
DRSG TELFA 3X8 NADH (GAUZE/BANDAGES/DRESSINGS) ×3 IMPLANT
DURAPREP 6ML APPLICATOR 50/CS (WOUND CARE) ×3 IMPLANT
ELECT REM PT RETURN 9FT ADLT (ELECTROSURGICAL) ×3
ELECTRODE REM PT RTRN 9FT ADLT (ELECTROSURGICAL) ×1 IMPLANT
EVACUATOR 1/8 PVC DRAIN (DRAIN) ×3 IMPLANT
EVACUATOR SILICONE 100CC (DRAIN) IMPLANT
GAUZE 4X4 16PLY RFD (DISPOSABLE) IMPLANT
GAUZE SPONGE 4X4 12PLY STRL (GAUZE/BANDAGES/DRESSINGS) ×3 IMPLANT
GLOVE ECLIPSE 6.5 STRL STRAW (GLOVE) ×3 IMPLANT
GLOVE EXAM NITRILE XL STR (GLOVE) IMPLANT
GOWN STRL REUS W/ TWL LRG LVL3 (GOWN DISPOSABLE) ×2 IMPLANT
GOWN STRL REUS W/ TWL XL LVL3 (GOWN DISPOSABLE) IMPLANT
GOWN STRL REUS W/TWL 2XL LVL3 (GOWN DISPOSABLE) IMPLANT
GOWN STRL REUS W/TWL LRG LVL3 (GOWN DISPOSABLE) ×4
GOWN STRL REUS W/TWL XL LVL3 (GOWN DISPOSABLE)
HEMOSTAT SURGICEL 2X14 (HEMOSTASIS) IMPLANT
HOOK DURA 1/2IN (MISCELLANEOUS) ×3 IMPLANT
KIT BASIN OR (CUSTOM PROCEDURE TRAY) ×3 IMPLANT
KIT DRAIN CSF ACCUDRAIN (MISCELLANEOUS) IMPLANT
KIT TURNOVER KIT B (KITS) ×3 IMPLANT
NEEDLE HYPO 25X1 1.5 SAFETY (NEEDLE) ×3 IMPLANT
NEEDLE SPNL 18GX3.5 QUINCKE PK (NEEDLE) IMPLANT
NS IRRIG 1000ML POUR BTL (IV SOLUTION) ×3 IMPLANT
OIL CARTRIDGE MAESTRO DRILL (MISCELLANEOUS) ×3
PACK CRANIOTOMY CUSTOM (CUSTOM PROCEDURE TRAY) ×3 IMPLANT
PATTIES SURGICAL .25X.25 (GAUZE/BANDAGES/DRESSINGS) IMPLANT
PATTIES SURGICAL .5 X.5 (GAUZE/BANDAGES/DRESSINGS) IMPLANT
PATTIES SURGICAL .5 X3 (DISPOSABLE) IMPLANT
PATTIES SURGICAL 1/4 X 3 (GAUZE/BANDAGES/DRESSINGS) IMPLANT
PATTIES SURGICAL 1X1 (DISPOSABLE) IMPLANT
SPECIMEN JAR SMALL (MISCELLANEOUS) IMPLANT
SPONGE NEURO XRAY DETECT 1X3 (DISPOSABLE) IMPLANT
SPONGE SURGIFOAM ABS GEL 100 (HEMOSTASIS) ×3 IMPLANT
STAPLER VISISTAT 35W (STAPLE) ×3 IMPLANT
SUT ETHILON 2 0 PSLX (SUTURE) ×6 IMPLANT
SUT ETHILON 3 0 FSL (SUTURE) IMPLANT
SUT ETHILON 3 0 PS 1 (SUTURE) IMPLANT
SUT NURALON 4 0 TR CR/8 (SUTURE) ×9 IMPLANT
SUT SILK 0 TIES 10X30 (SUTURE) IMPLANT
SUT STEEL 0 (SUTURE)
SUT STEEL 0 18XMFL TIE 17 (SUTURE) IMPLANT
SUT VIC AB 2-0 CT2 18 VCP726D (SUTURE) ×6 IMPLANT
TOWEL GREEN STERILE (TOWEL DISPOSABLE) ×3 IMPLANT
TOWEL GREEN STERILE FF (TOWEL DISPOSABLE) ×3 IMPLANT
TRAY FOLEY MTR SLVR 16FR STAT (SET/KITS/TRAYS/PACK) ×3 IMPLANT
TUBE CONNECTING 12'X1/4 (SUCTIONS) ×1
TUBE CONNECTING 12X1/4 (SUCTIONS) ×2 IMPLANT
UNDERPAD 30X36 HEAVY ABSORB (UNDERPADS AND DIAPERS) ×3 IMPLANT
WATER STERILE IRR 1000ML POUR (IV SOLUTION) ×3 IMPLANT

## 2020-02-08 NOTE — Anesthesia Procedure Notes (Signed)
Procedure Name: Intubation Date/Time: 02/08/2020 10:34 AM Performed by: Mariea Clonts, CRNA Pre-anesthesia Checklist: Patient identified, Emergency Drugs available, Suction available and Patient being monitored Patient Re-evaluated:Patient Re-evaluated prior to induction Oxygen Delivery Method: Circle System Utilized Preoxygenation: Pre-oxygenation with 100% oxygen Induction Type: IV induction Ventilation: Mask ventilation without difficulty Laryngoscope Size: Miller and 2 Grade View: Grade I Tube type: Oral Tube size: 7.5 mm Number of attempts: 1 Airway Equipment and Method: Stylet and Oral airway Placement Confirmation: ETT inserted through vocal cords under direct vision,  positive ETCO2 and breath sounds checked- equal and bilateral Tube secured with: Tape Dental Injury: Teeth and Oropharynx as per pre-operative assessment

## 2020-02-08 NOTE — Progress Notes (Signed)
Pt arrived back to 4NP14. Pt A&Ox4 and vital signs WNL. No numbness or tingling and says pain is 2/10 where surgery was done.   Justice Rocher, RN

## 2020-02-08 NOTE — Op Note (Signed)
02/08/2020  5:50 PM  PATIENT:  Harold Torres  44 y.o. male  PRE-OPERATIVE DIAGNOSIS:  Tumor, Rhabdoid meningioma lll  POST-OPERATIVE DIAGNOSIS:  Tumor, Rhabdoid meningioma lll  PROCEDURE:  Procedure(s): CRANIECTOMY FOR TUMOR  SURGEON: Surgeon(s): Ashok Pall, MD Dawley, Theodoro Doing, DO  ASSISTANTS:Dawley, Pieter Partridge  ANESTHESIA:   general  EBL:  Total I/O In: 2250 [P.O.:50; I.V.:1600; Other:300; IV Piggyback:300] Out: 1800 [Urine:1500; Blood:300]  BLOOD ADMINISTERED:none  CELL SAVER GIVEN:none  COUNT:per nursing  DRAINS: (1) Hemovact drain(s) in the sugaleal space with  Suction Open   SPECIMEN:  Known diagnosis, no specimen sent to lab  DICTATION: [name] was taken to the operating room, intubated, and placed under a general anesthetic without difficulty. He was positioned prone with his head supported by a horseshoe headrest. His head was prepped and draped in a sterile manner. I removed the staples and sutures from his wound. I placed retractors and some Raney clips on the scalp edges. With the resection site exposed we removed old clotted blood. We then created a space between the bony edges and the dura. We removed bone with various instruments around the edges of the tumor infiltration. When we made it to relatively healthy bone we stopped. The goal was to remove more of the tumor infiltrated bone without causing more problems. Once I was satisfied we irrigated, achieved hemostasis, and started to close. I placed a hemovac drain in the subgaleal space. We closed with 3-0 nylon suture. A sterile dressing was placed. We rolled him onto the bed where he was extubated easily.   PLAN OF CARE: Admit to inpatient   PATIENT DISPOSITION:  PACU - hemodynamically stable.   Delay start of Pharmacological VTE agent (>24hrs) due to surgical blood loss or risk of bleeding:  yes

## 2020-02-08 NOTE — Anesthesia Procedure Notes (Signed)
Arterial Line Insertion Start/End8/12/2019 10:20 AM, 02/08/2020 10:30 AM Performed by: CRNA  Patient location: Pre-op. Preanesthetic checklist: patient identified, IV checked, site marked, risks and benefits discussed, surgical consent, monitors and equipment checked, pre-op evaluation, timeout performed and anesthesia consent Lidocaine 1% used for infiltration Right, radial was placed Catheter size: 20 G Hand hygiene performed  and maximum sterile barriers used   Attempts: 1 Procedure performed without using ultrasound guided technique. Following insertion, dressing applied and Biopatch. Post procedure assessment: normal  Patient tolerated the procedure well with no immediate complications.

## 2020-02-08 NOTE — Anesthesia Preprocedure Evaluation (Addendum)
Anesthesia Evaluation  Patient identified by MRN, date of birth, ID band Patient awake    Reviewed: Allergy & Precautions, NPO status , Patient's Chart, lab work & pertinent test results  Airway Mallampati: III  TM Distance: >3 FB Neck ROM: Full    Dental no notable dental hx.    Pulmonary former smoker,    Pulmonary exam normal breath sounds clear to auscultation       Cardiovascular negative cardio ROS Normal cardiovascular exam Rhythm:Regular Rate:Normal  ECG: NSR, rate 65   Neuro/Psych Meningioma     GI/Hepatic negative GI ROS, (+) Hepatitis -  Endo/Other  diabetes, Oral Hypoglycemic Agents  Renal/GU negative Renal ROS     Musculoskeletal negative musculoskeletal ROS (+)   Abdominal (+) + obese,   Peds  Hematology  (+) anemia ,   Anesthesia Other Findings Tumor  Reproductive/Obstetrics                            Anesthesia Physical Anesthesia Plan  ASA: II  Anesthesia Plan: General   Post-op Pain Management:    Induction: Intravenous  PONV Risk Score and Plan: 2 and Ondansetron, Dexamethasone and Treatment may vary due to age or medical condition  Airway Management Planned: Oral ETT  Additional Equipment: Arterial line  Intra-op Plan:   Post-operative Plan: Extubation in OR  Informed Consent: I have reviewed the patients History and Physical, chart, labs and discussed the procedure including the risks, benefits and alternatives for the proposed anesthesia with the patient or authorized representative who has indicated his/her understanding and acceptance.     Dental advisory given and Interpreter used for interveiw  Plan Discussed with: CRNA  Anesthesia Plan Comments: (Potential central line placement discussed. )      Anesthesia Quick Evaluation

## 2020-02-08 NOTE — Progress Notes (Signed)
Unable to contact interpreter this am after 3 attempts. Pt does know enough English to show that he is oriented and not in pain.   Report called to short stay. CBG taken this am at 122, Heparin has been held. Night shift RN, Mitzi, gave first CHG bath and filled out pre-procedure checklist. Second CHG bath given by this RN this am. Pt's spouse, who speaks [place], was contacted this am and agrees to sign consent for the pt when she arrives. She was given the number for short stay and will sign down there upon arrival.   Pt left unit with no jewelry or belongings on and confirmed that he has not eaten or drank anything this am.   Justice Rocher, RN

## 2020-02-08 NOTE — Transfer of Care (Signed)
Immediate Anesthesia Transfer of Care Note  Patient: Harold Torres  Procedure(s) Performed: CRANIECTOMY FOR TUMOR (N/A Head)  Patient Location: PACU  Anesthesia Type:General  Level of Consciousness: awake, alert  and oriented  Airway & Oxygen Therapy: Patient Spontanous Breathing and Patient connected to nasal cannula oxygen  Post-op Assessment: Report given to RN, Post -op Vital signs reviewed and stable and Patient moving all extremities X 4  Post vital signs: Reviewed and stable  Last Vitals:  Vitals Value Taken Time  BP 117/82 02/08/20 1300  Temp    Pulse 81 02/08/20 1305  Resp 16 02/08/20 1305  SpO2 99 % 02/08/20 1305  Vitals shown include unvalidated device data.  Last Pain:  Vitals:   02/08/20 0817  TempSrc: Oral  PainSc:       Patients Stated Pain Goal: 3 ([birth year] 7062)  Complications: No complications documented.

## 2020-02-08 NOTE — Progress Notes (Signed)
PT Cancellation Note  Patient Details Name: Jazen Spraggins MRN: [id number] DOB: [birth year]   Cancelled Treatment:    Reason Eval/Treat Not Completed: Patient at procedure or test/unavailable Pt in OR. Will follow.   Marguarite Arbour A Fynn Adel 02/08/2020, 10:39 AM Marisa Severin, PT, DPT Acute Rehabilitation Services Pager [phone] Office [phone]

## 2020-02-09 ENCOUNTER — Inpatient Hospital Stay (HOSPITAL_COMMUNITY): Payer: 59

## 2020-02-09 LAB — GLUCOSE, CAPILLARY
Glucose-Capillary: 112 mg/dL — ABNORMAL HIGH (ref 70–99)
Glucose-Capillary: 113 mg/dL — ABNORMAL HIGH (ref 70–99)
Glucose-Capillary: 114 mg/dL — ABNORMAL HIGH (ref 70–99)
Glucose-Capillary: 146 mg/dL — ABNORMAL HIGH (ref 70–99)
Glucose-Capillary: 186 mg/dL — ABNORMAL HIGH (ref 70–99)
Glucose-Capillary: 205 mg/dL — ABNORMAL HIGH (ref 70–99)
Glucose-Capillary: 209 mg/dL — ABNORMAL HIGH (ref 70–99)
Glucose-Capillary: 212 mg/dL — ABNORMAL HIGH (ref 70–99)
Glucose-Capillary: 246 mg/dL — ABNORMAL HIGH (ref 70–99)

## 2020-02-09 MED ORDER — GADOBUTROL 1 MMOL/ML IV SOLN
9.0000 mL | Freq: Once | INTRAVENOUS | Status: AC | PRN
  Administered 2020-02-09: 9 mL via INTRAVENOUS

## 2020-02-09 NOTE — Progress Notes (Signed)
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 ETT 7/29-8/1. Pt now s/p craniectomy for tumor on 8/6. PMH includes DM, hep B.    PT Comments    Pt seen for re-evaluation following craniectomy for tumor on 8/6 by Dr. Christella Noa. Pt continuing to move very well overall at a mod I to supervision level with all functional mobility. He tolerated hallway ambulation without difficulties and no reported pain. Of note, pt did mention that his vision was blurry with up close things, such as when looking at his phone. No other complaints at this time. Pt would continue to benefit from skilled physical therapy services at this time while admitted and after d/c to address the below listed limitations in order to improve overall safety and independence with functional mobility.  Mandarin interpreter utilized throughout [facility] [phone]).    Follow Up Recommendations  Supervision for mobility/OOB;No PT follow up     Equipment Recommendations  None recommended by PT    Recommendations for Other Services       Precautions / Restrictions Precautions Precautions: Fall Precaution Comments: drain at [place] site Restrictions Weight Bearing Restrictions: No    Mobility  Bed Mobility Overal bed mobility: Modified Independent                Transfers Overall transfer level: Modified independent Equipment used: None                Ambulation/Gait Ambulation/Gait assistance: Supervision Gait Distance (Feet): 200 Feet Assistive device: None Gait Pattern/deviations: Step-through pattern Gait velocity: reduced   General Gait Details: pt steady overall with ambulation in hallway, able to navigate around obstacles easily without any LOB or need for  physical assistance   Stairs             Wheelchair Mobility    Modified Rankin (Stroke Patients Only)       Balance Overall balance assessment: Needs assistance Sitting-balance support: No upper extremity supported;Feet unsupported Sitting balance-Leahy Scale: Good     Standing balance support: During functional activity;No upper extremity supported Standing balance-Leahy Scale: Good                              Cognition Arousal/Alertness: Awake/alert Behavior During Therapy: WFL for tasks assessed/performed Overall Cognitive Status: Within Functional Limits for tasks assessed                                        Exercises      General Comments        Pertinent Vitals/Pain Pain Assessment: No/denies pain    Home Living                      Prior Function            PT Goals (current goals can now be found in the care plan section) Acute Rehab PT Goals PT Goal Formulation: With patient Time For Goal Achievement: 02/18/20 Potential to Achieve Goals: Good Progress towards PT goals: Progressing toward goals    Frequency    Min 3X/week      PT Plan Current plan remains appropriate  Co-evaluation              AM-PAC PT "6 Clicks" Mobility   Outcome Measure  Help needed turning from your back to your side while in a flat bed without using bedrails?: None Help needed moving from lying on your back to sitting on the side of a flat bed without using bedrails?: None Help needed moving to and from a bed to a chair (including a wheelchair)?: None Help needed standing up from a chair using your arms (e.g., wheelchair or bedside chair)?: None Help needed to walk in hospital room?: None Help needed climbing 3-5 steps with a railing? : A Little 6 Click Score: 23    End of Session   Activity Tolerance: Patient tolerated treatment well Patient left: in bed;with call bell/phone within reach Nurse  Communication: Mobility status PT Visit Diagnosis: Other abnormalities of gait and mobility (R26.89);Muscle weakness (generalized) (M62.81)     Time: 5997-7414 PT Time Calculation (min) (ACUTE ONLY): 19 min  Charges:                        Anastasio Champion, DPT  Acute Rehabilitation Services Pager [phone] Office [place] 02/09/2020, 2:51 PM

## 2020-02-09 NOTE — Progress Notes (Signed)
   Providing Compassionate, Quality Care - Together  NEUROSURGERY PROGRESS NOTE   S: No issues overnight. No complaints  O: EXAM:  BP 98/73 (BP Location: Right Arm)   Pulse 72   Temp 97.6 F (36.4 C)   Resp 20   Ht 5\' 5"  (1.651 m)   Wt 89.2 kg   SpO2 93%   BMI 32.72 kg/m   Awake, alert, oriented  Speech fluent, appropriate  Face symmetric Full strength in all extremities Incision clean dry and intact Hemovac in place  ASSESSMENT:  44 y.o. male with  1.  Rhabdoid meningioma  -Status post Crani x2 for tumor resection  PLAN: -Continue supportive care -Mobilize as tolerated -Pain control -DVT prophylaxis -Monitor Hemovac -Doing well neurologically    Please do not hesitate to call with questions or concerns.   Elwin Sleight, [facility] Cell: [phone]

## 2020-02-09 NOTE — Anesthesia Postprocedure Evaluation (Signed)
Anesthesia Post Note  Patient: Harold Torres  Procedure(s) Performed: CRANIECTOMY FOR TUMOR (N/A Head)     Patient location during evaluation: PACU Anesthesia Type: General Level of consciousness: awake Pain management: pain level controlled Vital Signs Assessment: post-procedure vital signs reviewed and stable Respiratory status: spontaneous breathing, nonlabored ventilation, respiratory function stable and patient connected to nasal cannula oxygen Cardiovascular status: blood pressure returned to baseline and stable Postop Assessment: no apparent nausea or vomiting Anesthetic complications: no   No complications documented.  Last Vitals:  Vitals:   02/09/20 0008 02/09/20 0442  BP: 101/68 105/65  Pulse: 81 75  Resp: 20 18  Temp: 36.9 C 37 C  SpO2: 97% 95%    Last Pain:  Vitals:   02/09/20 0442  TempSrc: Axillary  PainSc:                  Karyl Kinnier Westly Hinnant

## 2020-02-10 ENCOUNTER — Encounter (HOSPITAL_COMMUNITY): Payer: Self-pay | Admitting: Neurosurgery

## 2020-02-10 LAB — GLUCOSE, CAPILLARY
Glucose-Capillary: 117 mg/dL — ABNORMAL HIGH (ref 70–99)
Glucose-Capillary: 129 mg/dL — ABNORMAL HIGH (ref 70–99)
Glucose-Capillary: 182 mg/dL — ABNORMAL HIGH (ref 70–99)
Glucose-Capillary: 240 mg/dL — ABNORMAL HIGH (ref 70–99)
Glucose-Capillary: 188 mg/dL — ABNORMAL HIGH (ref 70–99)

## 2020-02-10 NOTE — Progress Notes (Signed)
Hemo Vac removed from head. Patient tolerated well. Incision looks good no new drainage noted and staples are in place. Patient has voided well after Foley was removed this morning. Patient walked about the room throughout the day with no distress noted.

## 2020-02-10 NOTE — Progress Notes (Signed)
   Providing Compassionate, Quality Care - Together  NEUROSURGERY PROGRESS NOTE   S: No issues overnight. Doing well, ambulating  O: EXAM:  BP 99/71 (BP Location: Right Arm)   Pulse 77   Temp 98.7 F (37.1 C) (Oral)   Resp 20   Ht 5\' 5"  (1.651 m)   Wt 89.2 kg   SpO2 96%   BMI 32.72 kg/m   Awake, alert, oriented  Speech fluent, appropriate  Face symmetric Full strength in all extremities Incision clean dry and intact Hemovac in place  ASSESSMENT:  44 y.o. male with  1.  Rhabdoid meningioma  -Status post Craniectomy x2 for tumor resection  PLAN: -Continue supportive care -Mobilize as tolerated -Pain control -DVT prophylaxis -dc Hemovac -Doing well neurologically    Please do not hesitate to call with questions or concerns.   Elwin Sleight, [facility] Cell: [phone]

## 2020-02-11 ENCOUNTER — Inpatient Hospital Stay (HOSPITAL_COMMUNITY): Payer: 59

## 2020-02-11 ENCOUNTER — Inpatient Hospital Stay: Payer: 59 | Attending: Neurosurgery

## 2020-02-11 LAB — BPAM RBC
Blood Product Expiration Date: 202108302359
Blood Product Expiration Date: 202109092359
Blood Product Expiration Date: 202109092359
Blood Product Expiration Date: 202109092359
ISSUE DATE / TIME: 202108061042
ISSUE DATE / TIME: 202108061042
ISSUE DATE / TIME: 202108061042
ISSUE DATE / TIME: 202108061042
Unit Type and Rh: 1700
Unit Type and Rh: 7300
Unit Type and Rh: 7300
Unit Type and Rh: 7300

## 2020-02-11 LAB — TYPE AND SCREEN
ABO/RH(D): B POS
Antibody Screen: NEGATIVE
Unit division: 0
Unit division: 0
Unit division: 0
Unit division: 0

## 2020-02-11 LAB — GLUCOSE, CAPILLARY
Glucose-Capillary: 122 mg/dL — ABNORMAL HIGH (ref 70–99)
Glucose-Capillary: 170 mg/dL — ABNORMAL HIGH (ref 70–99)
Glucose-Capillary: 196 mg/dL — ABNORMAL HIGH (ref 70–99)
Glucose-Capillary: 231 mg/dL — ABNORMAL HIGH (ref 70–99)
Glucose-Capillary: 243 mg/dL — ABNORMAL HIGH (ref 70–99)
Glucose-Capillary: 243 mg/dL — ABNORMAL HIGH (ref 70–99)

## 2020-02-11 MED ORDER — HYDROCODONE-ACETAMINOPHEN 5-325 MG PO TABS: 1.0000 | ORAL_TABLET | ORAL | 0 refills | Status: DC | PRN

## 2020-02-11 MED ORDER — PROSOURCE PLUS PO LIQD
30.0000 mL | Freq: Two times a day (BID) | ORAL | Status: DC
  Administered 2020-02-12: 30 mL via ORAL
  Filled 2020-02-11 (×2): qty 30

## 2020-02-11 NOTE — Discharge Instructions (Signed)
Craniotomy °Care After °Please read the instructions outlined below and refer to this sheet in the next few weeks. These discharge instructions provide you with general information on caring for yourself after you leave the hospital. Your surgeon may also give you specific instructions. While your treatment has been planned according to the most current medical practices available, unavoidable complications occasionally occur. If you have any problems or questions after discharge, please call your surgeon. °Although there are many types of brain surgery, recovery following craniotomy (surgical opening of the skull) is much the same for each. However, recovery depends on many factors. These include the type and severity of brain injury and the type of surgery. It also depends on any nervous system function problems (neurological deficits) before surgery. If the craniotomy was done for cancer, chemotherapy and radiation could follow. You could be in the hospital from 5 days to a couple weeks. This depends on the type of surgery, findings, and whether there are complications. °HOME CARE INSTRUCTIONS  °· It is not unusual to hear a clicking noise after a craniotomy, the plates and screws used to attach the bone flap can sometimes cause this. It is a normal occurrence if this does happen °· Do not drive for 10 days after the operation °· Your scalp may feel spongy for a while, because of fluid under it. This will gradually get better. Occasionally, the surgeon will not replace the bone that was removed to access the brain. If there is a bony defect, the surgeon will ask you to wear a helmet for protection. This is a discussion you should have with your surgeon prior to leaving the hospital (discharge). °· Numbness may persist in some areas of your scalp. °· Take all medications as directed. Sometimes steroids to control swelling are prescribed. Anticonvulsants to prevent seizures may also be given. Do not use alcohol,  other drugs, or medications unless your surgeon says it is OK. °· Keep the wound dry and clean. The wound may be washed gently with soap and water. Then, you may gently blot or dab it dry, without rubbing. Do not take baths, use swimming pools or hot tubs for 10 days, or as instructed by your caregiver. It is best to wait to see you surgeon at your first postoperative visit, and to get directions at that time. °· Only take over-the-counter or prescription medicines for pain, discomfort, or fever as directed by your caregiver. °· You may continue your normal diet, as directed. °· Walking is OK for exercise. Wait at least 3 months before you return to mild, non-contact sports or as your surgeon suggests. Contact sports should be avoided for at least 1 year, unless your surgeon says it is OK. °· If you are prescribed steroids, take them exactly as prescribed. If you start having a decrease in nervous system functions (neurological deficits) and headaches as the dose of steroids is reduced, tell your surgeon right away. °· When the anticonvulsant prescription is finished you no longer need to take it. °SEEK IMMEDIATE MEDICAL CARE IF:  °· You develop nausea, vomiting, severe headaches, confusion, or you have a seizure. °· You develop chest pain, a stiff neck, or difficulty breathing. °· There is redness, swelling, or increasing pain in the wound or pin insertion sites. °· You have an increase in swelling or bruising around the eyes. °· There is drainage or pus coming from the wound. °· You have an oral temperature above 102° F (38.9° C), not controlled by medicine. °·   You notice a foul smell coming from the wound or dressing. °· The wound breaks open (edges not staying together) after the stitches have been removed. °· You develop dizziness or fainting while standing. °· You develop a rash. °· You develop any reaction or side effects to the medications given. °Document Released: 09/21/2005 Document Revised: 09/13/2011  Document Reviewed: 06/30/2009 °ExitCare® Patient Information ©2013 [employer]. ° °

## 2020-02-11 NOTE — Progress Notes (Signed)
Nutrition Follow-up  DOCUMENTATION CODES:   Not applicable  INTERVENTION:  Provide 30 ml Prosource plus po BID, each supplement provides 100 kcal and 15 grams of protein.   Encourage adequate PO intake.   NUTRITION DIAGNOSIS:   Inadequate oral intake related to inability to eat as evidenced by NPO status; diet advanced; improved  GOAL:   Patient will meet greater than or equal to 90% of their needs; progressed  MONITOR:   PO intake, Supplement acceptance, Skin, Weight trends, Labs, I & O's  REASON FOR ASSESSMENT:   Ventilator, Consult Enteral/tube feeding initiation and management  ASSESSMENT:   44 year old male who presented on 729 for tumor resection. PMH of T2DM and hepatitis B. Pt with Rhabdoid meningioma.  7/29 - s/p craniotomy and resection of hemangiopericytoma; Course complicated by large amount of bleeding requiring massive transfusion.  8/6 - s/p craniectomy for tumor  Meal completion has been 100%. Pt receiving food from home/outside. Pt tolerating his PO diet. RD to order Prosource plus nutritional supplements to aid in increased protein needs.   Labs and medications reviewed.   Diet Order:   Diet Order            Diet heart healthy/carb modified Room service appropriate? Yes; Fluid consistency: Thin  Diet effective now                 EDUCATION NEEDS:   No education needs have been identified at this time  Skin:  Skin Assessment: Skin Integrity Issues: Skin Integrity Issues:: Incisions Incisions: closed incision to head  Last BM:  8/9  Height:   Ht Readings from Last 1 Encounters:  02/08/20 5\' 5"  (1.651 m)    Weight:   Wt Readings from Last 1 Encounters:  02/11/20 83.5 kg   BMI:  Body mass index is 30.63 kg/m.  Estimated Nutritional Needs:   Kcal:  2300-2500  Protein:  100-115 grams  Fluid:  >/= 2.0 L  Corrin Parker, MS, RD, LDN RD pager number/after hours weekend pager number on Amion.

## 2020-02-11 NOTE — Progress Notes (Signed)
Patient ID: Harold Torres, male   DOB: [birth year], 44 y.o.   MRN: [id number] BP 103/70 (BP Location: Right Arm)   Pulse 88   Temp 98.7 F (37.1 C) (Oral)   Resp 16   Ht 5\' 5"  (1.651 m)   Wt 83.5 kg   SpO2 98%   BMI 30.63 kg/m  Alert, oriented speech is clear and fluent Moving all extremities well Will perform ct

## 2020-02-11 NOTE — Progress Notes (Signed)
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 ETT 7/29-8/1. Pt now s/p craniectomy for tumor on 8/6. PMH includes DM, hep B.    PT Comments    Pt making excellent progress.  He has met goals and reports he feels back to baseline mobility level.  Demonstrates excellent balance with perfect scores on DGI and Berg.  No further acute PT needs.    Follow Up Recommendations  No PT follow up     Equipment Recommendations  None recommended by PT    Recommendations for Other Services       Precautions / Restrictions Precautions Precautions: None    Mobility  Bed Mobility Overal bed mobility: Independent             General bed mobility comments: Pt up and ambulating in room at arrival  Transfers Overall transfer level: Independent                  Ambulation/Gait Ambulation/Gait assistance: Independent Gait Distance (Feet): 500 Feet Assistive device: None Gait Pattern/deviations: WFL(Within Functional Limits) Gait velocity: normal to fast       Stairs Stairs: Yes Stairs assistance: Supervision Stair Management: No rails;Alternating pattern;Forwards Number of Stairs: 5 (only limited # due to IV attached) General stair comments: without difficulty   Wheelchair Mobility    Modified Rankin (Stroke Patients Only)       Balance Overall balance assessment: Independent   Sitting balance-Leahy Scale: Normal       Standing balance-Leahy Scale: Normal                   Standardized Balance Assessment Standardized Balance Assessment : Berg Balance Test;Dynamic Gait Index Berg Balance Test Sit to Stand: Able to stand without using hands and stabilize independently Standing Unsupported: Able to  stand safely 2 minutes Sitting with Back Unsupported but Feet Supported on Floor or Stool: Able to sit safely and securely 2 minutes Stand to Sit: Sits safely with minimal use of hands Transfers: Able to transfer safely, minor use of hands Standing Unsupported with Eyes Closed: Able to stand 10 seconds safely Standing Ubsupported with Feet Together: Able to place feet together independently and stand 1 minute safely From Standing, Reach Forward with Outstretched Arm: Can reach confidently >25 cm (10") From Standing Position, Pick up Object from Floor: Able to pick up shoe safely and easily From Standing Position, Turn to Look Behind Over each Shoulder: Looks behind from both sides and weight shifts well Turn 360 Degrees: Able to turn 360 degrees safely in 4 seconds or less Standing Unsupported, Alternately Place Feet on Step/Stool: Able to stand independently and safely and complete 8 steps in 20 seconds Standing Unsupported, One Foot in Front: Able to place foot tandem independently and hold 30 seconds Standing on One Leg: Able to lift leg independently and hold > 10 seconds Total Score: 56 Dynamic Gait Index Level Surface: Normal Change in Gait Speed: Normal Gait with Horizontal Head Turns: Normal Gait with Vertical Head Turns: Normal Gait and Pivot Turn: Normal Step Over Obstacle: Normal Step Around Obstacles: Normal Steps: Normal Total Score: 24      Cognition Arousal/Alertness: Awake/alert Behavior During Therapy: WFL for tasks assessed/performed Overall Cognitive Status: Within Functional Limits for tasks assessed  General Comments: interpreter utilized Ned Card [phone]), pt appropriate with all commands      Exercises      General Comments General comments (skin integrity, edema, etc.): Pt reports feels back to normal mobility and balance.  Denies dizzines.  VSS.  Discussed would sign off PT - pt and wife agreeable.       Pertinent Vitals/Pain Pain Assessment: No/denies pain    Home Living                      Prior Function            PT Goals (current goals can now be found in the care plan section) Progress towards PT goals: Goals met/education completed, patient discharged from PT    Frequency           PT Plan Other (comment) (no further therapy needs)    Co-evaluation              AM-PAC PT "6 Clicks" Mobility   Outcome Measure  Help needed turning from your back to your side while in a flat bed without using bedrails?: None Help needed moving from lying on your back to sitting on the side of a flat bed without using bedrails?: None Help needed moving to and from a bed to a chair (including a wheelchair)?: None Help needed standing up from a chair using your arms (e.g., wheelchair or bedside chair)?: None Help needed to walk in hospital room?: None Help needed climbing 3-5 steps with a railing? : None 6 Click Score: 24    End of Session   Activity Tolerance: Patient tolerated treatment well Patient left: in bed Nurse Communication: Mobility status       Time: 1145-1200 PT Time Calculation (min) (ACUTE ONLY): 15 min  Charges:  $Gait Training: 8-22 mins                     Abran Richard, PT Acute Rehab Services Pager [phone] [facility] [phone]     Harold Torres 02/11/2020, 12:10 PM

## 2020-02-11 NOTE — Discharge Summary (Signed)
Physician Discharge Summary  Patient ID: Harold Torres MRN: [id number] DOB/AGE: 1976/04/21 44 y.o.  Admit date: 01/31/2020 Discharge date: 02/11/2020  Admission Diagnoses:skull mass  Discharge Diagnoses: Rhabdoid meningioma grade lll Active Problems:   Hemangiopericytoma   Meningioma determined by biopsy of brain [facility])   Discharged Condition: good  Hospital Course: Harold Torres was admitted and taken to the operating room for removal of a mass which was extracerebral and had invaded the skull and grew underneath the scalp. He had a significant blood loss on his first trip to the operating room, but did do very well post op. Most of the Rhabdoid meningioma grade lll, final pathology, was removed. However due to its aggressive nature, I took him back to the OR to remove more of the skull which was involved. He did very well with the second procedure too. He will be discharged home alert oriented x 4 with fluent speech. He is fluent in [place], and speaks english poorly. His wound is clean, dry, and without signs of infection. He has been evaluated by neurooncology, and radiation oncology. I will perform a planned cranioplasty before chemotherapy, and radiation.   Treatments: surgery: OCCIPITAL CRANIOTOMY FOR MASS, Stage 1  CRANIECTOMY FOR TUMOR    Discharge Exam: Blood pressure 103/70, pulse 88, temperature 98.7 F (37.1 C), temperature source Oral, resp. rate 16, height 5\' 5"  (1.651 m), weight 83.5 kg, SpO2 98 %. General appearance: alert, cooperative, appears stated age and no distress  Disposition: Discharge disposition: 01-Home or Self Care      Tumor    Follow-up Information    [facility] Follow up on 03/07/2020.   Why: Your appointment is at 1 pm with Harold Lacy NP. Please arrive early and bring a picture ID, current medications and insurance card. Contact information:  [place], [place]  [phone]       Care,  [facility] Follow up.   Specialty: [place] Why: The home health agency will contact you for the first home visit Contact information: [place] [address] [phone]        Ashok Pall, MD Follow up in 2 week(s).   Specialty: Neurosurgery Why: please call the office to make an appointment Contact information: [address] [place] [address] [place] [address] [phone]               Signed: Ashok Pall 02/11/2020, 6:47 PM

## 2020-02-12 LAB — GLUCOSE, CAPILLARY
Glucose-Capillary: 104 mg/dL — ABNORMAL HIGH (ref 70–99)
Glucose-Capillary: 124 mg/dL — ABNORMAL HIGH (ref 70–99)
Glucose-Capillary: 162 mg/dL — ABNORMAL HIGH (ref 70–99)

## 2020-02-12 NOTE — Progress Notes (Signed)
Patient IV has been removed. All information has been covered and questions answered using translation. Rx and D/C papers placed in a packet with patients belonging.

## 2020-02-19 ENCOUNTER — Telehealth: Payer: Self-pay | Admitting: Radiation Therapy

## 2020-02-19 NOTE — Telephone Encounter (Signed)
I spoke with the patient and his wife using the [employer]. He is aware of the consult appointment with Dr. Lisbeth Renshaw on 9/2 and has my contact information for questions. In addition to speaking with the patient, I left a voice mail for his niece to also make her aware of the upcomming visit with Dr. Lisbeth Renshaw.   Mont Dutton R.T.(R)(T) Radiation Special Procedures Navigator

## 2020-03-03 ENCOUNTER — Other Ambulatory Visit: Payer: Self-pay | Admitting: Neurosurgery

## 2020-03-06 ENCOUNTER — Ambulatory Visit: Payer: 59 | Admitting: Radiation Oncology

## 2020-03-06 ENCOUNTER — Ambulatory Visit: Payer: 59

## 2020-03-07 ENCOUNTER — Encounter: Payer: Self-pay | Admitting: Nurse Practitioner

## 2020-03-07 ENCOUNTER — Other Ambulatory Visit: Payer: Self-pay

## 2020-03-07 ENCOUNTER — Encounter: Payer: 59 | Admitting: Nurse Practitioner

## 2020-03-11 NOTE — Progress Notes (Signed)
Interpreter present. Harold Torres states he has a pcp and does not wish to change providers. He thinks there was a misunderstanding during his recent hospitalization. This encounter was created in error - please disregard.

## 2020-03-12 ENCOUNTER — Encounter (HOSPITAL_COMMUNITY): Payer: Self-pay

## 2020-03-12 ENCOUNTER — Other Ambulatory Visit: Payer: Self-pay

## 2020-03-12 ENCOUNTER — Other Ambulatory Visit (HOSPITAL_COMMUNITY)
Admission: RE | Admit: 2020-03-12 | Discharge: 2020-03-12 | Disposition: A | Discharge status: Home / Self Care | Payer: 59 | Source: Ambulatory Visit | Attending: Neurosurgery | Admitting: Neurosurgery

## 2020-03-12 ENCOUNTER — Encounter (HOSPITAL_COMMUNITY)
Admission: RE | Admit: 2020-03-12 | Discharge: 2020-03-12 | Disposition: A | Discharge status: Home / Self Care | Payer: 59 | Source: Ambulatory Visit | Attending: Neurosurgery | Admitting: Neurosurgery

## 2020-03-12 DIAGNOSIS — Z01812 Encounter for preprocedural laboratory examination: Secondary | ICD-10-CM | POA: Insufficient documentation

## 2020-03-12 DIAGNOSIS — Z20822 Contact with and (suspected) exposure to covid-19: Secondary | ICD-10-CM | POA: Insufficient documentation

## 2020-03-12 LAB — CBC
HCT: 45.3 % (ref 39.0–52.0)
Hemoglobin: 14.2 g/dL (ref 13.0–17.0)
MCH: 28.1 pg (ref 26.0–34.0)
MCHC: 31.3 g/dL (ref 30.0–36.0)
MCV: 89.5 fL (ref 80.0–100.0)
Platelets: 167 10*3/uL (ref 150–400)
RBC: 5.06 MIL/uL (ref 4.22–5.81)
RDW: 13.1 % (ref 11.5–15.5)
WBC: 6.3 10*3/uL (ref 4.0–10.5)
nRBC: 0 % (ref 0.0–0.2)

## 2020-03-12 LAB — TYPE AND SCREEN
ABO/RH(D): B POS
Antibody Screen: NEGATIVE

## 2020-03-12 LAB — COMPREHENSIVE METABOLIC PANEL
ALT: 35 U/L (ref 0–44)
AST: 24 U/L (ref 15–41)
Albumin: 3.7 g/dL (ref 3.5–5.0)
Alkaline Phosphatase: 72 U/L (ref 38–126)
Anion gap: 10 (ref 5–15)
BUN: 20 mg/dL (ref 6–20)
CO2: 24 mmol/L (ref 22–32)
Calcium: 9 mg/dL (ref 8.9–10.3)
Chloride: 104 mmol/L (ref 98–111)
Creatinine, Ser: 0.85 mg/dL (ref 0.61–1.24)
GFR calc Af Amer: >60 mL/min (ref >60)
GFR calc non Af Amer: >60 mL/min (ref >60)
Glucose, Bld: 248 mg/dL — ABNORMAL HIGH (ref 70–99)
Potassium: 3.8 mmol/L (ref 3.5–5.1)
Sodium: 138 mmol/L (ref 135–145)
Total Bilirubin: 0.5 mg/dL (ref 0.3–1.2)
Total Protein: 7 g/dL (ref 6.5–8.1)

## 2020-03-12 LAB — GLUCOSE, CAPILLARY: Glucose-Capillary: 257 mg/dL — ABNORMAL HIGH (ref 70–99)

## 2020-03-12 LAB — SARS CORONAVIRUS 2 (TAT 6-24 HRS): SARS Coronavirus 2: NEGATIVE

## 2020-03-12 NOTE — Pre-Procedure Instructions (Signed)
    Lavoy Bernards  03/12/2020     Your procedure is scheduled on Friday, March 14, 2020  Report to [facility] Admitting at 7:00 A.M.  Call this number if you have problems the morning of surgery:  [phone]   Remember: Brush your teeth the morning of surgery with your regular toothpaste.  Do not eat or drink after midnight the night before surgery.    Take these medicines the morning of surgery with A SIP OF WATER : None  Stop taking Aspirin (unless otherwise advised by surgeon), vitamins, fish oil and herbal medications. Do not take any NSAIDs ie: Ibuprofen, Advil, Naproxen (Aleve), Motrin, BC and Goody Powder; stop now.  WHAT DO I DO ABOUT MY DIABETES MEDICATION?  Marland Kitchen Do not take oral diabetes medicines (pills) the morning of surgery such as metFORMIN (GLUCOPHAGE-XR)   Reviewed and Endorsed by [facility] Patient Education Committee, August 2015     Do not wear jewelry.  Do not wear lotions, powders, or perfumes, or deodorant.  Do not shave 48 hours prior to surgery.  Men may shave face and neck.  Do not bring valuables to the hospital.  [facility] is not responsible for any belongings or valuables.  Contacts, dentures or bridgework may not be worn into surgery.  Leave your suitcase in the car.  After surgery it may be brought to your room.  For patients admitted to the hospital, discharge time will be determined by your treatment team.  Patients discharged the day of surgery will not be allowed to drive home.   Special instructions: See " [facility] Preparing For Surgery " sheet.  Please read over the following fact sheets that you were given. Pain Booklet, Coughing and Deep Breathing and Surgical Site Infection Prevention

## 2020-03-12 NOTE — Progress Notes (Signed)
Pre-op appointment completed using [place] Interpreter, Rosann Auerbach. Pt denies SOB, chest pain, and being under the care of a cardiologist. Pt denies having a stress test, echo and cardiac cath. Pt denies recent labs. Pt stated that PCP is Willeen Niece, [place]. Pt reminded to quarantine. Pt verbalized understanding of all pre-op instructions.

## 2020-03-14 ENCOUNTER — Inpatient Hospital Stay (HOSPITAL_COMMUNITY)
Admission: RE | Admit: 2020-03-14 | Discharge: 2020-03-15 | DRG: 027 | Disposition: A | Discharge status: Home / Self Care | Payer: 59 | Attending: Neurosurgery | Admitting: Neurosurgery

## 2020-03-14 ENCOUNTER — Inpatient Hospital Stay (HOSPITAL_COMMUNITY): Payer: 59 | Admitting: Physician Assistant

## 2020-03-14 ENCOUNTER — Encounter (HOSPITAL_COMMUNITY): Payer: Self-pay | Admitting: Neurosurgery

## 2020-03-14 ENCOUNTER — Inpatient Hospital Stay (HOSPITAL_COMMUNITY): Payer: 59 | Admitting: Certified Registered Nurse Anesthetist

## 2020-03-14 ENCOUNTER — Encounter (HOSPITAL_COMMUNITY)
Admission: RE | Disposition: A | Discharge status: Home / Self Care | Payer: Self-pay | Source: Home / Self Care | Attending: Neurosurgery

## 2020-03-14 ENCOUNTER — Other Ambulatory Visit: Payer: Self-pay

## 2020-03-14 DIAGNOSIS — Z87891 Personal history of nicotine dependence: Secondary | ICD-10-CM

## 2020-03-14 DIAGNOSIS — C709 Malignant neoplasm of meninges, unspecified: Secondary | ICD-10-CM | POA: Diagnosis present

## 2020-03-14 DIAGNOSIS — E119 Type 2 diabetes mellitus without complications: Secondary | ICD-10-CM | POA: Diagnosis present

## 2020-03-14 DIAGNOSIS — Z7984 Long term (current) use of oral hypoglycemic drugs: Secondary | ICD-10-CM

## 2020-03-14 DIAGNOSIS — Z8619 Personal history of other infectious and parasitic diseases: Secondary | ICD-10-CM | POA: Diagnosis not present

## 2020-03-14 DIAGNOSIS — D329 Benign neoplasm of meninges, unspecified: Principal | ICD-10-CM | POA: Diagnosis present

## 2020-03-14 DIAGNOSIS — Z20822 Contact with and (suspected) exposure to covid-19: Secondary | ICD-10-CM | POA: Diagnosis present

## 2020-03-14 HISTORY — PX: CRANIOPLASTY: SHX1407

## 2020-03-14 LAB — GLUCOSE, CAPILLARY
Glucose-Capillary: 140 mg/dL — ABNORMAL HIGH (ref 70–99)
Glucose-Capillary: 171 mg/dL — ABNORMAL HIGH (ref 70–99)
Glucose-Capillary: 364 mg/dL — ABNORMAL HIGH (ref 70–99)

## 2020-03-14 SURGERY — CRANIOPLASTY
Anesthesia: General

## 2020-03-14 MED ORDER — METFORMIN HCL ER 500 MG PO TB24
1000.0000 mg | ORAL_TABLET | Freq: Two times a day (BID) | ORAL | Status: DC
  Administered 2020-03-14: 1000 mg via ORAL
  Filled 2020-03-14 (×4): qty 2

## 2020-03-14 MED ORDER — CHLORHEXIDINE GLUCONATE 0.12 % MT SOLN
15.0000 mL | Freq: Once | OROMUCOSAL | Status: AC
  Administered 2020-03-14: 15 mL via OROMUCOSAL
  Filled 2020-03-14: qty 15

## 2020-03-14 MED ORDER — LIDOCAINE 2% (20 MG/ML) 5 ML SYRINGE
INTRAMUSCULAR | Status: DC | PRN
  Administered 2020-03-14: 100 mg via INTRAVENOUS

## 2020-03-14 MED ORDER — THROMBIN 20000 UNITS EX SOLR
CUTANEOUS | Status: DC | PRN
  Administered 2020-03-14: 20 mL via TOPICAL

## 2020-03-14 MED ORDER — SUGAMMADEX SODIUM 200 MG/2ML IV SOLN
INTRAVENOUS | Status: DC | PRN
  Administered 2020-03-14: 200 mg via INTRAVENOUS

## 2020-03-14 MED ORDER — POTASSIUM CHLORIDE IN NACL 20-0.9 MEQ/L-% IV SOLN: INTRAVENOUS | Status: DC

## 2020-03-14 MED ORDER — THROMBIN 20000 UNITS EX SOLR
CUTANEOUS | Status: AC
  Filled 2020-03-14: qty 20000

## 2020-03-14 MED ORDER — PHENYLEPHRINE HCL (PRESSORS) 10 MG/ML IV SOLN
INTRAVENOUS | Status: DC | PRN
  Administered 2020-03-14: 40 ug via INTRAVENOUS

## 2020-03-14 MED ORDER — PROPOFOL 10 MG/ML IV BOLUS
INTRAVENOUS | Status: DC | PRN
  Administered 2020-03-14: 150 mg via INTRAVENOUS

## 2020-03-14 MED ORDER — PHENYLEPHRINE HCL-NACL 10-0.9 MG/250ML-% IV SOLN
INTRAVENOUS | Status: DC | PRN
  Administered 2020-03-14: 15 ug/min via INTRAVENOUS

## 2020-03-14 MED ORDER — SODIUM CHLORIDE 0.9 % IV SOLN: INTRAVENOUS | Status: DC | PRN

## 2020-03-14 MED ORDER — LACTATED RINGERS IV SOLN: INTRAVENOUS | Status: DC

## 2020-03-14 MED ORDER — BACITRACIN ZINC 500 UNIT/GM EX OINT
TOPICAL_OINTMENT | CUTANEOUS | Status: AC
  Filled 2020-03-14: qty 28.35

## 2020-03-14 MED ORDER — LIDOCAINE-EPINEPHRINE 0.5 %-1:200000 IJ SOLN
INTRAMUSCULAR | Status: DC | PRN
  Administered 2020-03-14: 30 mL

## 2020-03-14 MED ORDER — INSULIN ASPART 100 UNIT/ML ~~LOC~~ SOLN
0.0000 [IU] | Freq: Every day | SUBCUTANEOUS | Status: DC
  Administered 2020-03-14: 5 [IU] via SUBCUTANEOUS

## 2020-03-14 MED ORDER — FENTANYL CITRATE (PF) 250 MCG/5ML IJ SOLN
INTRAMUSCULAR | Status: DC | PRN
Start: 2020-03-14 — End: 2020-03-14
  Administered 2020-03-14 (×4): 50 ug via INTRAVENOUS

## 2020-03-14 MED ORDER — ONDANSETRON HCL 4 MG PO TABS: 4.0000 mg | ORAL_TABLET | ORAL | Status: DC | PRN

## 2020-03-14 MED ORDER — MIDAZOLAM HCL 2 MG/2ML IJ SOLN
INTRAMUSCULAR | Status: AC
  Filled 2020-03-14: qty 2

## 2020-03-14 MED ORDER — ACETAMINOPHEN 10 MG/ML IV SOLN
INTRAVENOUS | Status: AC
  Filled 2020-03-14: qty 100

## 2020-03-14 MED ORDER — MORPHINE SULFATE (PF) 2 MG/ML IV SOLN: 1.0000 mg | INTRAVENOUS | Status: DC | PRN

## 2020-03-14 MED ORDER — 0.9 % SODIUM CHLORIDE (POUR BTL) OPTIME
TOPICAL | Status: DC | PRN
  Administered 2020-03-14: 2000 mL

## 2020-03-14 MED ORDER — ROCURONIUM BROMIDE 10 MG/ML (PF) SYRINGE
PREFILLED_SYRINGE | INTRAVENOUS | Status: DC | PRN
  Administered 2020-03-14: 50 mg via INTRAVENOUS

## 2020-03-14 MED ORDER — DEXAMETHASONE SODIUM PHOSPHATE 10 MG/ML IJ SOLN
INTRAMUSCULAR | Status: DC | PRN
  Administered 2020-03-14: 5 mg via INTRAVENOUS

## 2020-03-14 MED ORDER — CHLORHEXIDINE GLUCONATE CLOTH 2 % EX PADS: 6.0000 | MEDICATED_PAD | Freq: Once | CUTANEOUS | Status: DC

## 2020-03-14 MED ORDER — LIDOCAINE-EPINEPHRINE 0.5 %-1:200000 IJ SOLN
INTRAMUSCULAR | Status: AC
  Filled 2020-03-14: qty 1

## 2020-03-14 MED ORDER — BACITRACIN ZINC 500 UNIT/GM EX OINT
TOPICAL_OINTMENT | CUTANEOUS | Status: DC | PRN
  Administered 2020-03-14: 1 via TOPICAL

## 2020-03-14 MED ORDER — CHLORHEXIDINE GLUCONATE 0.12 % MT SOLN: 15.0000 mL | Freq: Once | OROMUCOSAL | Status: DC

## 2020-03-14 MED ORDER — INSULIN ASPART 100 UNIT/ML ~~LOC~~ SOLN
0.0000 [IU] | Freq: Three times a day (TID) | SUBCUTANEOUS | Status: DC
  Administered 2020-03-15: 8 [IU] via SUBCUTANEOUS

## 2020-03-14 MED ORDER — FENTANYL CITRATE (PF) 250 MCG/5ML IJ SOLN
INTRAMUSCULAR | Status: AC
  Filled 2020-03-14: qty 5

## 2020-03-14 MED ORDER — PROPOFOL 10 MG/ML IV BOLUS
INTRAVENOUS | Status: AC
  Filled 2020-03-14: qty 20

## 2020-03-14 MED ORDER — ONDANSETRON HCL 4 MG/2ML IJ SOLN: 4.0000 mg | INTRAMUSCULAR | Status: DC | PRN

## 2020-03-14 MED ORDER — ONDANSETRON HCL 4 MG/2ML IJ SOLN
INTRAMUSCULAR | Status: DC | PRN
  Administered 2020-03-14: 4 mg via INTRAVENOUS

## 2020-03-14 MED ORDER — MIDAZOLAM HCL 2 MG/2ML IJ SOLN
INTRAMUSCULAR | Status: DC | PRN
  Administered 2020-03-14: 1 mg via INTRAVENOUS

## 2020-03-14 MED ORDER — ORAL CARE MOUTH RINSE: 15.0000 mL | Freq: Once | OROMUCOSAL | Status: DC

## 2020-03-14 MED ORDER — ACETAMINOPHEN 10 MG/ML IV SOLN
INTRAVENOUS | Status: DC | PRN
  Administered 2020-03-14: 1000 mg via INTRAVENOUS

## 2020-03-14 MED ORDER — HYDROCODONE-ACETAMINOPHEN 5-325 MG PO TABS
1.0000 | ORAL_TABLET | ORAL | Status: DC | PRN
  Administered 2020-03-15 (×2): 1 via ORAL
  Filled 2020-03-14 (×2): qty 1

## 2020-03-14 MED ORDER — HYDROMORPHONE HCL 1 MG/ML IJ SOLN: 0.2500 mg | INTRAMUSCULAR | Status: DC | PRN

## 2020-03-14 MED ORDER — ORAL CARE MOUTH RINSE: 15.0000 mL | Freq: Once | OROMUCOSAL | Status: AC

## 2020-03-14 MED ORDER — PANTOPRAZOLE SODIUM 40 MG IV SOLR
40.0000 mg | Freq: Every day | INTRAVENOUS | Status: DC
  Administered 2020-03-14: 40 mg via INTRAVENOUS
  Filled 2020-03-14: qty 40

## 2020-03-14 MED ORDER — CEFAZOLIN SODIUM-DEXTROSE 2-4 GM/100ML-% IV SOLN
2.0000 g | INTRAVENOUS | Status: AC
  Administered 2020-03-14: 2 g via INTRAVENOUS
  Filled 2020-03-14: qty 100

## 2020-03-14 MED ORDER — SODIUM CHLORIDE 0.9 % IV SOLN: INTRAVENOUS | Status: DC

## 2020-03-14 MED ORDER — NALOXONE HCL 0.4 MG/ML IJ SOLN: 0.0800 mg | INTRAMUSCULAR | Status: DC | PRN

## 2020-03-14 SURGICAL SUPPLY — 56 items
BLADE CLIPPER SURG (BLADE) IMPLANT
BNDG STRETCH 4X75 STRL LF (GAUZE/BANDAGES/DRESSINGS) IMPLANT
CANISTER SUCT 3000ML PPV (MISCELLANEOUS) ×3 IMPLANT
CARTRIDGE OIL MAESTRO DRILL (MISCELLANEOUS) IMPLANT
CLIP RANEY DISP (INSTRUMENTS) IMPLANT
COVER BACK TABLE 60X90IN (DRAPES) IMPLANT
COVER WAND RF STERILE (DRAPES) IMPLANT
DECANTER SPIKE VIAL GLASS SM (MISCELLANEOUS) ×3 IMPLANT
DERMABOND ADVANCED (GAUZE/BANDAGES/DRESSINGS)
DERMABOND ADVANCED .7 DNX12 (GAUZE/BANDAGES/DRESSINGS) IMPLANT
DIFFUSER DRILL AIR PNEUMATIC (MISCELLANEOUS) IMPLANT
DRAPE NEUROLOGICAL W/INCISE (DRAPES) ×3 IMPLANT
DRAPE WARM FLUID 44X44 (DRAPES) ×3 IMPLANT
DRSG TELFA 3X8 NADH (GAUZE/BANDAGES/DRESSINGS) ×3 IMPLANT
DURAPREP 6ML APPLICATOR 50/CS (WOUND CARE) ×3 IMPLANT
ELECT REM PT RETURN 9FT ADLT (ELECTROSURGICAL) ×3
ELECTRODE REM PT RTRN 9FT ADLT (ELECTROSURGICAL) ×1 IMPLANT
EVACUATOR 1/8 PVC DRAIN (DRAIN) ×3 IMPLANT
GAUZE 4X4 16PLY RFD (DISPOSABLE) IMPLANT
GAUZE SPONGE 4X4 12PLY STRL (GAUZE/BANDAGES/DRESSINGS) ×3 IMPLANT
GLOVE ECLIPSE 6.5 STRL STRAW (GLOVE) ×3 IMPLANT
GLOVE EXAM NITRILE XL STR (GLOVE) IMPLANT
GOWN STRL REUS W/ TWL LRG LVL3 (GOWN DISPOSABLE) ×2 IMPLANT
GOWN STRL REUS W/ TWL XL LVL3 (GOWN DISPOSABLE) IMPLANT
GOWN STRL REUS W/TWL 2XL LVL3 (GOWN DISPOSABLE) IMPLANT
GOWN STRL REUS W/TWL LRG LVL3 (GOWN DISPOSABLE) ×4
GOWN STRL REUS W/TWL XL LVL3 (GOWN DISPOSABLE)
HEMOSTAT SURGICEL 2X14 (HEMOSTASIS) IMPLANT
IMPL PEEK CUSTOM CRANIAL (Neurostimulator) ×1 IMPLANT
IMPLANT PEEK CUSTOM CRANIAL (Neurostimulator) ×3 IMPLANT
KIT BASIN OR (CUSTOM PROCEDURE TRAY) ×3 IMPLANT
KIT TURNOVER KIT B (KITS) ×3 IMPLANT
NEEDLE HYPO 25X1 1.5 SAFETY (NEEDLE) ×3 IMPLANT
NS IRRIG 1000ML POUR BTL (IV SOLUTION) ×6 IMPLANT
OIL CARTRIDGE MAESTRO DRILL (MISCELLANEOUS)
PACK BATTERY CMF DISP FOR DVR (ORTHOPEDIC DISPOSABLE SUPPLIES) ×3 IMPLANT
PACK CRANIOTOMY CUSTOM (CUSTOM PROCEDURE TRAY) ×3 IMPLANT
PAD ARMBOARD 7.5X6 YLW CONV (MISCELLANEOUS) ×9 IMPLANT
PIN MAYFIELD SKULL DISP (PIN) IMPLANT
PLATE BONE 12 2H TARGET XL (Plate) ×3 IMPLANT
PLATE CRANIAL CMF UNIV (Plate) ×12 IMPLANT
SCREW UNIII AXS SD 1.5X4 (Screw) ×45 IMPLANT
SET CRAINOPLASTY (SET/KITS/TRAYS/PACK) ×3 IMPLANT
SPONGE SURGIFOAM ABS GEL 100 (HEMOSTASIS) IMPLANT
STAPLER SKIN PROX WIDE 3.9 (STAPLE) ×3 IMPLANT
STAPLER VISISTAT 35W (STAPLE) ×3 IMPLANT
SUT ETHILON 2 0 PSLX (SUTURE) ×6 IMPLANT
SUT ETHILON 3 0 FSL (SUTURE) ×3 IMPLANT
SUT NURALON 4 0 TR CR/8 (SUTURE) IMPLANT
SUT VIC AB 2-0 CT2 18 VCP726D (SUTURE) ×3 IMPLANT
SYR BULB IRRIG 60ML STRL (SYRINGE) ×6 IMPLANT
TOWEL GREEN STERILE (TOWEL DISPOSABLE) ×3 IMPLANT
TOWEL GREEN STERILE FF (TOWEL DISPOSABLE) ×3 IMPLANT
TRAY FOLEY MTR SLVR 16FR STAT (SET/KITS/TRAYS/PACK) ×3 IMPLANT
UNDERPAD 30X36 HEAVY ABSORB (UNDERPADS AND DIAPERS) IMPLANT
WATER STERILE IRR 1000ML POUR (IV SOLUTION) ×3 IMPLANT

## 2020-03-14 NOTE — H&P (Signed)
BP 123/86   Pulse 68   Temp 98.2 F (36.8 C) (Oral)   Resp 18   Ht 5\' 5"  (1.651 m)   Wt 88.9 kg   SpO2 97%   BMI 32.61 kg/m  Harold Torres presents for cranioplasty status post meningioma resection.  No Known Allergies Past Medical History:  Diagnosis Date  . Diabetes mellitus without complication ([place])   . Generalized skin cysts    back x 2, left leg, hand  . Hepatitis    Hepatitis B  . Meningioma [facility])    Past Surgical History:  Procedure Laterality Date  . CRANIOTOMY Bilateral 01/31/2020   Procedure: OCCIPITAL CRANIOTOMY FOR MASS;  Surgeon: Ashok Pall, MD;  Location: [place];  Service: Neurosurgery;  Laterality: Bilateral;  . CRANIOTOMY N/A 02/08/2020   Procedure: CRANIECTOMY FOR TUMOR;  Surgeon: Ashok Pall, MD;  Location: [place];  Service: Neurosurgery;  Laterality: N/A;  CRANIECTOMY FOR TUMOR   History reviewed. No pertinent family history. Social History   Socioeconomic History  . Marital status: Married    Spouse name: Not on file  . Number of children: Not on file  . Years of education: Not on file  . Highest education level: Not on file  Occupational History  . Not on file  Tobacco Use  . Smoking status: Former Smoker    Types: Cigarettes  . Smokeless tobacco: Never Used  . Tobacco comment: at age [age]  Vaping Use  . Vaping Use: Never used  Substance and Sexual Activity  . Alcohol use: Never  . Drug use: Never  . Sexual activity: Yes    Birth control/protection: Condom  Other Topics Concern  . Not on file  Social History Narrative  . Not on file   Social Determinants of Health   Financial Resource Strain:   . Difficulty of Paying Living Expenses: Not on file  Food Insecurity:   . Worried About Charity fundraiser in the Last Year: Not on file  . Ran Out of Food in the Last Year: Not on file  Transportation Needs:   . Lack of Transportation (Medical): Not on file  . Lack of Transportation (Non-Medical): Not on file  Physical Activity:   . Days  of Exercise per Week: Not on file  . Minutes of Exercise per Session: Not on file  Stress:   . Feeling of Stress : Not on file  Social Connections:   . Frequency of Communication with Friends and Family: Not on file  . Frequency of Social Gatherings with Friends and Family: Not on file  . Attends Religious Services: Not on file  . Active Member of Clubs or Organizations: Not on file  . Attends Archivist Meetings: Not on file  . Marital Status: Not on file  Intimate Partner Violence:   . Fear of Current or Ex-Partner: Not on file  . Emotionally Abused: Not on file  . Physically Abused: Not on file  . Sexually Abused: Not on file   Prior to Admission medications   Medication Sig Start Date End Date Taking? Authorizing Provider  metFORMIN (GLUCOPHAGE-XR) 500 MG 24 hr tablet Take 1,500 mg by mouth in the morning and at bedtime.  11/08/19  Yes [provider]  HYDROcodone-acetaminophen (NORCO/VICODIN) 5-325 MG tablet Take 1 tablet by mouth every 4 (four) hours as needed for moderate pain. Patient not taking: Reported on 03/07/2020 02/11/20   Ashok Pall, MD   Physical Exam Constitutional:      Appearance: Normal  appearance. He is normal weight.  HENT:     Head:     Jaw: There is normal jaw occlusion.     Comments: Craniectomy defect    Nose: Nose normal.     Mouth/Throat:     Mouth: Mucous membranes are moist.     Pharynx: Oropharynx is clear.  Eyes:     Extraocular Movements: Extraocular movements intact.     Conjunctiva/sclera: Conjunctivae normal.     Pupils: Pupils are equal, round, and reactive to light.  Cardiovascular:     Rate and Rhythm: Normal rate and regular rhythm.     Pulses: Normal pulses.     Heart sounds: Normal heart sounds.  Pulmonary:     Effort: Pulmonary effort is normal.  Abdominal:     General: Abdomen is flat. Bowel sounds are normal.     Palpations: Abdomen is soft.  Musculoskeletal:        General: Normal range of motion.      Cervical back: Normal range of motion and neck supple.  Skin:    General: Skin is warm and dry.  Neurological:     General: No focal deficit present.     Mental Status: He is alert and oriented to person, place, and time.     Cranial Nerves: No cranial nerve deficit.     Motor: No weakness.     Gait: Gait normal.  Psychiatric:        Mood and Affect: Mood normal.        Behavior: Behavior normal.        Thought Content: Thought content normal.        Judgment: Judgment normal.   admit for cranioplasty

## 2020-03-14 NOTE — Anesthesia Procedure Notes (Signed)
Procedure Name: Intubation Date/Time: 03/14/2020 9:30 AM Performed by: Clearnce Sorrel, CRNA Pre-anesthesia Checklist: Patient identified, Suction available, Emergency Drugs available, Patient being monitored and Timeout performed Patient Re-evaluated:Patient Re-evaluated prior to induction Oxygen Delivery Method: Circle system utilized Preoxygenation: Pre-oxygenation with 100% oxygen Induction Type: IV induction Ventilation: Mask ventilation without difficulty Laryngoscope Size: Mac and 3 Grade View: Grade I Tube type: Oral Tube size: 7.5 mm Number of attempts: 1 Airway Equipment and Method: Stylet Placement Confirmation: ETT inserted through vocal cords under direct vision,  positive ETCO2 and breath sounds checked- equal and bilateral Secured at: 22 cm Tube secured with: Tape Dental Injury: Teeth and Oropharynx as per pre-operative assessment

## 2020-03-14 NOTE — Transfer of Care (Signed)
Immediate Anesthesia Transfer of Care Note  Patient: Harold Torres  Procedure(s) Performed: CRANIOPLASTY (N/A )  Patient Location: PACU  Anesthesia Type:General  Level of Consciousness: sedated  Airway & Oxygen Therapy: Patient Spontanous Breathing and Patient connected to face mask oxygen  Post-op Assessment: Report given to RN and Post -op Vital signs reviewed and stable  Post vital signs: Reviewed and stable  Last Vitals:  Vitals Value Taken Time  BP 112/71 03/14/20 1123  Temp    Pulse 81 03/14/20 1123  Resp 15 03/14/20 1123  SpO2 100 % 03/14/20 1123  Vitals shown include unvalidated device data.  Last Pain:  Vitals:   03/14/20 0739  TempSrc:   PainSc: 0-No pain      Patients Stated Pain Goal: 3 (52/77/82 4235)  Complications: No complications documented.

## 2020-03-14 NOTE — Anesthesia Postprocedure Evaluation (Signed)
Anesthesia Post Note  Patient: Harold Torres  Procedure(s) Performed: CRANIOPLASTY (N/A )     Patient location during evaluation: PACU Anesthesia Type: General Level of consciousness: awake Pain management: pain level controlled Vital Signs Assessment: post-procedure vital signs reviewed and stable Respiratory status: spontaneous breathing Cardiovascular status: stable Postop Assessment: no apparent nausea or vomiting Anesthetic complications: no   No complications documented.  Last Vitals:  Vitals:   03/14/20 1344 03/14/20 1537  BP: 103/76 104/68  Pulse: 76 80  Resp: 18 16  Temp: (!) 36.3 C 36.8 C  SpO2: 95% 96%    Last Pain:  Vitals:   03/14/20 1537  TempSrc: Oral  PainSc:                  Cecile Gillispie

## 2020-03-14 NOTE — Op Note (Signed)
03/14/2020  5:45 PM  PATIENT:  Harold Torres  44 y.o. male Who presents for a cranioplasty with a custom peek implant PRE-OPERATIVE DIAGNOSIS:  Meningioma  POST-OPERATIVE DIAGNOSIS:  Meningioma  PROCEDURE:  Procedure(s): CRANIOPLASTY  SURGEON: Surgeon(s): Ashok Pall, MD Dawley, Theodoro Doing, DO  ASSISTANTS:Dawley  ANESTHESIA:   general  EBL:  Total I/O In: 776.2 [P.O.:120; I.V.:506.2; Other:50; IV Piggyback:100] Out: 8316 [Urine:625; Drains:50; Blood:400]  BLOOD ADMINISTERED:none  CELL SAVER GIVEN:none  COUNT:per nursing  DRAINS: (medium) Jackson-Pratt drain(s) with closed bulb suction in the subgaleal space   SPECIMEN:  No Specimen  DICTATION: Rubin Dais was taken to the operating room, intubated, and placed under a general anesthetic without difficulty. He was positioned prone with his head on a horseshoe headrest. His head was shaved, prepped and draped in a sterile manner.  I opened the old incision and dissected the scalp from the skull and dura until I had full exposure. I placed the and secured the cranioplasty graft using plates and screws along the periphery. We placed a subgaleal drain and tunneled it out through the scalp. The wound was closed with a nylon running suture. A sterile dressing was applied PLAN OF CARE: Admit for overnight observation  PATIENT DISPOSITION:  PACU - hemodynamically stable.   Delay start of Pharmacological VTE agent (>24hrs) due to surgical blood loss or risk of bleeding:  yes

## 2020-03-14 NOTE — Anesthesia Preprocedure Evaluation (Addendum)
Anesthesia Evaluation  Patient identified by MRN, date of birth, ID band Patient awake    Airway Mallampati: II  TM Distance: >3 FB     Dental   Pulmonary former smoker,    breath sounds clear to auscultation       Cardiovascular  Rhythm:Regular Rate:Normal     Neuro/Psych History noted CG    GI/Hepatic negative GI ROS, (+) Hepatitis -  Endo/Other  diabetes  Renal/GU      Musculoskeletal   Abdominal   Peds  Hematology   Anesthesia Other Findings   Reproductive/Obstetrics                             Anesthesia Physical Anesthesia Plan  ASA: III  Anesthesia Plan: General   Post-op Pain Management:    Induction: Intravenous  PONV Risk Score and Plan: 3 and Ondansetron, Dexamethasone and Midazolam  Airway Management Planned: Oral ETT  Additional Equipment:   Intra-op Plan:   Post-operative Plan: Extubation in OR  Informed Consent: I have reviewed the patients History and Physical, chart, labs and discussed the procedure including the risks, benefits and alternatives for the proposed anesthesia with the patient or authorized representative who has indicated his/her understanding and acceptance.     Dental advisory given  Plan Discussed with: CRNA and Anesthesiologist  Anesthesia Plan Comments:         Anesthesia Quick Evaluation

## 2020-03-15 LAB — GLUCOSE, CAPILLARY: Glucose-Capillary: 269 mg/dL — ABNORMAL HIGH (ref 70–99)

## 2020-03-15 MED ORDER — HYDROCODONE-ACETAMINOPHEN 5-325 MG PO TABS: 1.0000 | ORAL_TABLET | Freq: Four times a day (QID) | ORAL | 0 refills | Status: DC | PRN

## 2020-03-15 NOTE — Discharge Summary (Signed)
Physician Discharge Summary  Patient ID: Harold Torres MRN: [id number] DOB/AGE: [birth year] 42 y.o.  Admit date: 03/14/2020 Discharge date: 03/15/2020  Admission Diagnoses: Cranial defect due to meningoma.   Discharge Diagnoses: same Active Problems:   Meningioma [facility])   Discharged Condition: good  Hospital Course: Harold Torres was admitted and underwent a cranioplasty yesterday with out complication. Post op he is voiding, ambulating and tolerating a regular diet. His wound is clean, dry, and without signs of infection.   Treatments: surgery: Cranioplasty.  Discharge Exam: Blood pressure 110/74, pulse 80, temperature 98.3 F (36.8 C), temperature source Oral, resp. rate 20, height 5\' 5"  (1.651 m), weight 88.9 kg, SpO2 95 %. General appearance: alert, cooperative, appears stated age and no distress  Disposition: Discharge disposition: 01-Home or Self Care      Meningioma  Allergies as of 03/15/2020   No Known Allergies     Medication List    TAKE these medications   HYDROcodone-acetaminophen 5-325 MG tablet Commonly known as: NORCO/VICODIN Take 1 tablet by mouth every 6 (six) hours as needed for moderate pain. What changed: when to take this   metFORMIN 500 MG 24 hr tablet Commonly known as: GLUCOPHAGE-XR Take 1,500 mg by mouth in the morning and at bedtime.       Follow-up Information    Ashok Pall, MD Follow up in 2 week(s).   Specialty: Neurosurgery Why: please call to make an appointment for suture removal Contact information: [address] [place] [address] [phone]               Signed: Ashok Pall 03/15/2020, 9:31 AM

## 2020-03-15 NOTE — Progress Notes (Signed)
Patient is discharged from room 3C08 at this time. Alert and in stable condition. IV site d/c'd and instructions read to patient with understanding verbalized and all questions answered. Ambulate out of unit with all belongings at side.

## 2020-03-17 ENCOUNTER — Encounter (HOSPITAL_COMMUNITY): Payer: Self-pay | Admitting: Neurosurgery

## 2020-03-21 ENCOUNTER — Other Ambulatory Visit: Payer: Self-pay | Admitting: Neurosurgery

## 2020-03-21 ENCOUNTER — Inpatient Hospital Stay (HOSPITAL_COMMUNITY): Payer: 59 | Admitting: Anesthesiology

## 2020-03-21 ENCOUNTER — Encounter (HOSPITAL_COMMUNITY)
Admission: AD | Disposition: A | Discharge status: Home / Self Care | Payer: Self-pay | Source: Ambulatory Visit | Attending: Neurosurgery

## 2020-03-21 ENCOUNTER — Inpatient Hospital Stay (HOSPITAL_COMMUNITY)
Admission: AD | Admit: 2020-03-21 | Discharge: 2020-04-08 | DRG: 857 | Disposition: A | Discharge status: Home / Self Care | Payer: 59 | Source: Ambulatory Visit | Attending: Neurosurgery | Admitting: Neurosurgery

## 2020-03-21 ENCOUNTER — Encounter (HOSPITAL_COMMUNITY): Payer: Self-pay | Admitting: Neurosurgery

## 2020-03-21 ENCOUNTER — Other Ambulatory Visit: Payer: Self-pay

## 2020-03-21 DIAGNOSIS — T8141XA Infection following a procedure, superficial incisional surgical site, initial encounter: Principal | ICD-10-CM | POA: Diagnosis present

## 2020-03-21 DIAGNOSIS — Z79899 Other long term (current) drug therapy: Secondary | ICD-10-CM

## 2020-03-21 DIAGNOSIS — D320 Benign neoplasm of cerebral meninges: Secondary | ICD-10-CM | POA: Diagnosis present

## 2020-03-21 DIAGNOSIS — Z86011 Personal history of benign neoplasm of the brain: Secondary | ICD-10-CM | POA: Diagnosis not present

## 2020-03-21 DIAGNOSIS — D329 Benign neoplasm of meninges, unspecified: Secondary | ICD-10-CM | POA: Diagnosis present

## 2020-03-21 DIAGNOSIS — Z87891 Personal history of nicotine dependence: Secondary | ICD-10-CM

## 2020-03-21 DIAGNOSIS — E119 Type 2 diabetes mellitus without complications: Secondary | ICD-10-CM | POA: Diagnosis present

## 2020-03-21 DIAGNOSIS — B961 Klebsiella pneumoniae [K. pneumoniae] as the cause of diseases classified elsewhere: Secondary | ICD-10-CM | POA: Diagnosis present

## 2020-03-21 DIAGNOSIS — B1910 Unspecified viral hepatitis B without hepatic coma: Secondary | ICD-10-CM | POA: Diagnosis not present

## 2020-03-21 DIAGNOSIS — Z20822 Contact with and (suspected) exposure to covid-19: Secondary | ICD-10-CM | POA: Diagnosis present

## 2020-03-21 DIAGNOSIS — Y848 Other medical procedures as the cause of abnormal reaction of the patient, or of later complication, without mention of misadventure at the time of the procedure: Secondary | ICD-10-CM | POA: Diagnosis present

## 2020-03-21 DIAGNOSIS — B181 Chronic viral hepatitis B without delta-agent: Secondary | ICD-10-CM | POA: Diagnosis present

## 2020-03-21 DIAGNOSIS — M952 Other acquired deformity of head: Secondary | ICD-10-CM | POA: Diagnosis present

## 2020-03-21 DIAGNOSIS — T8149XA Infection following a procedure, other surgical site, initial encounter: Secondary | ICD-10-CM | POA: Diagnosis not present

## 2020-03-21 HISTORY — PX: CRANIOPLASTY: SHX1407

## 2020-03-21 LAB — COMPREHENSIVE METABOLIC PANEL
ALT: 31 U/L (ref 0–44)
AST: 19 U/L (ref 15–41)
Albumin: 3.3 g/dL — ABNORMAL LOW (ref 3.5–5.0)
Alkaline Phosphatase: 76 U/L (ref 38–126)
Anion gap: 9 (ref 5–15)
BUN: 14 mg/dL (ref 6–20)
CO2: 26 mmol/L (ref 22–32)
Calcium: 8.9 mg/dL (ref 8.9–10.3)
Chloride: 105 mmol/L (ref 98–111)
Creatinine, Ser: 0.76 mg/dL (ref 0.61–1.24)
GFR calc Af Amer: >60 mL/min (ref >60)
GFR calc non Af Amer: >60 mL/min (ref >60)
Glucose, Bld: 249 mg/dL — ABNORMAL HIGH (ref 70–99)
Potassium: 4.2 mmol/L (ref 3.5–5.1)
Sodium: 140 mmol/L (ref 135–145)
Total Bilirubin: 0.3 mg/dL (ref 0.3–1.2)
Total Protein: 6.7 g/dL (ref 6.5–8.1)

## 2020-03-21 LAB — CBC
HCT: 37.2 % — ABNORMAL LOW (ref 39.0–52.0)
Hemoglobin: 11.9 g/dL — ABNORMAL LOW (ref 13.0–17.0)
MCH: 27.4 pg (ref 26.0–34.0)
MCHC: 32 g/dL (ref 30.0–36.0)
MCV: 85.7 fL (ref 80.0–100.0)
Platelets: 241 10*3/uL (ref 150–400)
RBC: 4.34 MIL/uL (ref 4.22–5.81)
RDW: 12.9 % (ref 11.5–15.5)
WBC: 8.5 10*3/uL (ref 4.0–10.5)
nRBC: 0 % (ref 0.0–0.2)

## 2020-03-21 LAB — GLUCOSE, CAPILLARY
Glucose-Capillary: 268 mg/dL — ABNORMAL HIGH (ref 70–99)
Glucose-Capillary: 186 mg/dL — ABNORMAL HIGH (ref 70–99)
Glucose-Capillary: 161 mg/dL — ABNORMAL HIGH (ref 70–99)

## 2020-03-21 LAB — SARS CORONAVIRUS 2 BY RT PCR (HOSPITAL ORDER, PERFORMED IN ~~LOC~~ HOSPITAL LAB): SARS Coronavirus 2: NEGATIVE

## 2020-03-21 SURGERY — CRANIOPLASTY
Anesthesia: General | Site: Head

## 2020-03-21 MED ORDER — HYDROMORPHONE HCL 1 MG/ML IJ SOLN: 0.2500 mg | INTRAMUSCULAR | Status: DC | PRN

## 2020-03-21 MED ORDER — DEXAMETHASONE SODIUM PHOSPHATE 10 MG/ML IJ SOLN
INTRAMUSCULAR | Status: DC | PRN
  Administered 2020-03-21: 5 mg via INTRAVENOUS

## 2020-03-21 MED ORDER — 0.9 % SODIUM CHLORIDE (POUR BTL) OPTIME
TOPICAL | Status: DC | PRN
  Administered 2020-03-21 (×3): 1000 mL

## 2020-03-21 MED ORDER — ACETAMINOPHEN 650 MG RE SUPP: 650.0000 mg | RECTAL | Status: DC | PRN

## 2020-03-21 MED ORDER — PROPOFOL 10 MG/ML IV BOLUS
INTRAVENOUS | Status: AC
  Filled 2020-03-21: qty 20

## 2020-03-21 MED ORDER — FENTANYL CITRATE (PF) 100 MCG/2ML IJ SOLN
INTRAMUSCULAR | Status: DC | PRN
  Administered 2020-03-21: 100 ug via INTRAVENOUS
  Administered 2020-03-21: 50 ug via INTRAVENOUS

## 2020-03-21 MED ORDER — MAGNESIUM CITRATE PO SOLN
1.0000 | Freq: Once | ORAL | Status: DC | PRN
  Filled 2020-03-21: qty 296

## 2020-03-21 MED ORDER — ONDANSETRON HCL 4 MG/2ML IJ SOLN: 4.0000 mg | INTRAMUSCULAR | Status: DC | PRN

## 2020-03-21 MED ORDER — OXYCODONE HCL 5 MG PO TABS: 5.0000 mg | ORAL_TABLET | Freq: Once | ORAL | Status: DC | PRN

## 2020-03-21 MED ORDER — VANCOMYCIN HCL IN DEXTROSE 1-5 GM/200ML-% IV SOLN
INTRAVENOUS | Status: AC
  Filled 2020-03-21: qty 200

## 2020-03-21 MED ORDER — THROMBIN 20000 UNITS EX SOLR: CUTANEOUS | Status: DC | PRN

## 2020-03-21 MED ORDER — DOCUSATE SODIUM 100 MG PO CAPS
100.0000 mg | ORAL_CAPSULE | Freq: Two times a day (BID) | ORAL | Status: DC
  Administered 2020-03-21 – 2020-04-06 (×23): 100 mg via ORAL
  Filled 2020-03-21 (×28): qty 1

## 2020-03-21 MED ORDER — ACETAMINOPHEN 10 MG/ML IV SOLN: 1000.0000 mg | Freq: Once | INTRAVENOUS | Status: DC | PRN

## 2020-03-21 MED ORDER — HYDROCODONE-ACETAMINOPHEN 5-325 MG PO TABS
1.0000 | ORAL_TABLET | ORAL | Status: DC | PRN
  Administered 2020-03-24 – 2020-04-05 (×2): 1 via ORAL
  Filled 2020-03-21 (×2): qty 1

## 2020-03-21 MED ORDER — MIDAZOLAM HCL 2 MG/2ML IJ SOLN
INTRAMUSCULAR | Status: AC
  Filled 2020-03-21: qty 2

## 2020-03-21 MED ORDER — BACITRACIN ZINC 500 UNIT/GM EX OINT
TOPICAL_OINTMENT | CUTANEOUS | Status: AC
  Filled 2020-03-21: qty 28.35

## 2020-03-21 MED ORDER — MEPERIDINE HCL 25 MG/ML IJ SOLN: 6.2500 mg | INTRAMUSCULAR | Status: DC | PRN

## 2020-03-21 MED ORDER — SODIUM CHLORIDE 0.9 % IV SOLN: INTRAVENOUS | Status: DC

## 2020-03-21 MED ORDER — ONDANSETRON HCL 4 MG/2ML IJ SOLN
INTRAMUSCULAR | Status: DC | PRN
  Administered 2020-03-21: 4 mg via INTRAVENOUS

## 2020-03-21 MED ORDER — VANCOMYCIN HCL 1000 MG IV SOLR
INTRAVENOUS | Status: DC | PRN
  Administered 2020-03-21: 1000 mg via INTRAVENOUS

## 2020-03-21 MED ORDER — PHENYLEPHRINE HCL (PRESSORS) 10 MG/ML IV SOLN
INTRAVENOUS | Status: DC | PRN
  Administered 2020-03-21 (×3): 80 ug via INTRAVENOUS

## 2020-03-21 MED ORDER — LIDOCAINE HCL (CARDIAC) PF 100 MG/5ML IV SOSY
PREFILLED_SYRINGE | INTRAVENOUS | Status: DC | PRN
  Administered 2020-03-21: 100 mg via INTRAVENOUS

## 2020-03-21 MED ORDER — ONDANSETRON HCL 4 MG/2ML IJ SOLN: 4.0000 mg | Freq: Once | INTRAMUSCULAR | Status: DC | PRN

## 2020-03-21 MED ORDER — METFORMIN HCL ER 750 MG PO TB24
1500.0000 mg | ORAL_TABLET | Freq: Two times a day (BID) | ORAL | Status: DC
  Administered 2020-03-22: 1500 mg via ORAL
  Filled 2020-03-21 (×2): qty 2

## 2020-03-21 MED ORDER — ORAL CARE MOUTH RINSE
15.0000 mL | Freq: Once | OROMUCOSAL | Status: AC
  Administered 2020-03-21: 15 mL via OROMUCOSAL

## 2020-03-21 MED ORDER — SENNOSIDES-DOCUSATE SODIUM 8.6-50 MG PO TABS
1.0000 | ORAL_TABLET | Freq: Every evening | ORAL | Status: DC | PRN
  Filled 2020-03-21: qty 1

## 2020-03-21 MED ORDER — NALOXONE HCL 0.4 MG/ML IJ SOLN: 0.0800 mg | INTRAMUSCULAR | Status: DC | PRN

## 2020-03-21 MED ORDER — BISACODYL 5 MG PO TBEC
5.0000 mg | DELAYED_RELEASE_TABLET | Freq: Every day | ORAL | Status: DC | PRN
  Filled 2020-03-21: qty 1

## 2020-03-21 MED ORDER — MIDAZOLAM HCL 5 MG/5ML IJ SOLN
INTRAMUSCULAR | Status: DC | PRN
  Administered 2020-03-21: 2 mg via INTRAVENOUS

## 2020-03-21 MED ORDER — ACETAMINOPHEN 325 MG PO TABS: 650.0000 mg | ORAL_TABLET | ORAL | Status: DC | PRN

## 2020-03-21 MED ORDER — SODIUM CHLORIDE 0.9 % IV SOLN
2.0000 g | Freq: Three times a day (TID) | INTRAVENOUS | Status: DC
  Administered 2020-03-21 – 2020-03-24 (×9): 2 g via INTRAVENOUS
  Filled 2020-03-21 (×9): qty 2

## 2020-03-21 MED ORDER — CHLORHEXIDINE GLUCONATE 0.12 % MT SOLN: 15.0000 mL | Freq: Once | OROMUCOSAL | Status: AC

## 2020-03-21 MED ORDER — ONDANSETRON HCL 4 MG PO TABS
4.0000 mg | ORAL_TABLET | ORAL | Status: DC | PRN
  Filled 2020-03-21: qty 1

## 2020-03-21 MED ORDER — MORPHINE SULFATE (PF) 2 MG/ML IV SOLN: 1.0000 mg | INTRAVENOUS | Status: DC | PRN

## 2020-03-21 MED ORDER — OXYCODONE HCL 5 MG/5ML PO SOLN: 5.0000 mg | Freq: Once | ORAL | Status: DC | PRN

## 2020-03-21 MED ORDER — PROPOFOL 10 MG/ML IV BOLUS
INTRAVENOUS | Status: DC | PRN
  Administered 2020-03-21: 120 mg via INTRAVENOUS

## 2020-03-21 MED ORDER — SUGAMMADEX SODIUM 200 MG/2ML IV SOLN
INTRAVENOUS | Status: DC | PRN
  Administered 2020-03-21: 300 mg via INTRAVENOUS

## 2020-03-21 MED ORDER — HEPARIN SODIUM (PORCINE) 5000 UNIT/ML IJ SOLN
5000.0000 [IU] | Freq: Three times a day (TID) | INTRAMUSCULAR | Status: DC
  Administered 2020-03-21 – 2020-04-08 (×51): 5000 [IU] via SUBCUTANEOUS
  Filled 2020-03-21 (×53): qty 1

## 2020-03-21 MED ORDER — THROMBIN 20000 UNITS EX SOLR
CUTANEOUS | Status: AC
  Filled 2020-03-21: qty 20000

## 2020-03-21 MED ORDER — ROCURONIUM BROMIDE 100 MG/10ML IV SOLN
INTRAVENOUS | Status: DC | PRN
  Administered 2020-03-21: 20 mg via INTRAVENOUS
  Administered 2020-03-21: 50 mg via INTRAVENOUS

## 2020-03-21 MED ORDER — LACTATED RINGERS IV SOLN: INTRAVENOUS | Status: DC | PRN

## 2020-03-21 MED ORDER — VANCOMYCIN HCL 1500 MG/300ML IV SOLN
1500.0000 mg | Freq: Two times a day (BID) | INTRAVENOUS | Status: DC
  Administered 2020-03-22 – 2020-03-24 (×6): 1500 mg via INTRAVENOUS
  Filled 2020-03-21 (×6): qty 300

## 2020-03-21 MED ORDER — LIDOCAINE-EPINEPHRINE 0.5 %-1:200000 IJ SOLN
INTRAMUSCULAR | Status: AC
  Filled 2020-03-21: qty 1

## 2020-03-21 MED ORDER — FENTANYL CITRATE (PF) 250 MCG/5ML IJ SOLN
INTRAMUSCULAR | Status: AC
  Filled 2020-03-21: qty 5

## 2020-03-21 MED ORDER — POTASSIUM CHLORIDE IN NACL 20-0.9 MEQ/L-% IV SOLN
INTRAVENOUS | Status: DC
  Filled 2020-03-21 (×21): qty 1000

## 2020-03-21 SURGICAL SUPPLY — 54 items
ADH SKN CLS APL DERMABOND .7 (GAUZE/BANDAGES/DRESSINGS)
BLADE CLIPPER SURG (BLADE) IMPLANT
BNDG CMPR 75X41 PLY HI ABS (GAUZE/BANDAGES/DRESSINGS)
BNDG STRETCH 4X75 STRL LF (GAUZE/BANDAGES/DRESSINGS) IMPLANT
CANISTER SUCT 3000ML PPV (MISCELLANEOUS) ×6 IMPLANT
CARTRIDGE OIL MAESTRO DRILL (MISCELLANEOUS) IMPLANT
CLIP RANEY DISP (INSTRUMENTS) IMPLANT
COVER BACK TABLE 60X90IN (DRAPES) IMPLANT
COVER WAND RF STERILE (DRAPES) IMPLANT
DECANTER SPIKE VIAL GLASS SM (MISCELLANEOUS) IMPLANT
DERMABOND ADVANCED (GAUZE/BANDAGES/DRESSINGS)
DERMABOND ADVANCED .7 DNX12 (GAUZE/BANDAGES/DRESSINGS) IMPLANT
DIFFUSER DRILL AIR PNEUMATIC (MISCELLANEOUS) IMPLANT
DRAPE NEUROLOGICAL W/INCISE (DRAPES) IMPLANT
DRAPE WARM FLUID 44X44 (DRAPES) ×3 IMPLANT
DRSG PAD ABDOMINAL 8X10 ST (GAUZE/BANDAGES/DRESSINGS) ×3 IMPLANT
DURAGUARD 06CMX08CM ×3 IMPLANT
DURAPREP 26ML APPLICATOR (WOUND CARE) ×3 IMPLANT
DURAPREP 6ML APPLICATOR 50/CS (WOUND CARE) ×3 IMPLANT
ELECT REM PT RETURN 9FT ADLT (ELECTROSURGICAL) ×3
ELECTRODE REM PT RTRN 9FT ADLT (ELECTROSURGICAL) ×1 IMPLANT
GAUZE 4X4 16PLY RFD (DISPOSABLE) IMPLANT
GAUZE SPONGE 4X4 12PLY STRL (GAUZE/BANDAGES/DRESSINGS) ×3 IMPLANT
GLOVE BIOGEL PI IND STRL 7.5 (GLOVE) ×2 IMPLANT
GLOVE BIOGEL PI INDICATOR 7.5 (GLOVE) ×4
GLOVE ECLIPSE 6.5 STRL STRAW (GLOVE) ×6 IMPLANT
GLOVE EXAM NITRILE XL STR (GLOVE) IMPLANT
GLOVE SURG SS PI 7.0 STRL IVOR (GLOVE) ×12 IMPLANT
GOWN STRL REUS W/ TWL LRG LVL3 (GOWN DISPOSABLE) ×2 IMPLANT
GOWN STRL REUS W/ TWL XL LVL3 (GOWN DISPOSABLE) ×1 IMPLANT
GOWN STRL REUS W/TWL 2XL LVL3 (GOWN DISPOSABLE) IMPLANT
GOWN STRL REUS W/TWL LRG LVL3 (GOWN DISPOSABLE) ×6
GOWN STRL REUS W/TWL XL LVL3 (GOWN DISPOSABLE) ×3
HEMOSTAT SURGICEL 2X14 (HEMOSTASIS) IMPLANT
KIT BASIN OR (CUSTOM PROCEDURE TRAY) ×3 IMPLANT
KIT TURNOVER KIT B (KITS) ×3 IMPLANT
NEEDLE HYPO 25X1 1.5 SAFETY (NEEDLE) IMPLANT
NS IRRIG 1000ML POUR BTL (IV SOLUTION) ×9 IMPLANT
OIL CARTRIDGE MAESTRO DRILL (MISCELLANEOUS)
PACK CRANIOTOMY CUSTOM (CUSTOM PROCEDURE TRAY) ×3 IMPLANT
PAD ARMBOARD 7.5X6 YLW CONV (MISCELLANEOUS) ×9 IMPLANT
PIN MAYFIELD SKULL DISP (PIN) IMPLANT
SET CRAINOPLASTY (SET/KITS/TRAYS/PACK) IMPLANT
SPONGE SURGIFOAM ABS GEL 100 (HEMOSTASIS) ×3 IMPLANT
STAPLER SKIN PROX WIDE 3.9 (STAPLE) ×3 IMPLANT
STOCKINETTE TUBULAR 6 INCH (GAUZE/BANDAGES/DRESSINGS) ×3 IMPLANT
SUT ETHILON 3 0 FSL (SUTURE) IMPLANT
SUT NURALON 4 0 TR CR/8 (SUTURE) IMPLANT
SUT VIC AB 2-0 CT2 18 VCP726D (SUTURE) IMPLANT
TOWEL GREEN STERILE (TOWEL DISPOSABLE) ×3 IMPLANT
TOWEL GREEN STERILE FF (TOWEL DISPOSABLE) ×3 IMPLANT
TRAY FOLEY MTR SLVR 16FR STAT (SET/KITS/TRAYS/PACK) IMPLANT
UNDERPAD 30X36 HEAVY ABSORB (UNDERPADS AND DIAPERS) IMPLANT
WATER STERILE IRR 1000ML POUR (IV SOLUTION) ×3 IMPLANT

## 2020-03-21 NOTE — Progress Notes (Signed)
Pharmacy Antibiotic Note  Harold Torres is a 44 y.o. male admitted on 03/21/2020 with scalp wound infection s/p I&D post craniotomy site. Cr stable 0.7, afebrile, 9/17 wound cx growing GNR. Pharmacy has been consulted for vancomyin and cefepime  dosing. Pre-op vanc 1gm give at 4pm   Plan: Vancomycin 1500mg  IV q12h  Cefepime 2gm IV q8h   Height: 5\' 5"  (165.1 cm) Weight: 86.2 kg (190 lb) IBW/kg (Calculated) : 61.5  Temp (24hrs), Avg:98 F (36.7 C), Min:97.7 F (36.5 C), Max:98.7 F (37.1 C)  Recent Labs  Lab 03/21/20 1343  CREATININE 0.76    Estimated Creatinine Clearance: 121.5 mL/min (by C-G formula based on SCr of 0.76 mg/dL).    No Known Allergies  Antimicrobials this admission: vanc 9/17> Cefepime 9/17>  Dose adjustments this admission:  Microbiology results: 9/17 wound Cx GNR   Bonnita Nasuti Pharm.D. CPP, BCPS Clinical Pharmacist [phone] 03/21/2020 9:36 PM

## 2020-03-21 NOTE — H&P (Signed)
Mr. Sweetman presented to the office today with purulent drainage from scalp incision.  Cc: Wound infection status post cranioplasty No Known Allergies Past Medical History:  Diagnosis Date  . Diabetes mellitus without complication ([place])   . Generalized skin cysts    back x 2, left leg, hand  . Hepatitis    Hepatitis B  . Meningioma [facility])    Past Surgical History:  Procedure Laterality Date  . CRANIOPLASTY N/A 03/14/2020   Procedure: CRANIOPLASTY;  Surgeon: Ashok Pall, MD;  Location: [place];  Service: Neurosurgery;  Laterality: N/A;  . CRANIOTOMY Bilateral 01/31/2020   Procedure: OCCIPITAL CRANIOTOMY FOR MASS;  Surgeon: Ashok Pall, MD;  Location: [place];  Service: Neurosurgery;  Laterality: Bilateral;  . CRANIOTOMY N/A 02/08/2020   Procedure: CRANIECTOMY FOR TUMOR;  Surgeon: Ashok Pall, MD;  Location: [place];  Service: Neurosurgery;  Laterality: N/A;  CRANIECTOMY FOR TUMOR   No family history on file. Social History   Socioeconomic History  . Marital status: Married    Spouse name: Not on file  . Number of children: Not on file  . Years of education: Not on file  . Highest education level: Not on file  Occupational History  . Not on file  Tobacco Use  . Smoking status: Former Smoker    Types: Cigarettes  . Smokeless tobacco: Never Used  . Tobacco comment: at age [age]  Vaping Use  . Vaping Use: Never used  Substance and Sexual Activity  . Alcohol use: Never  . Drug use: Never  . Sexual activity: Yes    Birth control/protection: Condom  Other Topics Concern  . Not on file  Social History Narrative  . Not on file   Social Determinants of Health   Financial Resource Strain:   . Difficulty of Paying Living Expenses: Not on file  Food Insecurity:   . Worried About Charity fundraiser in the Last Year: Not on file  . Ran Out of Food in the Last Year: Not on file  Transportation Needs:   . Lack of Transportation (Medical): Not on file  . Lack of Transportation  (Non-Medical): Not on file  Physical Activity:   . Days of Exercise per Week: Not on file  . Minutes of Exercise per Session: Not on file  Stress:   . Feeling of Stress : Not on file  Social Connections:   . Frequency of Communication with Friends and Family: Not on file  . Frequency of Social Gatherings with Friends and Family: Not on file  . Attends Religious Services: Not on file  . Active Member of Clubs or Organizations: Not on file  . Attends Archivist Meetings: Not on file  . Marital Status: Not on file  Intimate Partner Violence:   . Fear of Current or Ex-Partner: Not on file  . Emotionally Abused: Not on file  . Physically Abused: Not on file  . Sexually Abused: Not on file   Prior to Admission medications   Medication Sig Start Date End Date Taking? Authorizing Provider  HYDROcodone-acetaminophen (NORCO/VICODIN) 5-325 MG tablet Take 1 tablet by mouth every 6 (six) hours as needed for moderate pain. 03/15/20   Ashok Pall, MD  metFORMIN (GLUCOPHAGE-XR) 500 MG 24 hr tablet Take 1,500 mg by mouth in the morning and at bedtime.  11/08/19   [provider]   Physical Exam Constitutional:      Appearance: Normal appearance. He is normal weight.  HENT:     Head:  Comments: balotable fluid collection underneath scalp incision    Nose: Nose normal.     Mouth/Throat:     Mouth: Mucous membranes are moist.     Pharynx: Oropharynx is clear.  Eyes:     Extraocular Movements: Extraocular movements intact.     Pupils: Pupils are equal, round, and reactive to light.  Cardiovascular:     Rate and Rhythm: Normal rate and regular rhythm.     Pulses: Normal pulses.     Heart sounds: Normal heart sounds.  Pulmonary:     Effort: Pulmonary effort is normal.     Breath sounds: Normal breath sounds.  Abdominal:     Palpations: Abdomen is soft.  Musculoskeletal:        General: Normal range of motion.     Cervical back: Normal range of motion and neck supple.   Skin:    General: Skin is warm and dry.  Neurological:     General: No focal deficit present.     Mental Status: He is alert and oriented to person, place, and time.     Cranial Nerves: No cranial nerve deficit.     Sensory: No sensory deficit.     Motor: No weakness.     Coordination: Coordination normal.     Gait: Gait normal.     Deep Tendon Reflexes: Reflexes normal.  Psychiatric:        Mood and Affect: Mood normal.        Behavior: Behavior normal.        Thought Content: Thought content normal.        Judgment: Judgment normal.   OR for cranioplasty removal, and debridement.

## 2020-03-21 NOTE — Anesthesia Preprocedure Evaluation (Addendum)
Anesthesia Evaluation  Patient identified by MRN, date of birth, ID band Patient awake    Reviewed: Allergy & Precautions, NPO status , Patient's Chart, lab work & pertinent test results  Airway Mallampati: II  TM Distance: >3 FB Neck ROM: Full    Dental no notable dental hx. (+) Teeth Intact, Dental Advisory Given   Pulmonary former smoker,    Pulmonary exam normal breath sounds clear to auscultation       Cardiovascular Exercise Tolerance: Good negative cardio ROS Normal cardiovascular exam Rhythm:Regular Rate:Normal     Neuro/Psych negative neurological ROS  negative psych ROS   GI/Hepatic negative GI ROS, Neg liver ROS, (+) Hepatitis -  Endo/Other  diabetes, Well Controlled, Type 2, Oral Hypoglycemic Agents  Renal/GU      Musculoskeletal negative musculoskeletal ROS (+)   Abdominal (+) + obese,   Peds  Hematology negative hematology ROS (+)   Anesthesia Other Findings   Reproductive/Obstetrics                           Anesthesia Physical Anesthesia Plan  ASA: II  Anesthesia Plan: General   Post-op Pain Management:    Induction: Intravenous  PONV Risk Score and Plan: 3 and Treatment may vary due to age or medical condition, Ondansetron and Dexamethasone  Airway Management Planned: Oral ETT  Additional Equipment: None  Intra-op Plan:   Post-operative Plan: Extubation in OR  Informed Consent: I have reviewed the patients History and Physical, chart, labs and discussed the procedure including the risks, benefits and alternatives for the proposed anesthesia with the patient or authorized representative who has indicated his/her understanding and acceptance.     Dental advisory given and Interpreter used for interveiw  Plan Discussed with: CRNA and Anesthesiologist  Anesthesia Plan Comments: (Mandarin Video interpreter used)        Anesthesia Quick Evaluation

## 2020-03-21 NOTE — Anesthesia Procedure Notes (Signed)
Procedure Name: Intubation Date/Time: 03/21/2020 4:19 PM Performed by: Kewana Sanon T, CRNA Pre-anesthesia Checklist: Patient identified, Emergency Drugs available, Suction available and Patient being monitored Patient Re-evaluated:Patient Re-evaluated prior to induction Oxygen Delivery Method: Circle system utilized Preoxygenation: Pre-oxygenation with 100% oxygen Induction Type: IV induction Ventilation: Mask ventilation without difficulty Laryngoscope Size: Mac and 4 Grade View: Grade I Tube type: Oral Tube size: 7.5 mm Number of attempts: 1 Airway Equipment and Method: Stylet and Oral airway Placement Confirmation: ETT inserted through vocal cords under direct vision,  positive ETCO2 and breath sounds checked- equal and bilateral Secured at: 22 cm Tube secured with: Tape Dental Injury: Teeth and Oropharynx as per pre-operative assessment

## 2020-03-21 NOTE — Transfer of Care (Signed)
Immediate Anesthesia Transfer of Care Note  Patient: Harold Torres  Procedure(s) Performed: Cranial wound incision and drainage (N/A Head)  Patient Location: PACU  Anesthesia Type:General  Level of Consciousness: drowsy  Airway & Oxygen Therapy: Patient Spontanous Breathing and Patient connected to nasal cannula oxygen  Post-op Assessment: Report given to RN, Post -op Vital signs reviewed and stable and Patient moving all extremities  Post vital signs: Reviewed and stable  Last Vitals:  Vitals Value Taken Time  BP 106/68 03/21/20 1721  Temp    Pulse 89 03/21/20 1725  Resp 18 03/21/20 1725  SpO2 100 % 03/21/20 1725  Vitals shown include unvalidated device data.  Last Pain:  Vitals:   03/21/20 1313  TempSrc:   PainSc: 2       Patients Stated Pain Goal: 3 (20/72/18 2883)  Complications: No complications documented.

## 2020-03-21 NOTE — Op Note (Signed)
03/21/2020  5:42 PM  PATIENT:  Harold Torres  44 y.o. male With purulent drainage from scalp incision PRE-OPERATIVE DIAGNOSIS:  Cranial/Scalp wound infection  POST-OPERATIVE DIAGNOSIS:  Cranial/Scalp wound infection  PROCEDURE:  Procedure(s): Cranial wound incision and drainage  SURGEON: Surgeon(s): Ashok Pall, MD  ASSISTANTS:none  ANESTHESIA:   general  EBL:  Total I/O In: 1335.5 [I.V.:1035.5; Other:50; IV Piggyback:250] Out: 100 [Blood:100]  BLOOD ADMINISTERED:none  CELL SAVER GIVEN:none  COUNT:per nursing  DRAINS: none   SPECIMEN:  Source of Specimen:  wound  DICTATION: Harold Torres was taken to the operating room, intubated, and placed under a general anesthetic without difficulty. He was positioned prone with his head on a horseshoe head rest. His head was prepped and draped in a sterile manner. I removed the sutures and pus drained immediately. I took a culture. Then placed a retractor to expose the cranioplasty. I removed the peek implant. I then irrigated the wound with 3 liters of saline. I placed bovine pericardium over the skull defect. I then placed gauze in the space. I placed an ABD over than and secured the dressing with a stockinette placed on his head.   PLAN OF CARE: Admit to inpatient   PATIENT DISPOSITION:  PACU - hemodynamically stable.   Delay start of Pharmacological VTE agent (>24hrs) due to surgical blood loss or risk of bleeding:  no

## 2020-03-22 LAB — GLUCOSE, CAPILLARY: Glucose-Capillary: 225 mg/dL — ABNORMAL HIGH (ref 70–99)

## 2020-03-22 MED ORDER — INSULIN ASPART 100 UNIT/ML ~~LOC~~ SOLN
0.0000 [IU] | Freq: Three times a day (TID) | SUBCUTANEOUS | Status: DC
  Administered 2020-03-22: 3 [IU] via SUBCUTANEOUS
  Administered 2020-03-23: 8 [IU] via SUBCUTANEOUS
  Administered 2020-03-23: 3 [IU] via SUBCUTANEOUS
  Administered 2020-03-24: 5 [IU] via SUBCUTANEOUS
  Administered 2020-03-24 (×2): 11 [IU] via SUBCUTANEOUS
  Administered 2020-03-25: 8 [IU] via SUBCUTANEOUS
  Administered 2020-03-25: 3 [IU] via SUBCUTANEOUS
  Administered 2020-03-25: 11 [IU] via SUBCUTANEOUS
  Administered 2020-03-26 (×2): 8 [IU] via SUBCUTANEOUS
  Administered 2020-03-26 – 2020-03-27 (×2): 3 [IU] via SUBCUTANEOUS
  Administered 2020-03-27: 8 [IU] via SUBCUTANEOUS
  Administered 2020-03-27: 11 [IU] via SUBCUTANEOUS
  Administered 2020-03-28 (×2): 2 [IU] via SUBCUTANEOUS
  Administered 2020-03-28: 11 [IU] via SUBCUTANEOUS
  Administered 2020-03-29 (×2): 2 [IU] via SUBCUTANEOUS
  Administered 2020-03-29 – 2020-03-30 (×2): 5 [IU] via SUBCUTANEOUS
  Administered 2020-03-30 (×2): 2 [IU] via SUBCUTANEOUS
  Administered 2020-03-31: 5 [IU] via SUBCUTANEOUS
  Administered 2020-03-31: 8 [IU] via SUBCUTANEOUS
  Administered 2020-03-31: 3 [IU] via SUBCUTANEOUS
  Administered 2020-04-01: 2 [IU] via SUBCUTANEOUS
  Administered 2020-04-01 – 2020-04-02 (×3): 3 [IU] via SUBCUTANEOUS
  Administered 2020-04-02: 2 [IU] via SUBCUTANEOUS
  Administered 2020-04-02: 3 [IU] via SUBCUTANEOUS
  Administered 2020-04-03: 8 [IU] via SUBCUTANEOUS
  Administered 2020-04-03: 2 [IU] via SUBCUTANEOUS
  Administered 2020-04-03: 3 [IU] via SUBCUTANEOUS
  Administered 2020-04-04: 2 [IU] via SUBCUTANEOUS
  Administered 2020-04-04 – 2020-04-06 (×6): 3 [IU] via SUBCUTANEOUS
  Administered 2020-04-06: 8 [IU] via SUBCUTANEOUS
  Administered 2020-04-06 – 2020-04-07 (×3): 5 [IU] via SUBCUTANEOUS
  Administered 2020-04-07: 3 [IU] via SUBCUTANEOUS

## 2020-03-22 MED ORDER — INSULIN ASPART 100 UNIT/ML ~~LOC~~ SOLN
0.0000 [IU] | Freq: Every day | SUBCUTANEOUS | Status: DC
  Administered 2020-03-22: 2 [IU] via SUBCUTANEOUS
  Administered 2020-03-23: 3 [IU] via SUBCUTANEOUS
  Administered 2020-03-24: 5 [IU] via SUBCUTANEOUS
  Administered 2020-03-26 – 2020-03-29 (×3): 2 [IU] via SUBCUTANEOUS
  Administered 2020-03-30: 3 [IU] via SUBCUTANEOUS
  Administered 2020-03-31: 2 [IU] via SUBCUTANEOUS

## 2020-03-22 NOTE — Progress Notes (Signed)
Patient ID: Harold Torres, male   DOB: 06-05-1976, 44 y.o.   MRN: [id number] BP (!) 92/55 (BP Location: Left Arm)   Pulse 82   Temp 98.1 F (36.7 C)   Resp 20   Ht 5\' 5"  (1.651 m)   Wt 86.2 kg   SpO2 96%   BMI 31.62 kg/m  Alert and oriented x 4, speech is clear, and fluent gnr growing on cultures. Unusual organism. Pharmacy has added cefipime to the vancomycin. Will consult id beginning of the week.  Wound looks very good, I changed the dressing earlier. Scalp and skull clean, dry.

## 2020-03-22 NOTE — Anesthesia Postprocedure Evaluation (Signed)
Anesthesia Post Note  Patient: Harold Torres  Procedure(s) Performed: Cranial wound incision and drainage (N/A Head)     Patient location during evaluation: PACU Anesthesia Type: General Level of consciousness: sedated and patient cooperative Pain management: pain level controlled Vital Signs Assessment: post-procedure vital signs reviewed and stable Respiratory status: spontaneous breathing Cardiovascular status: stable Anesthetic complications: no   No complications documented.  Last Vitals:  Vitals:   03/22/20 1117 03/22/20 1616  BP: (!) 92/55 (!) 97/53  Pulse: 82 91  Resp: 20 20  Temp: 36.7 C 36.7 C  SpO2: 96% 95%    Last Pain:  Vitals:   03/22/20 0719  TempSrc:   PainSc: 0-No pain                 Nolon Nations

## 2020-03-22 NOTE — Progress Notes (Signed)
Dressing changed by Dr. Christella Noa. Patient tolerated well. Dr. Christella Noa will be change the dressing daily.

## 2020-03-22 NOTE — Progress Notes (Signed)
Phone report received from PACU nurse. Pt arrived to unit on a bed. AA/Ox4, follow commands, and pleasant. VVS. No c/o pain or discomfort voiced. Gauze and stocking noted to head with small strike through drainage. Pt oriented to room and unit. Call light within reach and safety measures intact.

## 2020-03-23 LAB — BASIC METABOLIC PANEL
Anion gap: 10 (ref 5–15)
BUN: 9 mg/dL (ref 6–20)
CO2: 23 mmol/L (ref 22–32)
Calcium: 8.3 mg/dL — ABNORMAL LOW (ref 8.9–10.3)
Chloride: 105 mmol/L (ref 98–111)
Creatinine, Ser: 0.75 mg/dL (ref 0.61–1.24)
GFR calc Af Amer: >60 mL/min (ref >60)
GFR calc non Af Amer: >60 mL/min (ref >60)
Glucose, Bld: 251 mg/dL — ABNORMAL HIGH (ref 70–99)
Potassium: 3.8 mmol/L (ref 3.5–5.1)
Sodium: 138 mmol/L (ref 135–145)

## 2020-03-23 LAB — GLUCOSE, CAPILLARY
Glucose-Capillary: 161 mg/dL — ABNORMAL HIGH (ref 70–99)
Glucose-Capillary: 295 mg/dL — ABNORMAL HIGH (ref 70–99)
Glucose-Capillary: 208 mg/dL — ABNORMAL HIGH (ref 70–99)
Glucose-Capillary: 279 mg/dL — ABNORMAL HIGH (ref 70–99)

## 2020-03-23 NOTE — Progress Notes (Signed)
NEUROSURGERY PROGRESS NOTE  Doing well. No acute events overnight. ABX broadened with cefipime and vanc yesterday. Planning for ID input tomorrow. Awake alert and orientedx4  Temp:  [98.1 F (36.7 C)-98.5 F (36.9 C)] 98.3 F (36.8 C) (09/19 0725) Pulse Rate:  [70-91] 74 (09/19 0725) Resp:  [16-20] 20 (09/19 0725) BP: (92-103)/(53-74) 96/65 (09/19 0725) SpO2:  [94 %-96 %] 95 % (09/19 0725)   Eleonore Chiquito, NP 03/23/2020 8:37 AM

## 2020-03-24 ENCOUNTER — Encounter (HOSPITAL_COMMUNITY): Payer: Self-pay | Admitting: Neurosurgery

## 2020-03-24 DIAGNOSIS — B1910 Unspecified viral hepatitis B without hepatic coma: Secondary | ICD-10-CM

## 2020-03-24 DIAGNOSIS — T8149XA Infection following a procedure, other surgical site, initial encounter: Secondary | ICD-10-CM

## 2020-03-24 LAB — GLUCOSE, CAPILLARY
Glucose-Capillary: 331 mg/dL — ABNORMAL HIGH (ref 70–99)
Glucose-Capillary: 312 mg/dL — ABNORMAL HIGH (ref 70–99)
Glucose-Capillary: 236 mg/dL — ABNORMAL HIGH (ref 70–99)
Glucose-Capillary: 389 mg/dL — ABNORMAL HIGH (ref 70–99)

## 2020-03-24 LAB — HEMOGLOBIN A1C
Hgb A1c MFr Bld: 6.8 % — ABNORMAL HIGH (ref 4.8–5.6)
Mean Plasma Glucose: 148.46 mg/dL

## 2020-03-24 MED ORDER — SODIUM CHLORIDE 0.9 % IV SOLN
2.0000 g | Freq: Two times a day (BID) | INTRAVENOUS | Status: AC
  Administered 2020-03-24 – 2020-04-03 (×21): 2 g via INTRAVENOUS
  Filled 2020-03-24 (×21): qty 20

## 2020-03-24 MED ORDER — INSULIN DETEMIR 100 UNIT/ML ~~LOC~~ SOLN
12.0000 [IU] | Freq: Every day | SUBCUTANEOUS | Status: DC
  Administered 2020-03-24 – 2020-04-07 (×14): 12 [IU] via SUBCUTANEOUS
  Filled 2020-03-24 (×18): qty 0.12

## 2020-03-24 NOTE — Progress Notes (Signed)
Inpatient Diabetes Program Recommendations  AACE/ADA: New Consensus Statement on Inpatient Glycemic Control (2015)  Target Ranges:  Prepandial:   less than 140 mg/dL      Peak postprandial:   less than 180 mg/dL (1-2 hours)      Critically ill patients:  140 - 180 mg/dL   Lab Results  Component Value Date   GLUCAP 312 (H) 03/24/2020   HGBA1C 7.1 (H) 01/31/2020    Review of Glycemic Control Results for Harold Torres, Harold Torres (MRN [id number]) as of 03/24/2020 13:13  Ref. Range 03/23/2020 15:15 03/23/2020 22:43 03/24/2020 07:29 03/24/2020 11:34  Glucose-Capillary Latest Ref Range: 70 - 99 mg/dL 208 (H) 279 (H) 331 (H) 312 (H)   Diabetes history: Type 2 DM Outpatient Diabetes medications: Metformin 1500 mg BID Current orders for Inpatient glycemic control: Novolog 0-15 units TID, Novolog 0-5 units QHS  Inpatient Diabetes Program Recommendations:    Consider adding A1C. Per chart review on 11/06/2019 A1C was 13.1%, appears down in July to 7.1%.  However, suspect that glucose trends are increased due to infection and administration of Decadron 9/18.  Consider adding Levemir 12 units QD.   Thanks, Bronson Curb, MSN, RNC-OB Diabetes Coordinator [phone] (8a-5p)

## 2020-03-24 NOTE — Consult Note (Signed)
[facility] for Infectious Disease    Date of Admission:  03/21/2020   Total days of antibiotics: 4        Current antibiotics:        Day 4 cefepime        Day 4 vancomycin  Reason for Consult: cranioplasty wound infection    Referring Physician: Dr Christella Noa  Active Problems:   Postoperative wound infection   Hep B w/o coma    Assessment: 44 y.o. male with recent neurosurgery cranioplasty on 03/14/20 and presented about 1 week post operatively with drainage from his scalp wound.  S/p OR for debridement and cultures have been isolated to pan-sensitive Kleb pna.  No MRSA or other Gram positives isolated.  Patient is afebrile without leukocytosis and does not appear to have a deeper seated infection.  Problem List and Recommendations: # Postoperative wound infection with Kleb pneumonia -- stop vancomycin and cefepime -- start ceftriaxone 2 gm IV daily -- wound care -- glycemic control -- if clinical decline, would consider imaging  # Chronic HBV -- he should follow up with his primary HBV provider.  Currently not in active flare with recently normal LFTs     HPI:  Harold Torres is a 44 y.o. male man with type 2 diabetes, HBV, and meningioma s/p recent craniotomy and craniectomy (July/August 2021) and cranioplasty (03/14/20) with neurosurgery.  He was discharged home on 03/15/20 and recovering at home.  He presented 03/21/20 and noted to have purulent drainage from his scalp incision that started about 3 days prior.  He described the drainage as pink.  He denied fevers, chills or significant pain.  He was taken to the OR where pus was noted and cultures obtained show Kleb pneumonia.  Peek implant was removed and was irrigated with 3 liters of saline.  Bovine pericardium was placed over the skull defect.  He has been managed with cefepime and vancomycin thus far.  Afebrile since admission and WBC on admission was 8.5.  Regarding HBV infection, per chart review, "Viral load 410,000  as May 2021. No significant LFT derangements. Prescribed Vemlidy by PCP. "  However, patient reports not taking anything nor has he ever.  There is no record of this having been filled as an outpatient, but [facility] notes confirm his GI's plan for HBV treatment.     Scheduled Meds: . docusate sodium  100 mg Oral BID  . heparin injection (subcutaneous)  5,000 Units Subcutaneous Q8H  . insulin aspart  0-15 Units Subcutaneous TID WC  . insulin aspart  0-5 Units Subcutaneous QHS   Continuous Infusions: . 0.9 % NaCl with KCl 20 mEq / L 80 mL/hr at 03/24/20 0332  . ceFEPime (MAXIPIME) IV 2 g (03/24/20 1303)  . vancomycin 1,500 mg (03/24/20 1544)   PRN Meds:.acetaminophen **OR** acetaminophen, bisacodyl, HYDROcodone-acetaminophen, magnesium citrate, morphine injection, naLOXone (NARCAN)  injection, ondansetron **OR** ondansetron (ZOFRAN) IV, senna-docusate     Past Medical History:  Diagnosis Date  . 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)   . Generalized skin cysts    back x 2, left leg, hand  . Hepatitis    Hepatitis B  . Meningioma [facility])     Social History   Tobacco Use  . Smoking status: Former Smoker    Types: Cigarettes  . Smokeless tobacco: Never Used  . Tobacco comment: at age [age]  Vaping Use  . Vaping Use: Never used  Substance Use Topics  . Alcohol use: Never  . Drug  use: Never    History reviewed. No pertinent family history.  No Known Allergies   Review of Systems: Review of Systems  Constitutional: Negative for chills and fever.  Respiratory: Negative for cough.   Gastrointestinal: Negative for abdominal pain, nausea and vomiting.  Skin:       Drainage from incision.  Neurological: Negative for focal weakness.  All other systems reviewed and are negative.    Objective: Blood pressure 113/78, pulse 85, temperature 98.4 F (36.9 C), temperature source Oral, resp. rate 19, height 5\' 5"  (1.651 m), weight 86.2 kg, SpO2 94 %. Body mass index  is 31.62 kg/m.  Physical Exam Constitutional:      General: He is not in acute distress.    Appearance: Normal appearance.  HENT:     Head: Normocephalic.     Comments: Large cranial wound defect, wound packed, no drainage, purulence or erythema.     Nose: Nose normal.  Eyes:     Extraocular Movements: Extraocular movements intact.     Conjunctiva/sclera: Conjunctivae normal.  Cardiovascular:     Rate and Rhythm: Normal rate and regular rhythm.     Heart sounds: No murmur heard.   Pulmonary:     Effort: Pulmonary effort is normal.     Breath sounds: Normal breath sounds. No wheezing.  Abdominal:     General: Abdomen is flat. There is no distension.     Palpations: Abdomen is soft.     Tenderness: There is no abdominal tenderness.  Musculoskeletal:        General: Normal range of motion.     Cervical back: Normal range of motion and neck supple.  Skin:    General: Skin is warm and dry.  Neurological:     General: No focal deficit present.     Mental Status: He is alert and oriented to person, place, and time.  Psychiatric:        Mood and Affect: Mood normal.        Behavior: Behavior normal.     Lab Results: Lab Results  Component Value Date   WBC 8.5 03/21/2020   HGB 11.9 (L) 03/21/2020   HCT 37.2 (L) 03/21/2020   MCV 85.7 03/21/2020   PLT 241 03/21/2020    Lab Results  Component Value Date   CREATININE 0.75 03/23/2020   BUN 9 03/23/2020   NA 138 03/23/2020   K 3.8 03/23/2020   CL 105 03/23/2020   CO2 23 03/23/2020    Lab Results  Component Value Date   ALT 31 03/21/2020   AST 19 03/21/2020   ALKPHOS 76 03/21/2020     Microbiology: Recent Results (from the past 240 hour(s))  SARS Coronavirus 2 by RT PCR (hospital order, performed in [facility] hospital lab) Nasopharyngeal Nasopharyngeal Swab     Status: None   Collection Time: 03/21/20 12:53 PM   Specimen: Nasopharyngeal Swab  Result Value Ref Range Status   SARS Coronavirus 2 NEGATIVE NEGATIVE  Final    Comment: (NOTE) SARS-CoV-2 target nucleic acids are NOT DETECTED.  The SARS-CoV-2 RNA is generally detectable in upper and lower respiratory specimens during the acute phase of infection. The lowest concentration of SARS-CoV-2 viral copies this assay can detect is 250 copies / mL. A negative result does not preclude SARS-CoV-2 infection and should not be used as the sole basis for treatment or other patient management decisions.  A negative result may occur with improper specimen collection / handling, submission of specimen other than nasopharyngeal  swab, presence of viral mutation(s) within the areas targeted by this assay, and inadequate number of viral copies (<250 copies / mL). A negative result must be combined with clinical observations, patient history, and epidemiological information.  Fact Sheet for Patients:   StrictlyIdeas.no  Fact Sheet for Healthcare Providers: BankingDealers.co.za  This test is not yet approved or  cleared by the [place] FDA and has been authorized for detection and/or diagnosis of SARS-CoV-2 by FDA under an Emergency Use Authorization (EUA).  This EUA will remain in effect (meaning this test can be used) for the duration of the COVID-19 declaration under Section 564(b)(1) of the Act, 21 U.S.C. section 360bbb-3(b)(1), unless the authorization is terminated or revoked sooner.  Performed at [facility], [place] [address], [place], [place] [address]   Aerobic/Anaerobic Culture (surgical/deep wound)     Status: None (Preliminary result)   Collection Time: 03/21/20  4:40 PM   Specimen: Wound  Result Value Ref Range Status   Specimen Description WOUND  Final   Special Requests CRANIOTOMY INFECTION  Final   Gram Stain   Final    ABUNDANT WBC PRESENT,BOTH PMN AND MONONUCLEAR MODERATE GRAM NEGATIVE RODS Performed at [facility], [place] [address], [place], [place] [address]     Culture   Final    FEW KLEBSIELLA PNEUMONIAE NO ANAEROBES ISOLATED; CULTURE IN PROGRESS FOR 5 DAYS    Report Status PENDING  Incomplete   Organism ID, Bacteria KLEBSIELLA PNEUMONIAE  Final      Susceptibility   Klebsiella pneumoniae - MIC*    AMPICILLIN >=32 RESISTANT Resistant     CEFAZOLIN <=4 SENSITIVE Sensitive     CEFEPIME <=0.12 SENSITIVE Sensitive     CEFTAZIDIME <=1 SENSITIVE Sensitive     CEFTRIAXONE <=0.25 SENSITIVE Sensitive     CIPROFLOXACIN <=0.25 SENSITIVE Sensitive     GENTAMICIN <=1 SENSITIVE Sensitive     IMIPENEM <=0.25 SENSITIVE Sensitive     TRIMETH/SULFA <=20 SENSITIVE Sensitive     AMPICILLIN/SULBACTAM 4 SENSITIVE Sensitive     PIP/TAZO <=4 SENSITIVE Sensitive     * FEW KLEBSIELLA PNEUMONIAE    Raynelle Highland for Infectious Disease [facility] 03/24/2020, 3:53 PM

## 2020-03-24 NOTE — Progress Notes (Signed)
Patient ID: Harold Torres, male   DOB: [birth year], 44 y.o.   MRN: [id number] BP 113/78 (BP Location: Left Arm)   Pulse 85   Temp 98.4 F (36.9 C) (Oral)   Resp 19   Ht 5\' 5"  (1.651 m)   Wt 86.2 kg   SpO2 94%   BMI 31.62 kg/m  Alert and oriented x 4, speech is clear and fluent Moving all extremities well Wound looks good. I changed the dressing tonight. Base and scalp are clean, no purulence noted.  ID has seen, appropriate antibiotic now in place.  Will finish course of recommended abx, then plan on flap replacement.

## 2020-03-25 LAB — GLUCOSE, CAPILLARY
Glucose-Capillary: 192 mg/dL — ABNORMAL HIGH (ref 70–99)
Glucose-Capillary: 279 mg/dL — ABNORMAL HIGH (ref 70–99)
Glucose-Capillary: 338 mg/dL — ABNORMAL HIGH (ref 70–99)
Glucose-Capillary: 179 mg/dL — ABNORMAL HIGH (ref 70–99)

## 2020-03-25 NOTE — Progress Notes (Signed)
Inpatient Diabetes Program Recommendations  AACE/ADA: New Consensus Statement on Inpatient Glycemic Control (2015)  Target Ranges:  Prepandial:   less than 140 mg/dL      Peak postprandial:   less than 180 mg/dL (1-2 hours)      Critically ill patients:  140 - 180 mg/dL   Lab Results  Component Value Date   GLUCAP 279 (H) 03/25/2020   HGBA1C 6.8 (H) 03/24/2020    Review of Glycemic Control Results for Harold Torres, Harold Torres (MRN [id number]) as of 03/25/2020 12:24  Ref. Range 03/24/2020 15:43 03/24/2020 23:03 03/25/2020 07:29 03/25/2020 12:01  Glucose-Capillary Latest Ref Range: 70 - 99 mg/dL 236 (H) 389 (H) 192 (H) 279 (H)   Diabetes history: Type 2 DM Outpatient Diabetes medications: Metformin 1500 mg BID Current orders for Inpatient glycemic control: Novolog 0-15 units TID, Novolog 0-5 units QHS, Levemir 12 units QD  Inpatient Diabetes Program Recommendations:    Consider further titration to insulin: -Increase Levemir to 14 units QHS -Add Novolog 4 units TID (Assuming patient is consuming >50% of meal).   Thanks, Bronson Curb, MSN, RNC-OB Diabetes Coordinator [phone] (8a-5p)

## 2020-03-25 NOTE — Progress Notes (Signed)
Patient ID: Harold Torres, male   DOB: 10-22-1975, 44 y.o.   MRN: [id number] BP 97/68 (BP Location: Left Arm)   Pulse 80   Temp 97.9 F (36.6 C) (Oral)   Resp 11   Ht 5\' 5"  (1.651 m)   Wt 86.2 kg   SpO2 93%   BMI 31.62 kg/m  Alert and oriented x 4 Speech is clear and fluent Moving all extremities well Dressing stained, dry Appreciate ID, will give antibiotics for 2 weeks before replacing flap.

## 2020-03-25 NOTE — Progress Notes (Signed)
[facility] for Infectious Disease  Date of Admission:  03/21/2020   Total days of antibiotics: 5        Current antibiotics:         Day 2 ceftriaxone          Reason for visit: Follow up on cranioplasty wound infection  Interval Events: Afebrile No acute events noted  Active Problems:   Postoperative wound infection   Hep B w/o coma   Assessment: 44 y.o. male with recent neurosurgery cranioplasty on 03/14/20.  He presented about 1 week post operatively with drainage from his scalp wound but no fevers or chills.  S/p OR for debridement and removal of implant on 03/21/20.  His OR cultures have been isolated to pan-sensitive Kleb pna.  No MRSA or other Gram positives isolated.  Patient is afebrile without leukocytosis and does not appear to have a deeper seated infection at this time warranting a prolonged antibiotic course.  However, given the area of involvement I favor a slightly longer duration than a simple soft tissue infection and will plan for about 2 weeks of therapy from washout on 9/17.   Problem List and Recommendations:  # Postoperative wound infection with Kleb pneumonia -- continue ceftriaxone -- wound care -- glycemic control -- if clinical decline, would consider imaging  -- I will tentatively plan for 2 weeks of antibiotics from washout  # Chronic HBV -- he should follow up with his primary HBV provider.  Currently not in active flare with recently normal LFTs.    Scheduled Meds: . docusate sodium  100 mg Oral BID  . heparin injection (subcutaneous)  5,000 Units Subcutaneous Q8H  . insulin aspart  0-15 Units Subcutaneous TID WC  . insulin aspart  0-5 Units Subcutaneous QHS  . insulin detemir  12 Units Subcutaneous q1800   Continuous Infusions: . 0.9 % NaCl with KCl 20 mEq / L 80 mL/hr at 03/24/20 2320  . cefTRIAXone (ROCEPHIN)  IV 2 g (03/24/20 2136)   PRN Meds:.acetaminophen **OR** acetaminophen, bisacodyl, HYDROcodone-acetaminophen,  magnesium citrate, morphine injection, naLOXone (NARCAN)  injection, ondansetron **OR** ondansetron (ZOFRAN) IV, senna-docusate  Subjective: Feels well, no acute complaints Afebrile, no nausea, vomiting, diarrhea. No rash Reports serosanguinous drainage. No odor.  Review of Systems: Review of Systems  Constitutional: Negative for chills and fever.  Gastrointestinal: Negative for diarrhea, nausea and vomiting.  Skin: Negative for rash.       + drainage  All other systems reviewed and are negative.     OBJECTIVE: Blood pressure 105/83, pulse 70, temperature 97.8 F (36.6 C), temperature source Oral, resp. rate 14, height 5\' 5"  (1.651 m), weight 86.2 kg, SpO2 96 %. Body mass index is 31.62 kg/m.  Physical Exam Constitutional:      General: He is not in acute distress.    Appearance: Normal appearance. He is not ill-appearing.  HENT:     Head: Normocephalic.     Comments: Scalp wound with dressing in place without strike through bleeding    Nose: Nose normal.     Mouth/Throat:     Mouth: Mucous membranes are moist.  Eyes:     Extraocular Movements: Extraocular movements intact.     Conjunctiva/sclera: Conjunctivae normal.  Cardiovascular:     Rate and Rhythm: Normal rate and regular rhythm.  Pulmonary:     Effort: Pulmonary effort is normal. No respiratory distress.     Breath sounds: Normal breath sounds.  Abdominal:  General: Abdomen is flat.     Palpations: Abdomen is soft.     Tenderness: There is no abdominal tenderness.  Neurological:     Mental Status: He is alert.       Lab Results: Lab Results  Component Value Date   WBC 8.5 03/21/2020   HGB 11.9 (L) 03/21/2020   HCT 37.2 (L) 03/21/2020   MCV 85.7 03/21/2020   PLT 241 03/21/2020    Lab Results  Component Value Date   CREATININE 0.75 03/23/2020   BUN 9 03/23/2020   NA 138 03/23/2020   K 3.8 03/23/2020   CL 105 03/23/2020   CO2 23 03/23/2020    Lab Results  Component Value Date   ALT 31  03/21/2020   AST 19 03/21/2020   ALKPHOS 76 03/21/2020     Microbiology: Recent Results (from the past 240 hour(s))  SARS Coronavirus 2 by RT PCR (hospital order, performed in [facility] hospital lab) Nasopharyngeal Nasopharyngeal Swab     Status: None   Collection Time: 03/21/20 12:53 PM   Specimen: Nasopharyngeal Swab  Result Value Ref Range Status   SARS Coronavirus 2 NEGATIVE NEGATIVE Final    Comment: (NOTE) SARS-CoV-2 target nucleic acids are NOT DETECTED.  The SARS-CoV-2 RNA is generally detectable in upper and lower respiratory specimens during the acute phase of infection. The lowest concentration of SARS-CoV-2 viral copies this assay can detect is 250 copies / mL. A negative result does not preclude SARS-CoV-2 infection and should not be used as the sole basis for treatment or other patient management decisions.  A negative result may occur with improper specimen collection / handling, submission of specimen other than nasopharyngeal swab, presence of viral mutation(s) within the areas targeted by this assay, and inadequate number of viral copies (<250 copies / mL). A negative result must be combined with clinical observations, patient history, and epidemiological information.  Fact Sheet for Patients:   StrictlyIdeas.no  Fact Sheet for Healthcare Providers: BankingDealers.co.za  This test is not yet approved or  cleared by the [place] FDA and has been authorized for detection and/or diagnosis of SARS-CoV-2 by FDA under an Emergency Use Authorization (EUA).  This EUA will remain in effect (meaning this test can be used) for the duration of the COVID-19 declaration under Section 564(b)(1) of the Act, 21 U.S.C. section 360bbb-3(b)(1), unless the authorization is terminated or revoked sooner.  Performed at [facility], [place] [address], [place], [place] [address]   Aerobic/Anaerobic Culture (surgical/deep  wound)     Status: None (Preliminary result)   Collection Time: 03/21/20  4:40 PM   Specimen: Wound  Result Value Ref Range Status   Specimen Description WOUND  Final   Special Requests CRANIOTOMY INFECTION  Final   Gram Stain   Final    ABUNDANT WBC PRESENT,BOTH PMN AND MONONUCLEAR MODERATE GRAM NEGATIVE RODS Performed at [facility], [place] [address] [place], [place] [address]    Culture   Final    FEW KLEBSIELLA PNEUMONIAE NO ANAEROBES ISOLATED; CULTURE IN PROGRESS FOR 5 DAYS    Report Status PENDING  Incomplete   Organism ID, Bacteria KLEBSIELLA PNEUMONIAE  Final      Susceptibility   Klebsiella pneumoniae - MIC*    AMPICILLIN >=32 RESISTANT Resistant     CEFAZOLIN <=4 SENSITIVE Sensitive     CEFEPIME <=0.12 SENSITIVE Sensitive     CEFTAZIDIME <=1 SENSITIVE Sensitive     CEFTRIAXONE <=0.25 SENSITIVE Sensitive  CIPROFLOXACIN <=0.25 SENSITIVE Sensitive     GENTAMICIN <=1 SENSITIVE Sensitive     IMIPENEM <=0.25 SENSITIVE Sensitive     TRIMETH/SULFA <=20 SENSITIVE Sensitive     AMPICILLIN/SULBACTAM 4 SENSITIVE Sensitive     PIP/TAZO <=4 SENSITIVE Sensitive     * FEW KLEBSIELLA PNEUMONIAE    Raynelle Highland for Infectious Disease [facility] 03/25/2020, 8:38 AM

## 2020-03-26 LAB — GLUCOSE, CAPILLARY
Glucose-Capillary: 189 mg/dL — ABNORMAL HIGH (ref 70–99)
Glucose-Capillary: 278 mg/dL — ABNORMAL HIGH (ref 70–99)
Glucose-Capillary: 253 mg/dL — ABNORMAL HIGH (ref 70–99)
Glucose-Capillary: 236 mg/dL — ABNORMAL HIGH (ref 70–99)

## 2020-03-26 LAB — COMPREHENSIVE METABOLIC PANEL
ALT: 57 U/L — ABNORMAL HIGH (ref 0–44)
AST: 45 U/L — ABNORMAL HIGH (ref 15–41)
Albumin: 2.9 g/dL — ABNORMAL LOW (ref 3.5–5.0)
Alkaline Phosphatase: 77 U/L (ref 38–126)
Anion gap: 9 (ref 5–15)
BUN: 17 mg/dL (ref 6–20)
CO2: 23 mmol/L (ref 22–32)
Calcium: 8.5 mg/dL — ABNORMAL LOW (ref 8.9–10.3)
Chloride: 105 mmol/L (ref 98–111)
Creatinine, Ser: 0.9 mg/dL (ref 0.61–1.24)
GFR calc Af Amer: >60 mL/min (ref >60)
GFR calc non Af Amer: >60 mL/min (ref >60)
Glucose, Bld: 330 mg/dL — ABNORMAL HIGH (ref 70–99)
Potassium: 4.5 mmol/L (ref 3.5–5.1)
Sodium: 137 mmol/L (ref 135–145)
Total Bilirubin: 0.4 mg/dL (ref 0.3–1.2)
Total Protein: 6.5 g/dL (ref 6.5–8.1)

## 2020-03-26 LAB — CBC
HCT: 38 % — ABNORMAL LOW (ref 39.0–52.0)
Hemoglobin: 12.4 g/dL — ABNORMAL LOW (ref 13.0–17.0)
MCH: 27.7 pg (ref 26.0–34.0)
MCHC: 32.6 g/dL (ref 30.0–36.0)
MCV: 84.8 fL (ref 80.0–100.0)
Platelets: 262 10*3/uL (ref 150–400)
RBC: 4.48 MIL/uL (ref 4.22–5.81)
RDW: 12.8 % (ref 11.5–15.5)
WBC: 7.5 10*3/uL (ref 4.0–10.5)
nRBC: 0 % (ref 0.0–0.2)

## 2020-03-26 NOTE — Progress Notes (Addendum)
Inpatient Diabetes Program Recommendations  AACE/ADA: New Consensus Statement on Inpatient Glycemic Control (2015)  Target Ranges:  Prepandial:   less than 140 mg/dL      Peak postprandial:   less than 180 mg/dL (1-2 hours)      Critically ill patients:  140 - 180 mg/dL   Lab Results  Component Value Date   GLUCAP 278 (H) 03/26/2020   HGBA1C 6.8 (H) 03/24/2020    Review of Glycemic Control Results for Harold Torres, Harold Torres (MRN [id number]) as of 03/26/2020 13:05  Ref. Range 03/25/2020 12:01 03/25/2020 17:38 03/25/2020 21:07 03/26/2020 08:13 03/26/2020 11:52  Glucose-Capillary Latest Ref Range: 70 - 99 mg/dL 279 (H) 338 (H) 179 (H) 189 (H) 278 (H)    Inpatient Diabetes Program Recommendations:    Please consider  -Novolog 4 units meal coverage if eats at least 50% of meal -Levemir 14 units qhs  Will continue to follow while inpatient.  Thank you, Reche Dixon, RN, BSN Diabetes Coordinator Inpatient Diabetes Program [phone] (team pager from 8a-5p)

## 2020-03-26 NOTE — Progress Notes (Signed)
[facility] for Infectious Disease  Date of Admission:  03/21/2020   Total days of antibiotics: 6        Current antibiotics:         Day 3 ceftriaxone          Reason for visit: Follow up on cranioplasty wound infection  Interval Events: Afebrile No acute events noted  Active Problems:   Postoperative wound infection   Hep B w/o coma   Assessment: 44 y.o. male with recent neurosurgery cranioplasty on 03/14/20.  He presented about 1 week post operatively with drainage from his scalp wound but no fevers or chills.  S/p OR for debridement and removal of implant on 03/21/20.  His OR cultures have been isolated to pan-sensitive Kleb pna.  No MRSA or other Gram positives isolated.  Patient remains afebrile.  Labs today are pending since he has been on antibiotics for ~ 1 week thus far.  Previously without leukocytosis and does not appear to have a deeper seated infection at this time warranting a prolonged antibiotic course.  However, given the area of involvement I favor a slightly longer duration than a simple soft tissue infection.  Will discuss with NSGY regarding OR findings but tentatively will plan for about 2 weeks of therapy from washout on 9/17.   Problem List and Recommendations:  # Postoperative wound infection with Kleb pneumonia  -- continue ceftriaxone while inpatient -- at discharge will plan for transition to PO ciprofloxacin to complete therapy (Rx discharge order pended).  This will enable the avoidance of a central line and still provides excellent bioavailability.  He is otherwise young and healthy so I feel comfortable treating for a short course with FQ -- wound care  -- glycemic control -- I will tentatively plan for 2 additional weeks of antibiotics as I do not believe there was deeper bone tissue involved and all implanted material was removed.  However, I will discuss with his primary team as well.  I have made him a follow up appt with me in clinic  on Monday, 04/07/20.  I will plan to keep him on treatment until that follow up to ensure he is still doing well.   Scheduled Meds: . docusate sodium  100 mg Oral BID  . heparin injection (subcutaneous)  5,000 Units Subcutaneous Q8H  . insulin aspart  0-15 Units Subcutaneous TID WC  . insulin aspart  0-5 Units Subcutaneous QHS  . insulin detemir  12 Units Subcutaneous q1800   Continuous Infusions: . 0.9 % NaCl with KCl 20 mEq / L 80 mL/hr at 03/24/20 2320  . cefTRIAXone (ROCEPHIN)  IV 2 g (03/26/20 0834)   PRN Meds:.acetaminophen **OR** acetaminophen, bisacodyl, HYDROcodone-acetaminophen, magnesium citrate, morphine injection, naLOXone (NARCAN)  injection, ondansetron **OR** ondansetron (ZOFRAN) IV, senna-docusate  Subjective: He is doing well without acute complaints.  He is curious when they will change his dressing next, however, he is not currently in any pain.  Denies fevers, chills, abx allergy history.   Review of Systems: Review of Systems  Constitutional: Negative for chills and fever.  HENT:       No pain.   Gastrointestinal: Negative for diarrhea, nausea and vomiting.  Skin: Negative for rash.       + drainage  All other systems reviewed and are negative.     OBJECTIVE: Blood pressure 103/74, pulse 72, temperature 98.1 F (36.7 C), temperature source Oral, resp. rate 15, height 5\' 5"  (1.651 m),  weight 86.2 kg, SpO2 95 %. Body mass index is 31.62 kg/m.  Physical Exam Constitutional:      General: He is not in acute distress.    Appearance: Normal appearance. He is not ill-appearing.  HENT:     Head: Normocephalic.     Comments: Scalp wound dressing with dry blood.    Nose: Nose normal.     Mouth/Throat:     Mouth: Mucous membranes are moist.  Eyes:     Extraocular Movements: Extraocular movements intact.     Conjunctiva/sclera: Conjunctivae normal.  Pulmonary:     Effort: Pulmonary effort is normal. No respiratory distress.  Neurological:     Mental  Status: He is alert.       Lab Results: Lab Results  Component Value Date   WBC 8.5 03/21/2020   HGB 11.9 (L) 03/21/2020   HCT 37.2 (L) 03/21/2020   MCV 85.7 03/21/2020   PLT 241 03/21/2020    Lab Results  Component Value Date   CREATININE 0.75 03/23/2020   BUN 9 03/23/2020   NA 138 03/23/2020   K 3.8 03/23/2020   CL 105 03/23/2020   CO2 23 03/23/2020    Lab Results  Component Value Date   ALT 31 03/21/2020   AST 19 03/21/2020   ALKPHOS 76 03/21/2020     Microbiology: Recent Results (from the past 240 hour(s))  SARS Coronavirus 2 by RT PCR (hospital order, performed in [facility] hospital lab) Nasopharyngeal Nasopharyngeal Swab     Status: None   Collection Time: 03/21/20 12:53 PM   Specimen: Nasopharyngeal Swab  Result Value Ref Range Status   SARS Coronavirus 2 NEGATIVE NEGATIVE Final    Comment: (NOTE) SARS-CoV-2 target nucleic acids are NOT DETECTED.  The SARS-CoV-2 RNA is generally detectable in upper and lower respiratory specimens during the acute phase of infection. The lowest concentration of SARS-CoV-2 viral copies this assay can detect is 250 copies / mL. A negative result does not preclude SARS-CoV-2 infection and should not be used as the sole basis for treatment or other patient management decisions.  A negative result may occur with improper specimen collection / handling, submission of specimen other than nasopharyngeal swab, presence of viral mutation(s) within the areas targeted by this assay, and inadequate number of viral copies (<250 copies / mL). A negative result must be combined with clinical observations, patient history, and epidemiological information.  Fact Sheet for Patients:   StrictlyIdeas.no  Fact Sheet for Healthcare Providers: BankingDealers.co.za  This test is not yet approved or  cleared by the [place] FDA and has been authorized for detection and/or diagnosis of  SARS-CoV-2 by FDA under an Emergency Use Authorization (EUA).  This EUA will remain in effect (meaning this test can be used) for the duration of the COVID-19 declaration under Section 564(b)(1) of the Act, 21 U.S.C. section 360bbb-3(b)(1), unless the authorization is terminated or revoked sooner.  Performed at [facility], [place] [address], [place], [place] [address]   Aerobic/Anaerobic Culture (surgical/deep wound)     Status: None (Preliminary result)   Collection Time: 03/21/20  4:40 PM   Specimen: Wound  Result Value Ref Range Status   Specimen Description WOUND  Final   Special Requests CRANIOTOMY INFECTION  Final   Gram Stain   Final    ABUNDANT WBC PRESENT,BOTH PMN AND MONONUCLEAR MODERATE GRAM NEGATIVE RODS Performed at [facility], [place] [address] [place],  [address]    Culture   Final  FEW KLEBSIELLA PNEUMONIAE NO ANAEROBES ISOLATED; CULTURE IN PROGRESS FOR 5 DAYS    Report Status PENDING  Incomplete   Organism ID, Bacteria KLEBSIELLA PNEUMONIAE  Final      Susceptibility   Klebsiella pneumoniae - MIC*    AMPICILLIN >=32 RESISTANT Resistant     CEFAZOLIN <=4 SENSITIVE Sensitive     CEFEPIME <=0.12 SENSITIVE Sensitive     CEFTAZIDIME <=1 SENSITIVE Sensitive     CEFTRIAXONE <=0.25 SENSITIVE Sensitive     CIPROFLOXACIN <=0.25 SENSITIVE Sensitive     GENTAMICIN <=1 SENSITIVE Sensitive     IMIPENEM <=0.25 SENSITIVE Sensitive     TRIMETH/SULFA <=20 SENSITIVE Sensitive     AMPICILLIN/SULBACTAM 4 SENSITIVE Sensitive     PIP/TAZO <=4 SENSITIVE Sensitive     * FEW KLEBSIELLA PNEUMONIAE    [facility] Infect[facility] 03/26/2020, 10:00 AM

## 2020-03-26 NOTE — Progress Notes (Signed)
Patient ID: Harold Torres, male   DOB: 1975-09-29, 44 y.o.   MRN: [id number] BP 116/77 (BP Location: Left Arm)   Pulse 89   Temp 98.7 F (37.1 C) (Oral)   Resp 20   Ht 5\' 5"  (1.651 m)   Wt 86.2 kg   SpO2 94%   BMI 31.62 kg/m  Dressing changed , base is very clean, scalp looks good.  Neurologically intact.  Will have to be in hospital considering location of the defect, and the scalp being open.  One more week of IV antibiotics then will replace peek implant. The implant has been sterilized so it does not represent a bacterial reservoir.

## 2020-03-27 LAB — AEROBIC/ANAEROBIC CULTURE W GRAM STAIN (SURGICAL/DEEP WOUND)

## 2020-03-27 LAB — GLUCOSE, CAPILLARY
Glucose-Capillary: 177 mg/dL — ABNORMAL HIGH (ref 70–99)
Glucose-Capillary: 337 mg/dL — ABNORMAL HIGH (ref 70–99)
Glucose-Capillary: 272 mg/dL — ABNORMAL HIGH (ref 70–99)
Glucose-Capillary: 245 mg/dL — ABNORMAL HIGH (ref 70–99)

## 2020-03-27 NOTE — Progress Notes (Signed)
Inpatient Diabetes Program Recommendations  AACE/ADA: New Consensus Statement on Inpatient Glycemic Control (2015)  Target Ranges:  Prepandial:   less than 140 mg/dL      Peak postprandial:   less than 180 mg/dL (1-2 hours)      Critically ill patients:  140 - 180 mg/dL   Lab Results  Component Value Date   GLUCAP 177 (H) 03/27/2020   HGBA1C 6.8 (H) 03/24/2020    Review of Glycemic Control Results for Harold Torres, Harold Torres (MRN [id number]) as of 03/27/2020 11:05  Ref. Range 03/26/2020 08:13 03/26/2020 11:52 03/26/2020 16:43 03/26/2020 21:14 03/27/2020 08:29  Glucose-Capillary Latest Ref Range: 70 - 99 mg/dL 189 (H) 278 (H) 253 (H) 236 (H) 177 (H)    Inpatient Diabetes Program Recommendations:     Please consider,  Novolog 4 units tid with meals if eats at least 50%  Will continue to follow while inpatient.  Thank you, Reche Dixon, RN, BSN Diabetes Coordinator Inpatient Diabetes Program [phone] (team pager from 8a-5p)

## 2020-03-27 NOTE — Progress Notes (Signed)
[facility] for Infectious Disease  Date of Admission:  03/21/2020   Total days of antibiotics: 7        Current antibiotics:         Day 4 ceftriaxone          Reason for visit: Follow up on cranioplasty wound infection  Interval Events: Afebrile No acute events noted  Active Problems:   Postoperative wound infection   Hep B w/o coma   Assessment: 44 y.o. male with recent neurosurgery cranioplasty on 03/14/20.  He presented about 1 week post operatively with drainage from his scalp wound but no fevers or chills.  S/p OR for debridement and removal of implant on 03/21/20.  His OR cultures have been isolated to pan-sensitive Kleb pna.  No MRSA or other Gram positives isolated.  Patient remains afebrile.  His peek implant was removed and does not represent a bacterial reservoir.  He does not appear to have a deeper seated infection at this time warranting a prolonged antibiotic course.  However, given the area of involvement I favor a slightly longer duration than a simple soft tissue infection.  Will plan for about 2 weeks of therapy from washout on 9/17 with end of therapy ~ 9/30.   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 -- discussed at length with patient this morning. He was prescribed Vemlidy back in May by his GI doctor (Dr Eusebio Friendly with [facility]) but did not start taking the medication.  He reports this was due to cost and he was to start another medication, but he has yet to follow up at this time.  I discussed with him the importance of his HBV treatment and following up as soon as possible after discharge.  Patient states he is comfortable with this plan and has no  barrier to follow up with Dr Cindee Salt  I will sign off for now but please call with any questions/concerns.   Scheduled Meds: . docusate sodium  100 mg Oral BID  . heparin injection (subcutaneous)  5,000 Units Subcutaneous Q8H  . insulin aspart  0-15 Units Subcutaneous TID WC  . insulin aspart  0-5 Units Subcutaneous QHS  . insulin detemir  12 Units Subcutaneous q1800   Continuous Infusions: . 0.9 % NaCl with KCl 20 mEq / L 80 mL/hr at 03/26/20 2211  . cefTRIAXone (ROCEPHIN)  IV 2 g (03/27/20 0849)   PRN Meds:.acetaminophen **OR** acetaminophen, bisacodyl, HYDROcodone-acetaminophen, magnesium citrate, morphine injection, naLOXone (NARCAN)  injection, ondansetron **OR** ondansetron (ZOFRAN) IV, senna-docusate  Subjective: He feels the same as yesterday. No fevers, chills.  No n/v/d.  No pain.   Review of Systems: Review of Systems  Constitutional: Negative for chills and fever.  HENT:       No pain.   Gastrointestinal: Negative for abdominal pain, diarrhea, nausea and vomiting.  Skin: Negative for rash.       + drainage  Psychiatric/Behavioral: Negative for depression.  All other systems reviewed and are negative.     OBJECTIVE: Blood pressure 109/78, pulse 75, temperature 98.1 F (36.7 C), temperature source Oral, resp. rate 16, height 5\' 5"  (1.651 m), weight 86.2 kg, SpO2 95 %.  Body mass index is 31.62 kg/m.  Physical Exam Constitutional:      General: He is not in acute distress.    Appearance: Normal appearance. He is not ill-appearing.  HENT:     Head: Normocephalic.     Comments: Scalp wound dressed    Nose: Nose normal.  Eyes:     General: No scleral icterus.    Extraocular Movements: Extraocular movements intact.     Conjunctiva/sclera: Conjunctivae normal.  Pulmonary:     Effort: Pulmonary effort is normal. No respiratory distress.  Musculoskeletal:     Cervical back: Normal range of motion and neck supple.  Neurological:     Mental Status: He is alert.        Lab Results: Lab Results  Component Value Date   WBC 7.5 03/26/2020   HGB 12.4 (L) 03/26/2020   HCT 38.0 (L) 03/26/2020   MCV 84.8 03/26/2020   PLT 262 03/26/2020    Lab Results  Component Value Date   CREATININE 0.90 03/26/2020   BUN 17 03/26/2020   NA 137 03/26/2020   K 4.5 03/26/2020   CL 105 03/26/2020   CO2 23 03/26/2020    Lab Results  Component Value Date   ALT 57 (H) 03/26/2020   AST 45 (H) 03/26/2020   ALKPHOS 77 03/26/2020     Microbiology: Recent Results (from the past 240 hour(s))  SARS Coronavirus 2 by RT PCR (hospital order, performed in [facility] hospital lab) Nasopharyngeal Nasopharyngeal Swab     Status: None   Collection Time: 03/21/20 12:53 PM   Specimen: Nasopharyngeal Swab  Result Value Ref Range Status   SARS Coronavirus 2 NEGATIVE NEGATIVE Final    Comment: (NOTE) SARS-CoV-2 target nucleic acids are NOT DETECTED.  The SARS-CoV-2 RNA is generally detectable in upper and lower respiratory specimens during the acute phase of infection. The lowest concentration of SARS-CoV-2 viral copies this assay can detect is 250 copies / mL. A negative result does not preclude SARS-CoV-2 infection and should not be used as the sole basis for treatment or other patient management decisions.  A negative result may occur with improper specimen collection / handling, submission of specimen other than nasopharyngeal swab, presence of viral mutation(s) within the areas targeted by this assay, and inadequate number of viral copies (<250 copies / mL). A negative result must be combined with clinical observations, patient history, and epidemiological information.  Fact Sheet for Patients:   StrictlyIdeas.no  Fact Sheet for Healthcare Providers: BankingDealers.co.za  This test is not yet approved or  cleared by the [place] FDA and has been authorized for detection and/or diagnosis of SARS-CoV-2 by FDA  under an Emergency Use Authorization (EUA).  This EUA will remain in effect (meaning this test can be used) for the duration of the COVID-19 declaration under Section 564(b)(1) of the Act, 21 U.S.C. section 360bbb-3(b)(1), unless the authorization is terminated or revoked sooner.  Performed at [facility], [place] [address], [place], [place] [address]   Aerobic/Anaerobic Culture (surgical/deep wound)     Status: None (Preliminary result)   Collection Time: 03/21/20  4:40 PM   Specimen: Wound  Result Value Ref Range Status   Specimen Description WOUND  Final   Special Requests CRANIOTOMY INFECTION  Final   Gram Stain   Final    ABUNDANT WBC PRESENT,BOTH PMN AND MONONUCLEAR MODERATE GRAM NEGATIVE RODS    Culture   Final    FEW KLEBSIELLA PNEUMONIAE NO ANAEROBES ISOLATED Performed at [facility], [place] [address], [place], [place] [address]    Report Status PENDING  Incomplete   Organism ID, Bacteria KLEBSIELLA PNEUMONIAE  Final      Susceptibility   Klebsiella pneumoniae - MIC*    AMPICILLIN >=32 RESISTANT Resistant     CEFAZOLIN <=4 SENSITIVE Sensitive     CEFEPIME <=0.12 SENSITIVE Sensitive     CEFTAZIDIME <=1 SENSITIVE Sensitive     CEFTRIAXONE <=0.25 SENSITIVE Sensitive     CIPROFLOXACIN <=0.25 SENSITIVE Sensitive     GENTAMICIN <=1 SENSITIVE Sensitive     IMIPENEM <=0.25 SENSITIVE Sensitive     TRIMETH/SULFA <=20 SENSITIVE Sensitive     AMPICILLIN/SULBACTAM 4 SENSITIVE Sensitive     PIP/TAZO <=4 SENSITIVE Sensitive     * FEW KLEBSIELLA PNEUMONIAE    Raynelle Highland for Infectious Disease [facility] 03/27/2020, 10:33 AM

## 2020-03-27 NOTE — Progress Notes (Signed)
Patient ID: Harold Torres, male   DOB: 05-06-1976, 44 y.o.   MRN: [id number] BP 100/71 (BP Location: Left Arm)   Pulse 90   Temp 98.7 F (37.1 C) (Oral)   Resp 19   Ht 5\' 5"  (1.651 m)   Wt 86.2 kg   SpO2 98%   BMI 31.62 kg/m  Alert and oriented x 4 Speech is clear and fluent Dressing is dry Will continue abx and complete the two weeks.

## 2020-03-28 LAB — GLUCOSE, CAPILLARY
Glucose-Capillary: 150 mg/dL — ABNORMAL HIGH (ref 70–99)
Glucose-Capillary: 306 mg/dL — ABNORMAL HIGH (ref 70–99)
Glucose-Capillary: 138 mg/dL — ABNORMAL HIGH (ref 70–99)
Glucose-Capillary: 180 mg/dL — ABNORMAL HIGH (ref 70–99)

## 2020-03-28 MED ORDER — INSULIN ASPART 100 UNIT/ML ~~LOC~~ SOLN
4.0000 [IU] | Freq: Three times a day (TID) | SUBCUTANEOUS | Status: DC
  Administered 2020-03-28 – 2020-04-07 (×27): 4 [IU] via SUBCUTANEOUS

## 2020-03-28 NOTE — Progress Notes (Signed)
Inpatient Diabetes Program Recommendations  AACE/ADA: New Consensus Statement on Inpatient Glycemic Control (2015)  Target Ranges:  Prepandial:   less than 140 mg/dL      Peak postprandial:   less than 180 mg/dL (1-2 hours)      Critically ill patients:  140 - 180 mg/dL   Lab Results  Component Value Date   GLUCAP 306 (H) 03/28/2020   HGBA1C 6.8 (H) 03/24/2020    Review of Glycemic Control Results for Harold Torres, Harold Torres (MRN [id number]) as of 03/28/2020 13:00  Ref. Range 03/27/2020 11:52 03/27/2020 16:34 03/27/2020 21:18 03/28/2020 08:12 03/28/2020 11:51  Glucose-Capillary Latest Ref Range: 70 - 99 mg/dL 337 (H) 272 (H) 245 (H) 150 (H) 306 (H)     Inpatient Diabetes Program Recommendations:     Postprandials elevated,   Novolog 4 units tid with meals if eats at least 50% of meal  Will continue to follow while inpatient.  Thank you, Reche Dixon, RN, BSN Diabetes Coordinator Inpatient Diabetes Program [phone] (team pager from 8a-5p)

## 2020-03-28 NOTE — Progress Notes (Signed)
Patient ID: Harold Torres, male   DOB: [birth year], 44 y.o.   MRN: [id number] BP 107/74 (BP Location: Left Arm)   Pulse 83   Temp 98 F (36.7 C) (Oral)   Resp 15   Ht 5\' 5"  (1.651 m)   Wt 86.2 kg   SpO2 94%   BMI 31.62 kg/m  Alert and oriented x 4, speech is clear and fluent Moving all extremities well Wound is clean, dry, no signs of infection Continue abx for another week.

## 2020-03-29 LAB — GLUCOSE, CAPILLARY
Glucose-Capillary: 142 mg/dL — ABNORMAL HIGH (ref 70–99)
Glucose-Capillary: 204 mg/dL — ABNORMAL HIGH (ref 70–99)
Glucose-Capillary: 139 mg/dL — ABNORMAL HIGH (ref 70–99)
Glucose-Capillary: 227 mg/dL — ABNORMAL HIGH (ref 70–99)

## 2020-03-29 NOTE — Progress Notes (Signed)
Subjective: Patient reports no headache  Objective: Vital signs in last 24 hours: Temp:  [97.5 F (36.4 C)-98.6 F (37 C)] 97.5 F (36.4 C) (09/25 0754) Pulse Rate:  [76-84] 84 (09/25 0754) Resp:  [15-19] 16 (09/25 0754) BP: (98-113)/(71-79) 112/77 (09/25 0754) SpO2:  [93 %-98 %] 95 % (09/25 0754)  Intake/Output from previous day: 09/24 0701 - 09/25 0700 In: 240 [P.O.:240] Out: 1200 [Urine:1200] Intake/Output this shift: No intake/output data recorded.  Gauze dressing in wound noted,  No active drainage.  Granulation tissue along edge of wound seen.   Lab Results: Recent Labs    03/26/20 1230  WBC 7.5  HGB 12.4*  HCT 38.0*  PLT 262   BMET Recent Labs    03/26/20 1230  NA 137  K 4.5  CL 105  CO2 23  GLUCOSE 330*  BUN 17  CREATININE 0.90  CALCIUM 8.5*    Studies/Results: No results found.  Assessment/Plan: S/p cranial wound washout - Dr. Christella Noa to perform dressing change tomorrow -continue atbx  Vallarie Mare 03/29/2020, 11:30 AM

## 2020-03-30 LAB — GLUCOSE, CAPILLARY
Glucose-Capillary: 138 mg/dL — ABNORMAL HIGH (ref 70–99)
Glucose-Capillary: 234 mg/dL — ABNORMAL HIGH (ref 70–99)
Glucose-Capillary: 149 mg/dL — ABNORMAL HIGH (ref 70–99)
Glucose-Capillary: 282 mg/dL — ABNORMAL HIGH (ref 70–99)

## 2020-03-30 NOTE — Progress Notes (Signed)
Subjective: Patient reports no headaches  Objective: Vital signs in last 24 hours: Temp:  [97.9 F (36.6 C)-98.5 F (36.9 C)] 98.3 F (36.8 C) (09/26 0719) Pulse Rate:  [68-89] 68 (09/26 0719) Resp:  [13-20] 14 (09/26 0719) BP: (99-117)/(65-77) 99/65 (09/26 0719) SpO2:  [93 %-96 %] 95 % (09/26 0719)  Intake/Output from previous day: 09/25 0701 - 09/26 0700 In: 600 [P.O.:600] Out: -  Intake/Output this shift: No intake/output data recorded.  Awake, alert, FC x 4 Gauze dressing in wound noted,  No active drainage.  Granulation tissue along edge of wound seen.  Lab Results: No results for input(s): WBC, HGB, HCT, PLT in the last 72 hours. BMET No results for input(s): NA, K, CL, CO2, GLUCOSE, BUN, CREATININE, CALCIUM in the last 72 hours.  Studies/Results: No results found.  Assessment/Plan: S/p cranial wound washout and explant - cont antibiotics - dressing to be changed by Dr. Noah Delaine 03/30/2020, 10:43 AM

## 2020-03-31 LAB — GLUCOSE, CAPILLARY
Glucose-Capillary: 160 mg/dL — ABNORMAL HIGH (ref 70–99)
Glucose-Capillary: 249 mg/dL — ABNORMAL HIGH (ref 70–99)
Glucose-Capillary: 266 mg/dL — ABNORMAL HIGH (ref 70–99)
Glucose-Capillary: 218 mg/dL — ABNORMAL HIGH (ref 70–99)
Glucose-Capillary: 182 mg/dL — ABNORMAL HIGH (ref 70–99)

## 2020-03-31 NOTE — Progress Notes (Signed)
Patient ID: Harold Torres, male   DOB: [birth year], 44 y.o.   MRN: [id number] BP 118/70 (BP Location: Right Arm)   Pulse 89   Temp 98 F (36.7 C) (Oral)   Resp 19   Ht 5\' 5"  (1.651 m)   Wt 86.2 kg   SpO2 94%   BMI 31.62 kg/m  Alert and oriented x 4, speech is clear and fluent Moving all extremities well Wound is clean, dry, no signs of infection Cranioplasty for friday

## 2020-04-01 ENCOUNTER — Other Ambulatory Visit: Payer: Self-pay | Admitting: Neurosurgery

## 2020-04-01 LAB — GLUCOSE, CAPILLARY
Glucose-Capillary: 143 mg/dL — ABNORMAL HIGH (ref 70–99)
Glucose-Capillary: 189 mg/dL — ABNORMAL HIGH (ref 70–99)
Glucose-Capillary: 185 mg/dL — ABNORMAL HIGH (ref 70–99)
Glucose-Capillary: 172 mg/dL — ABNORMAL HIGH (ref 70–99)

## 2020-04-01 NOTE — Progress Notes (Signed)
Patient ID: Harold Torres, male   DOB: 1976/05/31, 44 y.o.   MRN: [id number] BP 108/71 (BP Location: Right Arm)   Pulse 80   Temp 98.1 F (36.7 C) (Oral)   Resp 20   Ht 5\' 5"  (1.651 m)   Wt 86.2 kg   SpO2 91%   BMI 31.62 kg/m  Alert and oriented x 4, speech is clear and fluent. Moving all extremities well Dressing change today, wound looks good. Friday is the plan for cranioplasty.

## 2020-04-02 LAB — GLUCOSE, CAPILLARY
Glucose-Capillary: 149 mg/dL — ABNORMAL HIGH (ref 70–99)
Glucose-Capillary: 199 mg/dL — ABNORMAL HIGH (ref 70–99)
Glucose-Capillary: 172 mg/dL — ABNORMAL HIGH (ref 70–99)
Glucose-Capillary: 165 mg/dL — ABNORMAL HIGH (ref 70–99)

## 2020-04-02 NOTE — Progress Notes (Signed)
Patient ID: Harold Torres, male   DOB: 03-07-1976, 44 y.o.   MRN: [id number] BP 110/72 (BP Location: Right Arm)   Pulse 88   Temp 98.2 F (36.8 C) (Oral)   Resp 17   Ht 5\' 5"  (1.651 m)   Wt 86.2 kg   SpO2 90%   BMI 31.62 kg/m  Alert and oriented x 4, speech is clear and fluent. Perrl, full eom Symmetric facies, tongue and uvula midline Moving all extremities Wound is clean, dry Doing well

## 2020-04-03 ENCOUNTER — Encounter (HOSPITAL_COMMUNITY): Payer: Self-pay | Admitting: Neurosurgery

## 2020-04-03 LAB — GLUCOSE, CAPILLARY
Glucose-Capillary: 125 mg/dL — ABNORMAL HIGH (ref 70–99)
Glucose-Capillary: 279 mg/dL — ABNORMAL HIGH (ref 70–99)
Glucose-Capillary: 151 mg/dL — ABNORMAL HIGH (ref 70–99)
Glucose-Capillary: 181 mg/dL — ABNORMAL HIGH (ref 70–99)

## 2020-04-03 LAB — TYPE AND SCREEN
ABO/RH(D): B POS
Antibody Screen: NEGATIVE

## 2020-04-03 MED ORDER — MUPIROCIN 2 % EX OINT
1.0000 "application " | TOPICAL_OINTMENT | Freq: Two times a day (BID) | CUTANEOUS | Status: DC
  Administered 2020-04-03 – 2020-04-07 (×9): 1 via NASAL
  Filled 2020-04-03 (×2): qty 22

## 2020-04-03 NOTE — Progress Notes (Signed)
Patient ID: Harold Torres, male   DOB: 1975/08/21, 44 y.o.   MRN: [id number] BP 109/72 (BP Location: Right Arm)   Pulse 81   Temp 98.2 F (36.8 C) (Oral)   Resp 19   Ht 5\' 5"  (1.651 m)   Wt 86.2 kg   SpO2 95%   BMI 31.62 kg/m  Alert and oriented x 4. Speech is clear and fluent Perrl, full eom Tongue and uvula midline Wound is clean, dry. No signs of infection.  Treatment for wound infection will be finished today. OR tomorrow for reimplantation of peek graft.

## 2020-04-04 ENCOUNTER — Inpatient Hospital Stay (HOSPITAL_COMMUNITY): Payer: 59 | Admitting: Certified Registered Nurse Anesthetist

## 2020-04-04 ENCOUNTER — Encounter (HOSPITAL_COMMUNITY)
Admission: AD | Disposition: A | Discharge status: Home / Self Care | Payer: Self-pay | Source: Ambulatory Visit | Attending: Neurosurgery

## 2020-04-04 ENCOUNTER — Encounter (HOSPITAL_COMMUNITY): Payer: Self-pay | Admitting: Neurosurgery

## 2020-04-04 HISTORY — PX: CRANIOPLASTY: SHX1407

## 2020-04-04 LAB — GLUCOSE, CAPILLARY
Glucose-Capillary: 179 mg/dL — ABNORMAL HIGH (ref 70–99)
Glucose-Capillary: 144 mg/dL — ABNORMAL HIGH (ref 70–99)
Glucose-Capillary: 142 mg/dL — ABNORMAL HIGH (ref 70–99)
Glucose-Capillary: 186 mg/dL — ABNORMAL HIGH (ref 70–99)
Glucose-Capillary: 173 mg/dL — ABNORMAL HIGH (ref 70–99)

## 2020-04-04 SURGERY — CRANIOPLASTY
Anesthesia: General

## 2020-04-04 MED ORDER — SODIUM CHLORIDE 0.9 % IV SOLN: INTRAVENOUS | Status: DC | PRN

## 2020-04-04 MED ORDER — FENTANYL CITRATE (PF) 250 MCG/5ML IJ SOLN
INTRAMUSCULAR | Status: AC
  Filled 2020-04-04: qty 5

## 2020-04-04 MED ORDER — MIDAZOLAM HCL 2 MG/2ML IJ SOLN
INTRAMUSCULAR | Status: AC
  Filled 2020-04-04: qty 2

## 2020-04-04 MED ORDER — THROMBIN 20000 UNITS EX SOLR
CUTANEOUS | Status: AC
  Filled 2020-04-04: qty 20000

## 2020-04-04 MED ORDER — FENTANYL CITRATE (PF) 250 MCG/5ML IJ SOLN
INTRAMUSCULAR | Status: DC | PRN
Start: 2020-04-04 — End: 2020-04-04
  Administered 2020-04-04: 50 ug via INTRAVENOUS
  Administered 2020-04-04: 100 ug via INTRAVENOUS

## 2020-04-04 MED ORDER — PROPOFOL 10 MG/ML IV BOLUS
INTRAVENOUS | Status: DC | PRN
  Administered 2020-04-04: 150 mg via INTRAVENOUS

## 2020-04-04 MED ORDER — OXYCODONE HCL 5 MG/5ML PO SOLN: 5.0000 mg | Freq: Once | ORAL | Status: DC | PRN

## 2020-04-04 MED ORDER — KETOROLAC TROMETHAMINE 30 MG/ML IJ SOLN
30.0000 mg | Freq: Once | INTRAMUSCULAR | Status: AC
  Administered 2020-04-04: 30 mg via INTRAVENOUS

## 2020-04-04 MED ORDER — KETOROLAC TROMETHAMINE 30 MG/ML IJ SOLN
INTRAMUSCULAR | Status: AC
  Filled 2020-04-04: qty 1

## 2020-04-04 MED ORDER — OXYCODONE HCL 5 MG PO TABS
ORAL_TABLET | ORAL | Status: AC
  Filled 2020-04-04: qty 1

## 2020-04-04 MED ORDER — LIDOCAINE 2% (20 MG/ML) 5 ML SYRINGE
INTRAMUSCULAR | Status: DC | PRN
  Administered 2020-04-04: 50 mg via INTRAVENOUS

## 2020-04-04 MED ORDER — FENTANYL CITRATE (PF) 100 MCG/2ML IJ SOLN
INTRAMUSCULAR | Status: AC
Start: 2020-04-04 — End: 2020-04-04
  Filled 2020-04-04: qty 2

## 2020-04-04 MED ORDER — ROCURONIUM BROMIDE 10 MG/ML (PF) SYRINGE
PREFILLED_SYRINGE | INTRAVENOUS | Status: DC | PRN
  Administered 2020-04-04: 50 mg via INTRAVENOUS

## 2020-04-04 MED ORDER — DEXTROSE 5 % IV SOLN
INTRAVENOUS | Status: DC | PRN
  Administered 2020-04-04: 2 g via INTRAVENOUS

## 2020-04-04 MED ORDER — POTASSIUM CHLORIDE IN NACL 20-0.9 MEQ/L-% IV SOLN
INTRAVENOUS | Status: AC
  Filled 2020-04-04 (×3): qty 1000

## 2020-04-04 MED ORDER — LIDOCAINE-EPINEPHRINE 0.5 %-1:200000 IJ SOLN
INTRAMUSCULAR | Status: AC
  Filled 2020-04-04: qty 1

## 2020-04-04 MED ORDER — CHLORHEXIDINE GLUCONATE 0.12 % MT SOLN
OROMUCOSAL | Status: AC
  Administered 2020-04-04: 15 mL
  Filled 2020-04-04: qty 15

## 2020-04-04 MED ORDER — CEFAZOLIN SODIUM-DEXTROSE 2-4 GM/100ML-% IV SOLN
2.0000 g | INTRAVENOUS | Status: AC
  Administered 2020-04-04: 2 g via INTRAVENOUS
  Filled 2020-04-04: qty 100

## 2020-04-04 MED ORDER — PROPOFOL 10 MG/ML IV BOLUS
INTRAVENOUS | Status: AC
  Filled 2020-04-04: qty 20

## 2020-04-04 MED ORDER — PHENYLEPHRINE 40 MCG/ML (10ML) SYRINGE FOR IV PUSH (FOR BLOOD PRESSURE SUPPORT)
PREFILLED_SYRINGE | INTRAVENOUS | Status: DC | PRN
  Administered 2020-04-04 (×2): 160 ug via INTRAVENOUS
  Administered 2020-04-04: 120 ug via INTRAVENOUS
  Administered 2020-04-04: 80 ug via INTRAVENOUS

## 2020-04-04 MED ORDER — ALBUMIN HUMAN 5 % IV SOLN: INTRAVENOUS | Status: DC | PRN

## 2020-04-04 MED ORDER — 0.9 % SODIUM CHLORIDE (POUR BTL) OPTIME
TOPICAL | Status: DC | PRN
  Administered 2020-04-04 (×2): 1000 mL

## 2020-04-04 MED ORDER — ONDANSETRON HCL 4 MG/2ML IJ SOLN: 4.0000 mg | Freq: Four times a day (QID) | INTRAMUSCULAR | Status: DC | PRN

## 2020-04-04 MED ORDER — ONDANSETRON HCL 4 MG/2ML IJ SOLN
INTRAMUSCULAR | Status: AC
  Filled 2020-04-04: qty 2

## 2020-04-04 MED ORDER — BACITRACIN ZINC 500 UNIT/GM EX OINT
TOPICAL_OINTMENT | CUTANEOUS | Status: AC
  Filled 2020-04-04: qty 28.35

## 2020-04-04 MED ORDER — CHLORHEXIDINE GLUCONATE CLOTH 2 % EX PADS
6.0000 | MEDICATED_PAD | Freq: Once | CUTANEOUS | Status: AC
  Administered 2020-04-04: 6 via TOPICAL

## 2020-04-04 MED ORDER — LIDOCAINE 2% (20 MG/ML) 5 ML SYRINGE
INTRAMUSCULAR | Status: AC
  Filled 2020-04-04: qty 5

## 2020-04-04 MED ORDER — PHENYLEPHRINE 40 MCG/ML (10ML) SYRINGE FOR IV PUSH (FOR BLOOD PRESSURE SUPPORT)
PREFILLED_SYRINGE | INTRAVENOUS | Status: AC
  Filled 2020-04-04: qty 20

## 2020-04-04 MED ORDER — DEXAMETHASONE SODIUM PHOSPHATE 10 MG/ML IJ SOLN
INTRAMUSCULAR | Status: AC
  Filled 2020-04-04: qty 1

## 2020-04-04 MED ORDER — SUGAMMADEX SODIUM 200 MG/2ML IV SOLN
INTRAVENOUS | Status: DC | PRN
  Administered 2020-04-04: 200 mg via INTRAVENOUS

## 2020-04-04 MED ORDER — LACTATED RINGERS IV SOLN: INTRAVENOUS | Status: DC | PRN

## 2020-04-04 MED ORDER — FENTANYL CITRATE (PF) 100 MCG/2ML IJ SOLN
25.0000 ug | INTRAMUSCULAR | Status: DC | PRN
  Administered 2020-04-04: 50 ug via INTRAVENOUS

## 2020-04-04 MED ORDER — BACITRACIN ZINC 500 UNIT/GM EX OINT
TOPICAL_OINTMENT | CUTANEOUS | Status: DC | PRN
  Administered 2020-04-04: 1 via TOPICAL

## 2020-04-04 MED ORDER — FENTANYL CITRATE (PF) 100 MCG/2ML IJ SOLN: 25.0000 ug | INTRAMUSCULAR | Status: DC | PRN

## 2020-04-04 MED ORDER — CEFAZOLIN SODIUM 1 G IJ SOLR
INTRAMUSCULAR | Status: AC
  Filled 2020-04-04: qty 20

## 2020-04-04 MED ORDER — THROMBIN 20000 UNITS EX SOLR
CUTANEOUS | Status: DC | PRN
  Administered 2020-04-04: 20 mL via TOPICAL

## 2020-04-04 MED ORDER — ROCURONIUM BROMIDE 10 MG/ML (PF) SYRINGE
PREFILLED_SYRINGE | INTRAVENOUS | Status: AC
  Filled 2020-04-04: qty 30

## 2020-04-04 MED ORDER — OXYCODONE HCL 5 MG PO TABS: 5.0000 mg | ORAL_TABLET | Freq: Once | ORAL | Status: DC | PRN

## 2020-04-04 MED ORDER — DEXAMETHASONE SODIUM PHOSPHATE 10 MG/ML IJ SOLN
INTRAMUSCULAR | Status: DC | PRN
  Administered 2020-04-04: 10 mg via INTRAVENOUS

## 2020-04-04 MED ORDER — OXYCODONE HCL 5 MG PO TABS
5.0000 mg | ORAL_TABLET | Freq: Once | ORAL | Status: AC
  Administered 2020-04-04: 5 mg via ORAL

## 2020-04-04 SURGICAL SUPPLY — 56 items
ADH SKN CLS APL DERMABOND .7 (GAUZE/BANDAGES/DRESSINGS)
BLADE CLIPPER SURG (BLADE) IMPLANT
BNDG CMPR 75X41 PLY HI ABS (GAUZE/BANDAGES/DRESSINGS)
BNDG STRETCH 4X75 STRL LF (GAUZE/BANDAGES/DRESSINGS) IMPLANT
CANISTER SUCT 3000ML PPV (MISCELLANEOUS) ×3 IMPLANT
CARTRIDGE OIL MAESTRO DRILL (MISCELLANEOUS) IMPLANT
CLIP RANEY DISP (INSTRUMENTS) IMPLANT
COVER BACK TABLE 60X90IN (DRAPES) IMPLANT
COVER WAND RF STERILE (DRAPES) ×3 IMPLANT
DECANTER SPIKE VIAL GLASS SM (MISCELLANEOUS) ×3 IMPLANT
DERMABOND ADVANCED (GAUZE/BANDAGES/DRESSINGS)
DERMABOND ADVANCED .7 DNX12 (GAUZE/BANDAGES/DRESSINGS) IMPLANT
DIFFUSER DRILL AIR PNEUMATIC (MISCELLANEOUS) ×3 IMPLANT
DRAPE NEUROLOGICAL W/INCISE (DRAPES) ×3 IMPLANT
DRAPE WARM FLUID 44X44 (DRAPES) ×3 IMPLANT
DRSG TELFA 3X8 NADH (GAUZE/BANDAGES/DRESSINGS) ×3 IMPLANT
DURAPREP 6ML APPLICATOR 50/CS (WOUND CARE) IMPLANT
ELECT REM PT RETURN 9FT ADLT (ELECTROSURGICAL) ×3
ELECTRODE REM PT RTRN 9FT ADLT (ELECTROSURGICAL) ×1 IMPLANT
EVACUATOR 1/8 PVC DRAIN (DRAIN) ×3 IMPLANT
GAUZE 4X4 16PLY RFD (DISPOSABLE) IMPLANT
GAUZE SPONGE 4X4 12PLY STRL (GAUZE/BANDAGES/DRESSINGS) ×3 IMPLANT
GLOVE ECLIPSE 6.5 STRL STRAW (GLOVE) ×3 IMPLANT
GLOVE EXAM NITRILE XL STR (GLOVE) IMPLANT
GOWN STRL REUS W/ TWL LRG LVL3 (GOWN DISPOSABLE) ×2 IMPLANT
GOWN STRL REUS W/ TWL XL LVL3 (GOWN DISPOSABLE) ×2 IMPLANT
GOWN STRL REUS W/TWL 2XL LVL3 (GOWN DISPOSABLE) IMPLANT
GOWN STRL REUS W/TWL LRG LVL3 (GOWN DISPOSABLE) ×6
GOWN STRL REUS W/TWL XL LVL3 (GOWN DISPOSABLE) ×6
HEMOSTAT SURGICEL 2X14 (HEMOSTASIS) IMPLANT
IMPL PEEK CUSTOM CRANIAL (Neurostimulator) ×1 IMPLANT
IMPLANT PEEK CUSTOM CRANIAL (Neurostimulator) ×3 IMPLANT
KIT BASIN OR (CUSTOM PROCEDURE TRAY) ×3 IMPLANT
KIT TURNOVER KIT B (KITS) ×3 IMPLANT
NEEDLE HYPO 25X1 1.5 SAFETY (NEEDLE) ×3 IMPLANT
NS IRRIG 1000ML POUR BTL (IV SOLUTION) ×12 IMPLANT
OIL CARTRIDGE MAESTRO DRILL (MISCELLANEOUS)
PACK CRANIOTOMY CUSTOM (CUSTOM PROCEDURE TRAY) ×3 IMPLANT
PAD ARMBOARD 7.5X6 YLW CONV (MISCELLANEOUS) IMPLANT
PIN MAYFIELD SKULL DISP (PIN) IMPLANT
PLATE BONE 12 2H TARGET XL (Plate) ×3 IMPLANT
PLATE UNIV CMF 16 2H (Plate) ×12 IMPLANT
SCREW UNIII AXS SD 1.5X4 (Screw) ×30 IMPLANT
SET CRAINOPLASTY (SET/KITS/TRAYS/PACK) IMPLANT
SPONGE SURGIFOAM ABS GEL 100 (HEMOSTASIS) IMPLANT
STAPLER SKIN PROX WIDE 3.9 (STAPLE) ×3 IMPLANT
SUT ETHILON 2 0 PSLX (SUTURE) ×15 IMPLANT
SUT ETHILON 3 0 FSL (SUTURE) IMPLANT
SUT ETHILON O TP 1 (SUTURE) ×3 IMPLANT
SUT NURALON 4 0 TR CR/8 (SUTURE) IMPLANT
SUT VIC AB 2-0 CT2 18 VCP726D (SUTURE) ×3 IMPLANT
TOWEL GREEN STERILE (TOWEL DISPOSABLE) ×3 IMPLANT
TOWEL GREEN STERILE FF (TOWEL DISPOSABLE) ×3 IMPLANT
TRAY FOLEY MTR SLVR 16FR STAT (SET/KITS/TRAYS/PACK) IMPLANT
UNDERPAD 30X36 HEAVY ABSORB (UNDERPADS AND DIAPERS) IMPLANT
WATER STERILE IRR 1000ML POUR (IV SOLUTION) ×3 IMPLANT

## 2020-04-04 NOTE — Progress Notes (Signed)
Patient ID: Harold Torres, male   DOB: [birth year], 44 y.o.   MRN: [id number] .BP 109/74 (BP Location: Left Arm)   Pulse 81   Temp (!) 97.5 F (36.4 C) (Axillary)   Resp 13   Ht 5\' 5"  (1.651 m)   Wt 86.2 kg   SpO2 94%   BMI 31.62 kg/m  Alert and oriented x 4, speech is clear and fluent Moving all extremities well Wound is clean, dry, without signs of infection OR today

## 2020-04-04 NOTE — Anesthesia Procedure Notes (Signed)
Procedure Name: Intubation Date/Time: 04/04/2020 2:26 PM Performed by: Bryson Corona, CRNA Pre-anesthesia Checklist: Patient identified, Emergency Drugs available, Suction available and Patient being monitored Patient Re-evaluated:Patient Re-evaluated prior to induction Oxygen Delivery Method: Circle System Utilized Preoxygenation: Pre-oxygenation with 100% oxygen Induction Type: IV induction Ventilation: Mask ventilation without difficulty Laryngoscope Size: Mac and 3 Grade View: Grade I Tube type: Oral Tube size: 7.0 mm Number of attempts: 1 Airway Equipment and Method: Stylet and Oral airway Placement Confirmation: ETT inserted through vocal cords under direct vision,  positive ETCO2 and breath sounds checked- equal and bilateral Secured at: 22 cm Tube secured with: Tape Dental Injury: Teeth and Oropharynx as per pre-operative assessment

## 2020-04-04 NOTE — Transfer of Care (Signed)
Immediate Anesthesia Transfer of Care Note  Patient: Harold Torres  Procedure(s) Performed: Replacement of prosthetic bone flap (N/A )  Patient Location: PACU  Anesthesia Type:General  Level of Consciousness: drowsy and patient cooperative  Airway & Oxygen Therapy: Patient Spontanous Breathing  Post-op Assessment: Report given to RN and Post -op Vital signs reviewed and stable  Post vital signs: Reviewed and stable  Last Vitals:  Vitals Value Taken Time  BP 111/74 04/04/20 1654  Temp    Pulse 91 04/04/20 1655  Resp 16 04/04/20 1655  SpO2 95 % 04/04/20 1655  Vitals shown include unvalidated device data.  Last Pain:  Vitals:   04/04/20 1200  TempSrc: Axillary  PainSc:       Patients Stated Pain Goal: 0 (00/71/21 9758)  Complications: No complications documented.

## 2020-04-04 NOTE — Consult Note (Signed)
Saw patient as Intra-Op consult for scalp wound.  This was a patient I initially saw for a scalp mass and obtained a CT scan which revealed a mass with intracranial extension.  I then referred him to Dr. Christella Noa for neurosurgical resection of the mass which was performed.  The cranial defect was reconstructed with the peek implant and then closed primarily.  The patient then developed a gram-negative rod infection and the peek implant was removed temporarily and resterilized and then replaced today.  The wound closure was very tight and I was asked to evaluate.  Dr. Christella Noa was able to obtain a primary closure and after discussion we agreed to monitor the wound at this point and if it were to breakdown patient would probably need a flap reconstruction for the soft tissues.  At the same time we would likely need to resterilized the peek implant to avoid recurrent infections if that were to recur.  We will plan to be available should that be needed.

## 2020-04-04 NOTE — Anesthesia Preprocedure Evaluation (Signed)
Anesthesia Evaluation  Patient identified by MRN, date of birth, ID band Patient awake    Reviewed: Allergy & Precautions, H&P , NPO status , Patient's Chart, lab work & pertinent test results  Airway Mallampati: II   Neck ROM: full    Dental   Pulmonary former smoker,    breath sounds clear to auscultation       Cardiovascular negative cardio ROS   Rhythm:regular Rate:Normal     Neuro/Psych H/o meningioma.    GI/Hepatic (+) Hepatitis -, B  Endo/Other  diabetes, Type 2  Renal/GU      Musculoskeletal   Abdominal   Peds  Hematology   Anesthesia Other Findings   Reproductive/Obstetrics                             Anesthesia Physical Anesthesia Plan  ASA: II  Anesthesia Plan: General   Post-op Pain Management:    Induction: Intravenous  PONV Risk Score and Plan: 2 and Ondansetron, Dexamethasone, Midazolam and Treatment may vary due to age or medical condition  Airway Management Planned: Oral ETT  Additional Equipment:   Intra-op Plan:   Post-operative Plan: Extubation in OR  Informed Consent: I have reviewed the patients History and Physical, chart, labs and discussed the procedure including the risks, benefits and alternatives for the proposed anesthesia with the patient or authorized representative who has indicated his/her understanding and acceptance.       Plan Discussed with: CRNA, Anesthesiologist and Surgeon  Anesthesia Plan Comments:         Anesthesia Quick Evaluation

## 2020-04-04 NOTE — Op Note (Signed)
04/04/2020  6:38 PM  PATIENT:  Harold Torres  44 y.o. male Returns for cranioplasty after having a post op wound infection PRE-OPERATIVE DIAGNOSIS:  Other acquired deformity of head  POST-OPERATIVE DIAGNOSIS:  Other acquired deformity of head  PROCEDURE:  Procedure(s): Replacement of prosthetic bone flap  SURGEON: Surgeon(s): Ashok Pall, MD  ASSISTANTS:none  ANESTHESIA:   general  EBL:  Total I/O In: 2000 [I.V.:1500; IV Piggyback:500] Out: 750 [Blood:750]  BLOOD ADMINISTERED:none  CELL SAVER GIVEN:none  COUNT:per nursing  DRAINS: (1) Hemovact drain(s) in the subgaleal with  Suction Open   SPECIMEN:  No Specimen  DICTATION: Harold Torres was taken to the operating room, intubated, and placed under a general anesthetic without difficulty. He was positioned prone with his head on a horseshoe head rest. He was prepped and draped in a sterile manner.  I used retractors to fully expose the skull defect. I placed the peek implant, and secured it with plates and screws. I closed the scalp in one layer. Since he was packed open the skin had retracted. The scalp was quite tight when closing. I did undermine the galea to help with the closure. I applied a sterile dressing. He was rolled supine and extubated.  PLAN OF CARE: Admit to inpatient   PATIENT DISPOSITION:  PACU - hemodynamically stable.   Delay start of Pharmacological VTE agent (>24hrs) due to surgical blood loss or risk of bleeding:  no

## 2020-04-05 LAB — GLUCOSE, CAPILLARY
Glucose-Capillary: 182 mg/dL — ABNORMAL HIGH (ref 70–99)
Glucose-Capillary: 192 mg/dL — ABNORMAL HIGH (ref 70–99)
Glucose-Capillary: 151 mg/dL — ABNORMAL HIGH (ref 70–99)
Glucose-Capillary: 224 mg/dL — ABNORMAL HIGH (ref 70–99)

## 2020-04-05 NOTE — Progress Notes (Signed)
Pt reported via interpreter that his right eye was hurting like something was in it. There was a little bit of a language barrier but we worked it out. This nurse flushed the eye and wiped the lashes from around the lids. This nurse also gave pain meds and an ice pack to place on the eye. When this nurse checked on the pt later the eye was much less swollen as the pt was able to open it with out using fingers. Pt states the eye is ok. Katherina Right RN

## 2020-04-05 NOTE — Progress Notes (Signed)
  NEUROSURGERY PROGRESS NOTE   No issues overnight. Complains of HA this am No new N/T/W  EXAM:  BP 105/77   Pulse 86   Temp 98.1 F (36.7 C)   Resp 19   Ht 5\' 5"  (1.651 m)   Wt 86.2 kg   SpO2 95%   BMI 31.62 kg/m   Awake, alert, oriented  Speech fluent, appropriate  CN grossly intact  MAEW Crani site with dried blood, no active drainage. Remains well approximated without breakdown Drain in place  IMPRESSION/PLAN 44 y.o. male POD1 replacement of bone flap. Doing well. - continue to progress through post operative phase

## 2020-04-05 NOTE — Anesthesia Postprocedure Evaluation (Signed)
Anesthesia Post Note  Patient: Donyea Beverlin  Procedure(s) Performed: Replacement of prosthetic bone flap (N/A )     Patient location during evaluation: PACU Anesthesia Type: General Level of consciousness: awake and alert Pain management: pain level controlled Vital Signs Assessment: post-procedure vital signs reviewed and stable Respiratory status: spontaneous breathing, nonlabored ventilation, respiratory function stable and patient connected to nasal cannula oxygen Cardiovascular status: blood pressure returned to baseline and stable Postop Assessment: no apparent nausea or vomiting Anesthetic complications: no   No complications documented.  Last Vitals:  Vitals:   04/05/20 0600 04/05/20 0810  BP: 105/79 105/77  Pulse: 85 86  Resp: 18 19  Temp:  36.7 C  SpO2: 95% 95%    Last Pain:  Vitals:   04/04/20 2315  TempSrc: Oral  PainSc:                  [place]

## 2020-04-06 LAB — GLUCOSE, CAPILLARY
Glucose-Capillary: 156 mg/dL — ABNORMAL HIGH (ref 70–99)
Glucose-Capillary: 284 mg/dL — ABNORMAL HIGH (ref 70–99)
Glucose-Capillary: 227 mg/dL — ABNORMAL HIGH (ref 70–99)
Glucose-Capillary: 168 mg/dL — ABNORMAL HIGH (ref 70–99)

## 2020-04-06 MED ORDER — POLYVINYL ALCOHOL 1.4 % OP SOLN
1.0000 [drp] | OPHTHALMIC | Status: DC | PRN
  Administered 2020-04-06: 1 [drp] via OPHTHALMIC
  Filled 2020-04-06: qty 15

## 2020-04-06 NOTE — Progress Notes (Signed)
  NEUROSURGERY PROGRESS NOTE   No issues overnight.  No concerns this am  EXAM:  BP 113/75 (BP Location: Right Arm)   Pulse 87   Temp 98 F (36.7 C) (Oral)   Resp 16   Ht 5\' 5"  (1.651 m)   Wt 86.2 kg   SpO2 94%   BMI 31.62 kg/m   Awake, alert Speech fluent, appropriate  CN grossly intact  MAEW Crani site with dried blood, no active drainage. Remains well approximated without breakdown Drain in place. No recorded output by nursing yesterday  IMPRESSION/PLAN 44 y.o. male POD2 replacement of bone flap. Doing well. - continue to progress through post operative phase - asked nursing to monitor drain output today so we can consider removal

## 2020-04-07 ENCOUNTER — Encounter (HOSPITAL_COMMUNITY): Payer: Self-pay | Admitting: Neurosurgery

## 2020-04-07 ENCOUNTER — Ambulatory Visit: Payer: 59 | Admitting: Internal Medicine

## 2020-04-07 LAB — GLUCOSE, CAPILLARY
Glucose-Capillary: 158 mg/dL — ABNORMAL HIGH (ref 70–99)
Glucose-Capillary: 237 mg/dL — ABNORMAL HIGH (ref 70–99)
Glucose-Capillary: 215 mg/dL — ABNORMAL HIGH (ref 70–99)
Glucose-Capillary: 197 mg/dL — ABNORMAL HIGH (ref 70–99)

## 2020-04-07 MED ORDER — HYDROCODONE-ACETAMINOPHEN 5-325 MG PO TABS
1.0000 | ORAL_TABLET | ORAL | 0 refills | Status: AC | PRN
Start: 2020-04-07 — End: 2020-04-14

## 2020-04-07 NOTE — Discharge Summary (Signed)
Physician Discharge Summary  Patient ID: Harold Torres MRN: [id number] DOB/AGE: 09/19/1975 44 y.o.  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 Due to the location I left him in the hospital so I could monitor the wound and to perform dressing changes. After two weeks of IV abx, ceftriaxone, he was taken back to the operating room for a cranioplasty. Post op he has done well. He is ambulating, voiding, and tolerating a regular diet.  I will see him in one week for a wound check. He is neurologically normal.   Treatments: surgery: as above  Discharge Exam: Blood pressure 105/72, pulse 93, temperature 98 F (36.7 C), temperature source Oral, resp. rate 12, height 5\' 5"  (1.651 m), weight 86.2 kg, SpO2 95 %. General appearance: alert, cooperative, appears stated age and no distress  Disposition: Discharge disposition: 01-Home or Self Care      Other acquired deformity of head  Allergies as of 04/07/2020   No Known Allergies     Medication List    TAKE these medications   HYDROcodone-acetaminophen 5-325 MG tablet Commonly known as: NORCO/VICODIN Take 1 tablet by mouth every 4 (four) hours as needed for up to 7 days for moderate pain. What changed: when to take this   metFORMIN 500 MG 24 hr tablet Commonly known as: GLUCOPHAGE-XR Take 1,500 mg by mouth in the morning and at bedtime.       Follow-up Information    Ashok Pall, MD Follow up in 1 week(s).   Specialty: Neurosurgery Why: please call to make an appointment Contact information: [address] [place]  [address] [place] [address] [phone]               Signed: Ashok Pall 04/07/2020, 5:38 PM

## 2020-04-07 NOTE — Progress Notes (Signed)
Patient ID: Harold Torres, male   DOB: Dec 21, 1975, 44 y.o.   MRN: [id number] BP 105/72 (BP Location: Left Arm)   Pulse 93   Temp 98 F (36.7 C) (Oral)   Resp 12   Ht 5\' 5"  (1.651 m)   Wt 86.2 kg   SpO2 95%   BMI 31.62 kg/m  Wound is clean, integrity is good Moving all extremities Normal neurological exam.  Discharge in am

## 2020-04-08 ENCOUNTER — Ambulatory Visit: Payer: 59

## 2020-04-08 LAB — GLUCOSE, CAPILLARY: Glucose-Capillary: 165 mg/dL — ABNORMAL HIGH (ref 70–99)

## 2020-04-08 NOTE — Progress Notes (Signed)
Pt given dc instructions. Pt looked at the appointments and stated he understood. This nurse also showed pt pharmacy which pt also confirmed understanding. Pt insisted on ambulating out of hospital which he did without difficulty or instability. Katherina Right RN

## 2020-04-09 ENCOUNTER — Ambulatory Visit: Payer: 59 | Admitting: Radiation Oncology

## 2020-04-09 NOTE — Progress Notes (Signed)
Location/Histology of Brain Tumor: Rhabdoid Meningioma  Scalp mass with erosion of the skull.  Patient presented in June 2021 with a 6 month history of a scalp mass that is increasing in size.  CT head 02/11/2020: Study for stereotactic surgical planning.  Resolving cerebellar blood products with no new intracranial abnormality. Broad 6 cm posterior vertex craniectomy with areas of irregularity along the bone margins.  MRI Brain 02/09/2020: Unchanged appearance of intraparenchymal blood in the midportion of the cerebellum. Status post resection of large mass at the skull vertex. No nodular intracranial contrast enhancement. Persistent area of contrast enhancement of the right parietal calvarium could indicate some residual tumor or reactive osteitis.  This will serve as a baseline for follow-up. Contrast enhancing material in the subgaleal space overlying the craniectomy site, possibly postsurgical. Follow-up will be helpful to differentiate from tumor.  CT Head 02/05/2020: Status post resection of large extra-axial mass at the posterior vertex. Small amount residual soft tissue at the resection site. When possible, MRI of the brain with and without contrast is recommended to establish a postoperative baseline.  CT Head 12/20/2019: 9.9 x 8.3 x 4.2 cm soft tissue mass within the posterior scalp which is seen to cause significant destruction or erosion of the adjacent posterior Calvarium.  Surgical Pathology: Brain tumor, Occipital 01/31/2020    Past or anticipated interventions, if any, per neurosurgery:  Dr. 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  Confusion/Memory deficits: No   SAFETY ISSUES:  Prior radiation? no  Pacemaker/ICD? No  Possible current pregnancy? n/a  Is the patient on methotrexate? No  Additional Complaints / other details:

## 2020-04-10 ENCOUNTER — Telehealth: Payer: Self-pay | Admitting: *Deleted

## 2020-04-10 ENCOUNTER — Ambulatory Visit
Admission: RE | Admit: 2020-04-10 | Discharge: 2020-04-10 | Disposition: A | Discharge status: Home / Self Care | Payer: 59 | Source: Ambulatory Visit | Attending: Radiation Oncology | Admitting: Radiation Oncology

## 2020-04-10 ENCOUNTER — Other Ambulatory Visit: Payer: Self-pay

## 2020-04-10 ENCOUNTER — Encounter: Payer: Self-pay | Admitting: Radiation Oncology

## 2020-04-10 ENCOUNTER — Other Ambulatory Visit: Payer: Self-pay | Admitting: Radiation Oncology

## 2020-04-10 VITALS — BP 110/75 | HR 84 | Temp 96.1°F | Resp 18 | Ht 65.0 in | Wt 194.5 lb

## 2020-04-10 DIAGNOSIS — D329 Benign neoplasm of meninges, unspecified: Secondary | ICD-10-CM

## 2020-04-10 DIAGNOSIS — Z79899 Other long term (current) drug therapy: Secondary | ICD-10-CM | POA: Diagnosis not present

## 2020-04-10 DIAGNOSIS — E119 Type 2 diabetes mellitus without complications: Secondary | ICD-10-CM | POA: Diagnosis not present

## 2020-04-10 DIAGNOSIS — Z7984 Long term (current) use of oral hypoglycemic drugs: Secondary | ICD-10-CM | POA: Diagnosis not present

## 2020-04-10 DIAGNOSIS — Z87891 Personal history of nicotine dependence: Secondary | ICD-10-CM | POA: Insufficient documentation

## 2020-04-10 DIAGNOSIS — D320 Benign neoplasm of cerebral meninges: Secondary | ICD-10-CM | POA: Diagnosis not present

## 2020-04-10 NOTE — Progress Notes (Addendum)
Radiation Oncology         [phone] ________________________________  Name: Harold Torres        MRN: [id number]  Date of Service: 04/10/2020 DOB: 08-Dec-1975  CC:Pcp, No  Ashok Pall, MD     REFERRING PHYSICIAN: Ashok Pall, MD   DIAGNOSIS: The encounter diagnosis was Meningioma [facility]).   HISTORY OF PRESENT ILLNESS: Harold Torres is a 44 y.o. male seen at the request of Dr. Christella Noa for a recently diagnosed rhabdoid meningioma.  The patient presented after approximately a 23-month period of time where he was noticing increasing in a lesion of the posterior scalp.  His primary care and dermatologist sent him to plastic surgery, and further imaging by CT on 12/20/2019 revealed a 9.9 x 8.3 x 4.2 cm soft tissue mass with peripheral calcifications in the posterior scalp causing significant destruction/erosion of the adjacent posterior calvarium, he was referred to Dr. Christella Noa and underwent occipital craniotomy for mass on 01/31/2020.  Biopsy from that timeframe revealed a meningioma and biopsies were obtained and consistent with a grade 3 rhabdoid meningioma.  The patient had such destruction of the calvarium that he went back for surgery again on 02/08/2020 for further resection of the tumor, he then underwent cranioplasty on 03/14/2020, and subsequent incision and drainage following an infection on 03/21/2020.  Last week on 04/04/2020 he underwent replacement of the prosthetic bone flap, and is seen today to discuss the option of adjuvant radiotherapy upon further healing.  Of note his last imaging of the head was a CT without contrast of the head on 02/11/2020 prior to his cranioplasty.   PREVIOUS RADIATION THERAPY: No   PAST MEDICAL HISTORY:  Past Medical History:  Diagnosis Date  . Diabetes mellitus without complication ([place])   . Generalized skin cysts    back x 2, left leg, hand  . Hepatitis    Hepatitis B  . Meningioma ([place])        PAST SURGICAL HISTORY: Past Surgical History:   Procedure Laterality Date  . CRANIOPLASTY N/A 03/14/2020   Procedure: CRANIOPLASTY;  Surgeon: Ashok Pall, MD;  Location: [facility];  Service: Neurosurgery;  Laterality: N/A;  . CRANIOPLASTY N/A 03/21/2020   Procedure: Cranial wound incision and drainage;  Surgeon: Ashok Pall, MD;  Location: [facility];  Service: Neurosurgery;  Laterality: N/A;  Cranial wound incision and drainage  . CRANIOPLASTY N/A 04/04/2020   Procedure: Replacement of prosthetic bone flap;  Surgeon: Ashok Pall, MD;  Location: [facility];  Service: Neurosurgery;  Laterality: N/A;  . CRANIOTOMY Bilateral 01/31/2020   Procedure: OCCIPITAL CRANIOTOMY FOR MASS;  Surgeon: Ashok Pall, MD;  Location: [facility];  Service: Neurosurgery;  Laterality: Bilateral;  . CRANIOTOMY N/A 02/08/2020   Procedure: CRANIECTOMY FOR TUMOR;  Surgeon: Ashok Pall, MD;  Location: [facility];  Service: Neurosurgery;  Laterality: N/A;  CRANIECTOMY FOR TUMOR     FAMILY HISTORY: History reviewed. No pertinent family history.   SOCIAL HISTORY:  reports that he has quit smoking. His smoking use included cigarettes. He has never used smokeless tobacco. He reports that he does not drink alcohol and does not use drugs. The patient is married and lives in [place]. He has a newborn baby at home born in the midst of his recent health issues. He works at a [employer].   ALLERGIES: Patient has no known allergies.   MEDICATIONS:  Current Outpatient Medications  Medication Sig Dispense Refill  . cetirizine (ZYRTEC) 10 MG tablet Take 10 mg by mouth  daily.    . metFORMIN (GLUCOPHAGE-XR) 500 MG 24 hr tablet Take 1,500 mg by mouth in the morning and at bedtime.     Marland Kitchen HYDROcodone-acetaminophen (NORCO/VICODIN) 5-325 MG tablet Take 1 tablet by mouth every 4 (four) hours as needed for up to 7 days for moderate pain. (Patient not taking: Reported on 04/10/2020) 30 tablet 0   No current facility-administered medications for this encounter.     REVIEW OF SYSTEMS: On  review of systems, the patient reports that he is doing well overall. He is still having some headaches at time as well as some blurry vision and light sensitivity in his right eye which has been felt to be anticipated due to his recent surgeries. He is not taking any or steroid medication at this time. No other complaints are noted.    PHYSICAL EXAM:  Wt Readings from Last 3 Encounters:  04/10/20 194 lb 8 oz (88.2 kg)  03/21/20 190 lb (86.2 kg)  03/14/20 195 lb 15.8 oz (88.9 kg)   Temp Readings from Last 3 Encounters:  04/10/20 (!) 96.1 F (35.6 C) (Tympanic)  04/08/20 98.2 F (36.8 C) (Oral)  03/15/20 98.3 F (36.8 C) (Oral)   BP Readings from Last 3 Encounters:  04/10/20 110/75  04/08/20 99/72  03/15/20 110/74   Pulse Readings from Last 3 Encounters:  04/10/20 84  04/08/20 80  03/15/20 80   Pain Assessment Pain Score: 3  Pain Loc: Head (at his incision site on his scalp.)/10  In general this is a well appearing Asian male in no acute distress.  He's alert and oriented x4 and appropriate throughout the examination. Cardiopulmonary assessment is negative for acute distress and he exhibits normal effort. His posterior scalp incision is well healed with intact sutures and eschar along the incision.   ECOG = 1  0 - Asymptomatic (Fully active, able to carry on all predisease activities without restriction)  1 - Symptomatic but completely ambulatory (Restricted in physically strenuous activity but ambulatory and able to carry out work of a light or sedentary nature. For example, light housework, office work)  2 - Symptomatic, <50% in bed during the day (Ambulatory and capable of all self care but unable to carry out any work activities. Up and about more than 50% of waking hours)  3 - Symptomatic, >50% in bed, but not bedbound (Capable of only limited self-care, confined to bed or chair 50% or more of waking hours)  4 - Bedbound (Completely disabled. Cannot carry on any  self-care. Totally confined to bed or chair)  5 - Death   Eustace Pen MM, Creech RH, Tormey DC, et al. [phone]). "Toxicity and response criteria of the [facility] Group". [place] Oncol. 5 (6): 649-55    LABORATORY DATA:  Lab Results  Component Value Date   WBC 7.5 03/26/2020   HGB 12.4 (L) 03/26/2020   HCT 38.0 (L) 03/26/2020   MCV 84.8 03/26/2020   PLT 262 03/26/2020   Lab Results  Component Value Date   NA 137 03/26/2020   K 4.5 03/26/2020   CL 105 03/26/2020   CO2 23 03/26/2020   Lab Results  Component Value Date   ALT 57 (H) 03/26/2020   AST 45 (H) 03/26/2020   ALKPHOS 77 03/26/2020   BILITOT 0.4 03/26/2020      RADIOGRAPHY: No results found.     IMPRESSION/PLAN: 1. Grade 3 Rhabdoid Meningioma. Dr. Lisbeth Renshaw discusses the pathology findings and reviews the nature of high grade meningiomas  and the role of radiotherapy to reduce the risks of malignant transformation. We discussed the risks, benefits, short, and long term effects of radiotherapy, and the patient is interested in proceeding. Dr. Lisbeth Renshaw discusses the delivery and logistics of radiotherapy and anticipates a course of 6 weeks of radiotherapy, but would recommend further healing and repeat MRI in about 5 weeks. Written consent is obtained and placed in the chart, a copy was provided to the patient for the appropriate time. We will coordinate his simulation following once we know the date of his MRI.  In a visit lasting 60 minutes, greater than 50% of the time was spent face to face discussing the patient's condition, in preparation for the discussion, and coordinating the patient's care in the presence of an interpreter.   The above documentation reflects my direct findings during this shared patient visit. Please see the separate note by Dr. Lisbeth Renshaw on this date for the remainder of the patient's plan of care.    Carola Rhine, PAC

## 2020-04-10 NOTE — Telephone Encounter (Signed)
Called and left message for a call back and to schedule a Mandarin Interpreter for the next appointment  04/14/20 @ 8:00 a.m. in radiation oncology.

## 2020-04-14 ENCOUNTER — Other Ambulatory Visit: Payer: Self-pay

## 2020-04-14 ENCOUNTER — Ambulatory Visit: Payer: 59 | Admitting: Radiation Oncology

## 2020-04-17 ENCOUNTER — Ambulatory Visit (HOSPITAL_COMMUNITY)
Admission: RE | Admit: 2020-04-17 | Discharge: 2020-04-17 | Disposition: A | Discharge status: Home / Self Care | Payer: 59 | Source: Ambulatory Visit | Attending: Neurosurgery | Admitting: Neurosurgery

## 2020-04-17 ENCOUNTER — Ambulatory Visit (HOSPITAL_COMMUNITY): Payer: 59 | Admitting: Certified Registered"

## 2020-04-17 ENCOUNTER — Other Ambulatory Visit: Payer: Self-pay | Admitting: Neurosurgery

## 2020-04-17 ENCOUNTER — Other Ambulatory Visit: Payer: Self-pay

## 2020-04-17 ENCOUNTER — Encounter (HOSPITAL_COMMUNITY)
Admission: RE | Disposition: A | Discharge status: Home / Self Care | Payer: Self-pay | Source: Ambulatory Visit | Attending: Neurosurgery

## 2020-04-17 ENCOUNTER — Encounter (HOSPITAL_COMMUNITY): Payer: Self-pay | Admitting: Neurosurgery

## 2020-04-17 DIAGNOSIS — T8131XA Disruption of external operation (surgical) wound, not elsewhere classified, initial encounter: Secondary | ICD-10-CM | POA: Insufficient documentation

## 2020-04-17 DIAGNOSIS — Z20822 Contact with and (suspected) exposure to covid-19: Secondary | ICD-10-CM | POA: Diagnosis not present

## 2020-04-17 DIAGNOSIS — T8130XA Disruption of wound, unspecified, initial encounter: Secondary | ICD-10-CM | POA: Diagnosis present

## 2020-04-17 DIAGNOSIS — Y838 Other surgical procedures as the cause of abnormal reaction of the patient, or of later complication, without mention of misadventure at the time of the procedure: Secondary | ICD-10-CM | POA: Insufficient documentation

## 2020-04-17 HISTORY — DX: Other seasonal allergic rhinitis: J30.2

## 2020-04-17 HISTORY — PX: WOUND EXPLORATION: SHX6188

## 2020-04-17 LAB — BASIC METABOLIC PANEL
Anion gap: 10 (ref 5–15)
BUN: 18 mg/dL (ref 6–20)
CO2: 23 mmol/L (ref 22–32)
Calcium: 8.8 mg/dL — ABNORMAL LOW (ref 8.9–10.3)
Chloride: 107 mmol/L (ref 98–111)
Creatinine, Ser: 0.9 mg/dL (ref 0.61–1.24)
GFR, Estimated: >60 mL/min (ref >60)
Glucose, Bld: 141 mg/dL — ABNORMAL HIGH (ref 70–99)
Potassium: 4 mmol/L (ref 3.5–5.1)
Sodium: 140 mmol/L (ref 135–145)

## 2020-04-17 LAB — CBC
HCT: 34.4 % — ABNORMAL LOW (ref 39.0–52.0)
Hemoglobin: 10.4 g/dL — ABNORMAL LOW (ref 13.0–17.0)
MCH: 24.5 pg — ABNORMAL LOW (ref 26.0–34.0)
MCHC: 30.2 g/dL (ref 30.0–36.0)
MCV: 80.9 fL (ref 80.0–100.0)
Platelets: 227 10*3/uL (ref 150–400)
RBC: 4.25 MIL/uL (ref 4.22–5.81)
RDW: 13.8 % (ref 11.5–15.5)
WBC: 4.7 10*3/uL (ref 4.0–10.5)
nRBC: 0 % (ref 0.0–0.2)

## 2020-04-17 LAB — GLUCOSE, CAPILLARY: Glucose-Capillary: 140 mg/dL — ABNORMAL HIGH (ref 70–99)

## 2020-04-17 LAB — SARS CORONAVIRUS 2 BY RT PCR (HOSPITAL ORDER, PERFORMED IN ~~LOC~~ HOSPITAL LAB): SARS Coronavirus 2: NEGATIVE

## 2020-04-17 SURGERY — WOUND EXPLORATION
Anesthesia: Monitor Anesthesia Care

## 2020-04-17 MED ORDER — OXYCODONE HCL 5 MG PO TABS: 5.0000 mg | ORAL_TABLET | Freq: Once | ORAL | Status: DC | PRN

## 2020-04-17 MED ORDER — SUCCINYLCHOLINE CHLORIDE 200 MG/10ML IV SOSY
PREFILLED_SYRINGE | INTRAVENOUS | Status: AC
  Filled 2020-04-17: qty 10

## 2020-04-17 MED ORDER — ACETAMINOPHEN 10 MG/ML IV SOLN: 1000.0000 mg | Freq: Once | INTRAVENOUS | Status: DC | PRN

## 2020-04-17 MED ORDER — ACETAMINOPHEN 160 MG/5ML PO SOLN: 1000.0000 mg | Freq: Once | ORAL | Status: DC | PRN

## 2020-04-17 MED ORDER — BACITRACIN ZINC 500 UNIT/GM EX OINT
TOPICAL_OINTMENT | CUTANEOUS | Status: DC | PRN
  Administered 2020-04-17: 1 via TOPICAL

## 2020-04-17 MED ORDER — ACETAMINOPHEN 500 MG PO TABS: 1000.0000 mg | ORAL_TABLET | Freq: Once | ORAL | Status: DC | PRN

## 2020-04-17 MED ORDER — SODIUM CHLORIDE 0.9 % IV SOLN
2.0000 g | Freq: Once | INTRAVENOUS | Status: AC
  Administered 2020-04-17: 2 g via INTRAVENOUS
  Filled 2020-04-17: qty 2

## 2020-04-17 MED ORDER — CHLORHEXIDINE GLUCONATE 0.12 % MT SOLN
15.0000 mL | Freq: Once | OROMUCOSAL | Status: AC
  Administered 2020-04-17: 15 mL via OROMUCOSAL
  Filled 2020-04-17: qty 15

## 2020-04-17 MED ORDER — FENTANYL CITRATE (PF) 100 MCG/2ML IJ SOLN
25.0000 ug | INTRAMUSCULAR | Status: DC | PRN
  Administered 2020-04-17: 50 ug via INTRAVENOUS

## 2020-04-17 MED ORDER — PROPOFOL 500 MG/50ML IV EMUL
INTRAVENOUS | Status: DC | PRN
  Administered 2020-04-17: 75 ug/kg/min via INTRAVENOUS

## 2020-04-17 MED ORDER — OXYCODONE HCL 5 MG PO TABS: 5.0000 mg | ORAL_TABLET | Freq: Four times a day (QID) | ORAL | 0 refills | Status: AC | PRN

## 2020-04-17 MED ORDER — LACTATED RINGERS IV SOLN: INTRAVENOUS | Status: DC | PRN

## 2020-04-17 MED ORDER — LIDOCAINE-EPINEPHRINE 0.5 %-1:200000 IJ SOLN
INTRAMUSCULAR | Status: AC
  Filled 2020-04-17: qty 1

## 2020-04-17 MED ORDER — PROPOFOL 10 MG/ML IV BOLUS
INTRAVENOUS | Status: AC
  Filled 2020-04-17: qty 20

## 2020-04-17 MED ORDER — ORAL CARE MOUTH RINSE: 15.0000 mL | Freq: Once | OROMUCOSAL | Status: AC

## 2020-04-17 MED ORDER — LIDOCAINE-EPINEPHRINE 0.5 %-1:200000 IJ SOLN
INTRAMUSCULAR | Status: DC | PRN
  Administered 2020-04-17: 20 mL

## 2020-04-17 MED ORDER — OXYCODONE HCL 5 MG/5ML PO SOLN: 5.0000 mg | Freq: Once | ORAL | Status: DC | PRN

## 2020-04-17 MED ORDER — VANCOMYCIN HCL 1000 MG IV SOLR
INTRAVENOUS | Status: AC
  Filled 2020-04-17: qty 1000

## 2020-04-17 MED ORDER — LACTATED RINGERS IV SOLN: INTRAVENOUS | Status: DC

## 2020-04-17 MED ORDER — SODIUM CHLORIDE 0.9 % IV SOLN: INTRAVENOUS | Status: DC

## 2020-04-17 MED ORDER — LIDOCAINE HCL (CARDIAC) PF 100 MG/5ML IV SOSY
PREFILLED_SYRINGE | INTRAVENOUS | Status: DC | PRN
  Administered 2020-04-17: 100 mg via INTRAVENOUS

## 2020-04-17 MED ORDER — PHENYLEPHRINE 40 MCG/ML (10ML) SYRINGE FOR IV PUSH (FOR BLOOD PRESSURE SUPPORT)
PREFILLED_SYRINGE | INTRAVENOUS | Status: AC
  Filled 2020-04-17: qty 10

## 2020-04-17 MED ORDER — DEXAMETHASONE SODIUM PHOSPHATE 10 MG/ML IJ SOLN
INTRAMUSCULAR | Status: AC
  Filled 2020-04-17: qty 1

## 2020-04-17 MED ORDER — BACITRACIN ZINC 500 UNIT/GM EX OINT
TOPICAL_OINTMENT | CUTANEOUS | Status: AC
  Filled 2020-04-17: qty 28.35

## 2020-04-17 MED ORDER — 0.9 % SODIUM CHLORIDE (POUR BTL) OPTIME
TOPICAL | Status: DC | PRN
  Administered 2020-04-17: 1000 mL

## 2020-04-17 MED ORDER — MIDAZOLAM HCL 2 MG/2ML IJ SOLN
INTRAMUSCULAR | Status: AC
  Filled 2020-04-17: qty 2

## 2020-04-17 MED ORDER — ONDANSETRON HCL 4 MG/2ML IJ SOLN
INTRAMUSCULAR | Status: AC
  Filled 2020-04-17: qty 2

## 2020-04-17 MED ORDER — FENTANYL CITRATE (PF) 100 MCG/2ML IJ SOLN
INTRAMUSCULAR | Status: DC
Start: 2020-04-17 — End: 2020-04-18
  Filled 2020-04-17: qty 2

## 2020-04-17 MED ORDER — FENTANYL CITRATE (PF) 100 MCG/2ML IJ SOLN
INTRAMUSCULAR | Status: DC | PRN
Start: 2020-04-17 — End: 2020-04-17
  Administered 2020-04-17 (×2): 25 ug via INTRAVENOUS

## 2020-04-17 MED ORDER — ROCURONIUM BROMIDE 10 MG/ML (PF) SYRINGE
PREFILLED_SYRINGE | INTRAVENOUS | Status: AC
  Filled 2020-04-17: qty 10

## 2020-04-17 MED ORDER — FENTANYL CITRATE (PF) 250 MCG/5ML IJ SOLN
INTRAMUSCULAR | Status: AC
  Filled 2020-04-17: qty 5

## 2020-04-17 MED ORDER — LIDOCAINE 2% (20 MG/ML) 5 ML SYRINGE
INTRAMUSCULAR | Status: AC
  Filled 2020-04-17: qty 5

## 2020-04-17 SURGICAL SUPPLY — 42 items
APL SKNCLS STERI-STRIP NONHPOA (GAUZE/BANDAGES/DRESSINGS)
BENZOIN TINCTURE PRP APPL 2/3 (GAUZE/BANDAGES/DRESSINGS) IMPLANT
BLADE CLIPPER SURG (BLADE) IMPLANT
CANISTER SUCT 3000ML PPV (MISCELLANEOUS) ×3 IMPLANT
CARTRIDGE OIL MAESTRO DRILL (MISCELLANEOUS) IMPLANT
CLOSURE WOUND 1/2 X4 (GAUZE/BANDAGES/DRESSINGS)
COVER WAND RF STERILE (DRAPES) IMPLANT
DIFFUSER DRILL AIR PNEUMATIC (MISCELLANEOUS) IMPLANT
DRAPE LAPAROTOMY 100X72 PEDS (DRAPES) IMPLANT
DRAPE LAPAROTOMY 100X72X124 (DRAPES) IMPLANT
DRAPE ORTHO SPLIT 77X108 STRL (DRAPES) ×3
DRAPE SURG 17X23 STRL (DRAPES) ×12 IMPLANT
DRAPE SURG IRRIG POUCH 19X23 (DRAPES) ×3 IMPLANT
DRAPE SURG ORHT 6 SPLT 77X108 (DRAPES) ×1 IMPLANT
DRSG TELFA 3X8 NADH (GAUZE/BANDAGES/DRESSINGS) ×3 IMPLANT
ELECT REM PT RETURN 9FT ADLT (ELECTROSURGICAL) ×3
ELECTRODE REM PT RTRN 9FT ADLT (ELECTROSURGICAL) ×1 IMPLANT
GAUZE 4X4 16PLY RFD (DISPOSABLE) IMPLANT
GAUZE SPONGE 4X4 12PLY STRL (GAUZE/BANDAGES/DRESSINGS) ×3 IMPLANT
GLOVE ECLIPSE 6.5 STRL STRAW (GLOVE) ×3 IMPLANT
GLOVE EXAM NITRILE XL STR (GLOVE) IMPLANT
GOWN STRL REUS W/ TWL LRG LVL3 (GOWN DISPOSABLE) IMPLANT
GOWN STRL REUS W/ TWL XL LVL3 (GOWN DISPOSABLE) IMPLANT
GOWN STRL REUS W/TWL LRG LVL3 (GOWN DISPOSABLE)
GOWN STRL REUS W/TWL XL LVL3 (GOWN DISPOSABLE)
KIT BASIN OR (CUSTOM PROCEDURE TRAY) ×3 IMPLANT
KIT TURNOVER KIT B (KITS) ×3 IMPLANT
NEEDLE HYPO 22GX1.5 SAFETY (NEEDLE) IMPLANT
NS IRRIG 1000ML POUR BTL (IV SOLUTION) ×3 IMPLANT
OIL CARTRIDGE MAESTRO DRILL (MISCELLANEOUS)
PACK LAMINECTOMY NEURO (CUSTOM PROCEDURE TRAY) ×3 IMPLANT
PAD ARMBOARD 7.5X6 YLW CONV (MISCELLANEOUS) ×9 IMPLANT
STRIP CLOSURE SKIN 1/2X4 (GAUZE/BANDAGES/DRESSINGS) IMPLANT
SUT ETHILON 2 0 PSLX (SUTURE) ×6 IMPLANT
SUT VIC AB 1 CT1 18XBRD ANBCTR (SUTURE) IMPLANT
SUT VIC AB 1 CT1 8-18 (SUTURE)
SUT VIC AB 2-0 CP2 18 (SUTURE) IMPLANT
SWAB COLLECTION DEVICE MRSA (MISCELLANEOUS) IMPLANT
SWAB CULTURE ESWAB REG 1ML (MISCELLANEOUS) IMPLANT
TOWEL GREEN STERILE (TOWEL DISPOSABLE) ×3 IMPLANT
TOWEL GREEN STERILE FF (TOWEL DISPOSABLE) ×3 IMPLANT
WATER STERILE IRR 1000ML POUR (IV SOLUTION) ×3 IMPLANT

## 2020-04-17 NOTE — Transfer of Care (Signed)
Immediate Anesthesia Transfer of Care Note  Patient: Harold Torres  Procedure(s) Performed: Scalp Wound Repair (N/A )  Patient Location: PACU  Anesthesia Type:MAC  Level of Consciousness: drowsy  Airway & Oxygen Therapy: Patient Spontanous Breathing and Patient connected to nasal cannula oxygen  Post-op Assessment: Report given to RN, Post -op Vital signs reviewed and stable and Patient moving all extremities  Post vital signs: Reviewed and stable  Last Vitals:  Vitals Value Taken Time  BP 104/82 04/17/20 1602  Temp    Pulse 72 04/17/20 1606  Resp 10 04/17/20 1606  SpO2 100 % 04/17/20 1606  Vitals shown include unvalidated device data.  Last Pain:  Vitals:   04/17/20 1301  TempSrc: Oral  PainSc: 0-No pain      Patients Stated Pain Goal: 3 (30/94/07 6808)  Complications: No complications documented.

## 2020-04-17 NOTE — Progress Notes (Signed)
Used Pharmacist, hospital Ms. Lanny Cramp for  translation in [employer]

## 2020-04-17 NOTE — H&P (Signed)
BP 110/70   Pulse 77   Temp 98.7 F (37.1 C) (Oral)   Ht 5\' 5"  (1.651 m)   Wt 89.4 kg   SpO2 98%   BMI 32.78 kg/m  Harold Torres is a 44 y.o. male Who was initially taken to the operating room for an uncomplicated craniotomy for a rhabdoid meningioma. Upon return he had a custom cranioplasty flap placed. Post op he developed an infection in the wound. I removed the peek cranioplasty and he was treated with abx for two weeks. I then replaced the flap, but there was a significant gap in his wound. I was able to approximate it but the closure was slightly tenuous. Upon return to the office today, the wound opened in the middle of the incision approximately 1.5cm. He is here today for wound closure. No Known Allergies Past Medical History:  Diagnosis Date  . Diabetes mellitus without complication ([place])    type 2  . Generalized skin cysts    back x 2, left leg, hand  . Hepatitis    Hepatitis B   . Meningioma ([place])   . Seasonal allergies    Past Surgical History:  Procedure Laterality Date  . CRANIOPLASTY N/A 03/14/2020   Procedure: CRANIOPLASTY;  Surgeon: Ashok Pall, MD;  Location: [place];  Service: Neurosurgery;  Laterality: N/A;  . CRANIOPLASTY N/A 03/21/2020   Procedure: Cranial wound incision and drainage;  Surgeon: Ashok Pall, MD;  Location: [place];  Service: Neurosurgery;  Laterality: N/A;  Cranial wound incision and drainage  . CRANIOPLASTY N/A 04/04/2020   Procedure: Replacement of prosthetic bone flap;  Surgeon: Ashok Pall, MD;  Location: [place];  Service: Neurosurgery;  Laterality: N/A;  . CRANIOTOMY Bilateral 01/31/2020   Procedure: OCCIPITAL CRANIOTOMY FOR MASS;  Surgeon: Ashok Pall, MD;  Location: [place];  Service: Neurosurgery;  Laterality: Bilateral;  . CRANIOTOMY N/A 02/08/2020   Procedure: CRANIECTOMY FOR TUMOR;  Surgeon: Ashok Pall, MD;  Location: [place];  Service: Neurosurgery;  Laterality: N/A;  CRANIECTOMY FOR TUMOR   History reviewed. No pertinent family  history. Prior to Admission medications   Medication Sig Start Date End Date Taking? Authorizing Provider  metFORMIN (GLUCOPHAGE-XR) 500 MG 24 hr tablet Take 1,500 mg by mouth in the morning and at bedtime.  11/08/19  Yes [provider]  cetirizine (ZYRTEC) 10 MG tablet Take 10 mg by mouth daily. 04/08/20   [provider]   Physical Exam Constitutional:      Appearance: Normal appearance. He is normal weight.  HENT:     Head: Normocephalic.     Right Ear: Tympanic membrane and ear canal normal.     Left Ear: Tympanic membrane and ear canal normal.     Nose: Nose normal.     Mouth/Throat:     Mouth: Mucous membranes are moist.     Pharynx: Oropharynx is clear.  Eyes:     Extraocular Movements: Extraocular movements intact.     Conjunctiva/sclera: Conjunctivae normal.     Pupils: Pupils are equal, round, and reactive to light.  Cardiovascular:     Rate and Rhythm: Normal rate and regular rhythm.     Pulses: Normal pulses.     Heart sounds: Normal heart sounds.  Pulmonary:     Effort: Pulmonary effort is normal.     Breath sounds: Normal breath sounds.  Abdominal:     General: Abdomen is flat.     Palpations: Abdomen is soft.  Musculoskeletal:  General: Normal range of motion.     Cervical back: Normal range of motion and neck supple.  Skin:    General: Skin is warm.  Neurological:     General: No focal deficit present.     Mental Status: He is alert and oriented to person, place, and time.     Cranial Nerves: No cranial nerve deficit.     Motor: No weakness.     Gait: Gait normal.  Psychiatric:        Mood and Affect: Mood normal.        Behavior: Behavior normal.        Thought Content: Thought content normal.        Judgment: Judgment normal.   the wound is clean, dry, and without signs of infection.  OR for wound closure. Mr. Bracco through his interpreter understands the benefits and risks of this procedure. He understands and wishes to  proceed.

## 2020-04-17 NOTE — Op Note (Signed)
04/17/2020  4:23 PM  PATIENT:  Harold Torres  44 y.o. male With a 1.5cm dehiscence in his cranial wound. I will take him to the operating room to debride and primarily close the wound. PRE-OPERATIVE DIAGNOSIS:  Wound dehiscence  POST-OPERATIVE DIAGNOSIS:  Wound dehiscence  PROCEDURE:  Procedure(s): Scalp Wound Repair  SURGEON: Surgeon(s): Ashok Pall, MD  ASSISTANTS:none  ANESTHESIA:   local and IV sedation  EBL:  Total I/O In: 900 [I.V.:800; IV Piggyback:100] Out: -   BLOOD ADMINISTERED:none  CELL SAVER GIVEN:none  COUNT:correct  DRAINS: none   SPECIMEN:  No Specimen  DICTATION: [name] was taken to the operating room,positioned supine with his head up. His hair was shaved, and his scalp prepped in a sterile manner. I trimmed the scalp edges to gain fresh boundaries. I used a 2-0 nylon suture to place vertical mattress sutures to bring the scalp edges together in close approximation. I irrigated the wound prior to closure. I placed a sterile dressing.  He was awakened with ease.   PLAN OF CARE: Discharge to home after PACU  PATIENT DISPOSITION:  PACU - hemodynamically stable.   Delay start of Pharmacological VTE agent (>24hrs) due to surgical blood loss or risk of bleeding:  no

## 2020-04-17 NOTE — Anesthesia Preprocedure Evaluation (Addendum)
Anesthesia Evaluation  Patient identified by MRN, date of birth, ID band Patient awake    Reviewed: Allergy & Precautions, NPO status , Patient's Chart, lab work & pertinent test results  History of Anesthesia Complications Negative for: history of anesthetic complications  Airway Mallampati: II  TM Distance: >3 FB Neck ROM: Full    Dental  (+) Teeth Intact, Dental Advisory Given   Pulmonary neg pulmonary ROS, neg recent URI, former smoker,  Covid-19 Nucleic Acid Test Results Lab Results      Component                Value               Date                      SARSCOV2NAA              NEGATIVE            04/17/2020                [place]              NEGATIVE            03/21/2020                [place]              NEGATIVE            03/12/2020                [place]              Not Detected        01/28/2020              breath sounds clear to auscultation       Cardiovascular negative cardio ROS   Rhythm:Regular Rate:Normal     Neuro/Psych S/p resection meningioma negative psych ROS   GI/Hepatic negative GI ROS, (+) Hepatitis -, B  Endo/Other  diabetes  Renal/GU negative Renal ROS     Musculoskeletal negative musculoskeletal ROS (+)   Abdominal Normal abdominal exam  (+)   Peds  Hematology negative hematology ROS (+)   Anesthesia Other Findings   Reproductive/Obstetrics                            Anesthesia Physical Anesthesia Plan  ASA: II  Anesthesia Plan: MAC   Post-op Pain Management:    Induction: Intravenous  PONV Risk Score and Plan: 1 and Propofol infusion  Airway Management Planned: Nasal Cannula  Additional Equipment: None  Intra-op Plan:   Post-operative Plan:   Informed Consent: I have reviewed the patients History and Physical, chart, labs and discussed the procedure including the risks, benefits and alternatives for the proposed  anesthesia with the patient or authorized representative who has indicated his/her understanding and acceptance.     Dental advisory given  Plan Discussed with: CRNA and Surgeon  Anesthesia Plan Comments:         Anesthesia Quick Evaluation

## 2020-04-18 ENCOUNTER — Encounter (HOSPITAL_COMMUNITY): Payer: Self-pay | Admitting: Neurosurgery

## 2020-04-18 NOTE — Anesthesia Postprocedure Evaluation (Signed)
Anesthesia Post Note  Patient: Harold Torres  Procedure(s) Performed: Scalp Wound Repair (N/A )     Patient location during evaluation: PACU Anesthesia Type: MAC Level of consciousness: awake and alert Pain management: pain level controlled Vital Signs Assessment: post-procedure vital signs reviewed and stable Respiratory status: spontaneous breathing, nonlabored ventilation, respiratory function stable and patient connected to nasal cannula oxygen Cardiovascular status: stable and blood pressure returned to baseline Postop Assessment: no apparent nausea or vomiting Anesthetic complications: no   No complications documented.  Last Vitals:  Vitals:   04/17/20 1630 04/17/20 1645  BP: 111/84 119/90  Pulse: 64 69  Resp: 10 13  Temp:  36.4 C  SpO2: 100% 100%    Last Pain:  Vitals:   04/17/20 1645  TempSrc:   PainSc: 0-No pain                 [name]

## 2020-04-22 ENCOUNTER — Ambulatory Visit: Payer: 59 | Admitting: Radiation Oncology

## 2020-04-23 ENCOUNTER — Ambulatory Visit: Payer: 59

## 2020-04-24 ENCOUNTER — Ambulatory Visit: Payer: 59

## 2020-04-25 ENCOUNTER — Ambulatory Visit: Payer: 59

## 2020-04-28 ENCOUNTER — Ambulatory Visit: Payer: 59

## 2020-04-29 ENCOUNTER — Ambulatory Visit: Payer: 59

## 2020-04-30 ENCOUNTER — Ambulatory Visit: Payer: 59

## 2020-05-01 ENCOUNTER — Ambulatory Visit: Payer: 59

## 2020-05-02 ENCOUNTER — Ambulatory Visit: Payer: 59

## 2020-05-04 ENCOUNTER — Encounter (HOSPITAL_COMMUNITY): Payer: Self-pay | Admitting: Emergency Medicine

## 2020-05-04 ENCOUNTER — Inpatient Hospital Stay (HOSPITAL_COMMUNITY): Payer: 59

## 2020-05-04 ENCOUNTER — Inpatient Hospital Stay (HOSPITAL_COMMUNITY)
Admission: EM | Admit: 2020-05-04 | Discharge: 2020-05-16 | DRG: 857 | Disposition: A | Discharge status: Home Health | Payer: 59 | Attending: Neurosurgery | Admitting: Neurosurgery

## 2020-05-04 DIAGNOSIS — Z20822 Contact with and (suspected) exposure to covid-19: Secondary | ICD-10-CM | POA: Diagnosis present

## 2020-05-04 DIAGNOSIS — B961 Klebsiella pneumoniae [K. pneumoniae] as the cause of diseases classified elsewhere: Secondary | ICD-10-CM | POA: Diagnosis present

## 2020-05-04 DIAGNOSIS — Y831 Surgical operation with implant of artificial internal device as the cause of abnormal reaction of the patient, or of later complication, without mention of misadventure at the time of the procedure: Secondary | ICD-10-CM | POA: Diagnosis present

## 2020-05-04 DIAGNOSIS — E119 Type 2 diabetes mellitus without complications: Secondary | ICD-10-CM | POA: Diagnosis present

## 2020-05-04 DIAGNOSIS — Z23 Encounter for immunization: Secondary | ICD-10-CM

## 2020-05-04 DIAGNOSIS — T8149XA Infection following a procedure, other surgical site, initial encounter: Secondary | ICD-10-CM | POA: Diagnosis present

## 2020-05-04 DIAGNOSIS — Z7984 Long term (current) use of oral hypoglycemic drugs: Secondary | ICD-10-CM

## 2020-05-04 DIAGNOSIS — B1910 Unspecified viral hepatitis B without hepatic coma: Secondary | ICD-10-CM | POA: Diagnosis present

## 2020-05-04 DIAGNOSIS — T8579XA Infection and inflammatory reaction due to other internal prosthetic devices, implants and grafts, initial encounter: Secondary | ICD-10-CM | POA: Diagnosis present

## 2020-05-04 DIAGNOSIS — Z86011 Personal history of benign neoplasm of the brain: Secondary | ICD-10-CM

## 2020-05-04 DIAGNOSIS — T8141XA Infection following a procedure, superficial incisional surgical site, initial encounter: Principal | ICD-10-CM | POA: Diagnosis present

## 2020-05-04 DIAGNOSIS — Y929 Unspecified place or not applicable: Secondary | ICD-10-CM | POA: Diagnosis not present

## 2020-05-04 DIAGNOSIS — J302 Other seasonal allergic rhinitis: Secondary | ICD-10-CM | POA: Diagnosis present

## 2020-05-04 DIAGNOSIS — Z87891 Personal history of nicotine dependence: Secondary | ICD-10-CM

## 2020-05-04 LAB — CBC
HCT: 34.9 % — ABNORMAL LOW (ref 39.0–52.0)
Hemoglobin: 10.6 g/dL — ABNORMAL LOW (ref 13.0–17.0)
MCH: 23.3 pg — ABNORMAL LOW (ref 26.0–34.0)
MCHC: 30.4 g/dL (ref 30.0–36.0)
MCV: 76.7 fL — ABNORMAL LOW (ref 80.0–100.0)
Platelets: 179 10*3/uL (ref 150–400)
RBC: 4.55 MIL/uL (ref 4.22–5.81)
RDW: 14.7 % (ref 11.5–15.5)
WBC: 9 10*3/uL (ref 4.0–10.5)
nRBC: 0 % (ref 0.0–0.2)

## 2020-05-04 LAB — COMPREHENSIVE METABOLIC PANEL
ALT: 25 U/L (ref 0–44)
AST: 20 U/L (ref 15–41)
Albumin: 3.8 g/dL (ref 3.5–5.0)
Alkaline Phosphatase: 81 U/L (ref 38–126)
Anion gap: 9 (ref 5–15)
BUN: 17 mg/dL (ref 6–20)
CO2: 25 mmol/L (ref 22–32)
Calcium: 9.1 mg/dL (ref 8.9–10.3)
Chloride: 104 mmol/L (ref 98–111)
Creatinine, Ser: 0.73 mg/dL (ref 0.61–1.24)
GFR, Estimated: >60 mL/min (ref >60)
Glucose, Bld: 210 mg/dL — ABNORMAL HIGH (ref 70–99)
Potassium: 3.8 mmol/L (ref 3.5–5.1)
Sodium: 138 mmol/L (ref 135–145)
Total Bilirubin: 0.4 mg/dL (ref 0.3–1.2)
Total Protein: 7.3 g/dL (ref 6.5–8.1)

## 2020-05-04 LAB — CBC WITH DIFFERENTIAL/PLATELET
Abs Immature Granulocytes: 0.03 10*3/uL (ref 0.00–0.07)
Basophils Absolute: 0 10*3/uL (ref 0.0–0.1)
Basophils Relative: 0 %
Eosinophils Absolute: 0.3 10*3/uL (ref 0.0–0.5)
Eosinophils Relative: 3 %
HCT: 37.4 % — ABNORMAL LOW (ref 39.0–52.0)
Hemoglobin: 11.5 g/dL — ABNORMAL LOW (ref 13.0–17.0)
Immature Granulocytes: 0 %
Lymphocytes Relative: 16 %
Lymphs Abs: 1.7 10*3/uL (ref 0.7–4.0)
MCH: 23.8 pg — ABNORMAL LOW (ref 26.0–34.0)
MCHC: 30.7 g/dL (ref 30.0–36.0)
MCV: 77.3 fL — ABNORMAL LOW (ref 80.0–100.0)
Monocytes Absolute: 1.1 10*3/uL — ABNORMAL HIGH (ref 0.1–1.0)
Monocytes Relative: 11 %
Neutro Abs: 7 10*3/uL (ref 1.7–7.7)
Neutrophils Relative %: 70 %
Platelets: 253 10*3/uL (ref 150–400)
RBC: 4.84 MIL/uL (ref 4.22–5.81)
RDW: 14.6 % (ref 11.5–15.5)
WBC: 10.2 10*3/uL (ref 4.0–10.5)
nRBC: 0 % (ref 0.0–0.2)

## 2020-05-04 LAB — RESP PANEL BY RT PCR (RSV, FLU A&B, COVID)
Influenza A by PCR: NEGATIVE
Influenza B by PCR: NEGATIVE
Respiratory Syncytial Virus by PCR: NEGATIVE
SARS Coronavirus 2 by RT PCR: NEGATIVE

## 2020-05-04 LAB — HIV ANTIBODY (ROUTINE TESTING W REFLEX): HIV Screen 4th Generation wRfx: NONREACTIVE

## 2020-05-04 LAB — CBG MONITORING, ED: Glucose-Capillary: 219 mg/dL — ABNORMAL HIGH (ref 70–99)

## 2020-05-04 LAB — GLUCOSE, CAPILLARY
Glucose-Capillary: 141 mg/dL — ABNORMAL HIGH (ref 70–99)
Glucose-Capillary: 209 mg/dL — ABNORMAL HIGH (ref 70–99)
Glucose-Capillary: 102 mg/dL — ABNORMAL HIGH (ref 70–99)

## 2020-05-04 LAB — C-REACTIVE PROTEIN: CRP: 0.9 mg/dL (ref <1.0)

## 2020-05-04 LAB — SEDIMENTATION RATE: Sed Rate: 15 mm/hr (ref 0–16)

## 2020-05-04 MED ORDER — SODIUM CHLORIDE 0.9 % IV SOLN
1.0000 g | Freq: Two times a day (BID) | INTRAVENOUS | Status: DC
  Administered 2020-05-04 – 2020-05-05 (×3): 1 g via INTRAVENOUS
  Filled 2020-05-04 (×3): qty 10

## 2020-05-04 MED ORDER — VANCOMYCIN HCL 1750 MG/350ML IV SOLN
1750.0000 mg | Freq: Once | INTRAVENOUS | Status: AC
  Administered 2020-05-04: 1750 mg via INTRAVENOUS
  Filled 2020-05-04: qty 350

## 2020-05-04 MED ORDER — ACETAMINOPHEN 325 MG PO TABS: 650.0000 mg | ORAL_TABLET | Freq: Four times a day (QID) | ORAL | Status: DC | PRN

## 2020-05-04 MED ORDER — ONDANSETRON HCL 4 MG PO TABS: 4.0000 mg | ORAL_TABLET | Freq: Four times a day (QID) | ORAL | Status: DC | PRN

## 2020-05-04 MED ORDER — METFORMIN HCL ER 750 MG PO TB24
1500.0000 mg | ORAL_TABLET | Freq: Two times a day (BID) | ORAL | Status: DC
  Administered 2020-05-04 – 2020-05-16 (×19): 1500 mg via ORAL
  Filled 2020-05-04 (×23): qty 2

## 2020-05-04 MED ORDER — ONDANSETRON HCL 4 MG/2ML IJ SOLN: 4.0000 mg | Freq: Four times a day (QID) | INTRAMUSCULAR | Status: DC | PRN

## 2020-05-04 MED ORDER — IOHEXOL 300 MG/ML  SOLN
75.0000 mL | Freq: Once | INTRAMUSCULAR | Status: AC | PRN
  Administered 2020-05-04: 75 mL via INTRAVENOUS

## 2020-05-04 MED ORDER — VANCOMYCIN HCL IN DEXTROSE 1-5 GM/200ML-% IV SOLN
1000.0000 mg | Freq: Two times a day (BID) | INTRAVENOUS | Status: DC
  Administered 2020-05-04 – 2020-05-05 (×2): 1000 mg via INTRAVENOUS
  Filled 2020-05-04 (×2): qty 200

## 2020-05-04 MED ORDER — ACETAMINOPHEN 650 MG RE SUPP: 650.0000 mg | Freq: Four times a day (QID) | RECTAL | Status: DC | PRN

## 2020-05-04 MED ORDER — HYDROCODONE-ACETAMINOPHEN 5-325 MG PO TABS: 1.0000 | ORAL_TABLET | ORAL | Status: DC | PRN

## 2020-05-04 MED ORDER — LORATADINE 10 MG PO TABS
10.0000 mg | ORAL_TABLET | Freq: Every day | ORAL | Status: DC
  Administered 2020-05-04 – 2020-05-16 (×11): 10 mg via ORAL
  Filled 2020-05-04 (×11): qty 1

## 2020-05-04 NOTE — ED Provider Notes (Signed)
[facility] EMERGENCY DEPARTMENT Provider Note   CSN: [id number] Arrival date & time: 05/04/20  0258     History Chief Complaint  Patient presents with  . Post-op Problem    Harold Torres is a 44 y.o. male.  HPI   Patient is a 44 year old male with a medical history as noted below.  [place] interpreter used to obtain history.  Patient states that he had surgery to the scalp about 2 weeks ago.  Please see most recent surgical note below.  He has been applying bacitracin to the site and taking oxycodone as needed.  Denies any significant pain currently.  States that about 1 to 2 days ago he began noting yellow purulent discharge from the site once again.  It appears that patient had a previous infection at the site and was given IV antibiotics for 2 weeks.  He has no other complaints at this time.  No fevers, chills, chest pain, shortness of breath, body aches, nausea, vomiting, abdominal pain.  Note from surgery (Dr. Ashok Pall) on October 14:  Harold Torres is a 44 y.o. male Who was initially taken to the operating room for an uncomplicated craniotomy for a rhabdoid meningioma. Upon return he had a custom cranioplasty flap placed. Post op he developed an infection in the wound. I removed the peek cranioplasty and he was treated with abx for two weeks. I then replaced the flap, but there was a significant gap in his wound. I was able to approximate it but the closure was slightly tenuous. Upon return to the office today, the wound opened in the middle of the incision approximately 1.5cm. He is here today for wound closure.     Past Medical History:  Diagnosis Date  . 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 Generalized skin cysts    back x 2, left leg, hand  . Hepatitis    Hepatitis B   . Meningioma ([place])   . Seasonal allergies     Patient Active Problem List   Diagnosis Date Noted  . Hep B w/o coma 03/24/2020  . Postoperative wound  infection 03/21/2020  . Meningioma ([place]) 03/14/2020  . Meningioma determined by biopsy of brain ([place]) 02/08/2020  . 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 CRANIOPLASTY N/A 03/14/2020   Procedure: CRANIOPLASTY;  Surgeon: Ashok Pall, MD;  Location: [place];  Service: Neurosurgery;  Laterality: N/A;  . CRANIOPLASTY N/A 03/21/2020   Procedure: Cranial wound incision and drainage;  Surgeon: Ashok Pall, MD;  Location: [place];  Service: Neurosurgery;  Laterality: N/A;  Cranial wound incision and drainage  . CRANIOPLASTY N/A 04/04/2020   Procedure: Replacement of prosthetic bone flap;  Surgeon: Ashok Pall, MD;  Location: [place];  Service: Neurosurgery;  Laterality: N/A;  . CRANIOTOMY Bilateral 01/31/2020   Procedure: OCCIPITAL CRANIOTOMY FOR MASS;  Surgeon: Ashok Pall, MD;  Location: [place];  Service: Neurosurgery;  Laterality: Bilateral;  . CRANIOTOMY N/A 02/08/2020   Procedure: CRANIECTOMY FOR TUMOR;  Surgeon: Ashok Pall, MD;  Location: [place];  Service: Neurosurgery;  Laterality: N/A;  CRANIECTOMY FOR TUMOR  . WOUND EXPLORATION N/A 04/17/2020   Procedure: Scalp Wound Repair;  Surgeon: Ashok Pall, MD;  Location: [place];  Service: Neurosurgery;  Laterality: N/A;  3C       No family history on file.  Social History   Tobacco Use  . Smoking status: Former Smoker  Types: Cigarettes  . Smokeless tobacco: Never Used  . Tobacco comment: at age [age]  Vaping Use  . Vaping Use: Never used  Substance Use Topics  . Alcohol use: Never  . Drug use: Never    Home Medications Prior to Admission medications   Medication Sig Start Date End Date Taking? Authorizing Provider  cetirizine (ZYRTEC) 10 MG tablet Take 10 mg by mouth daily. 04/08/20   [provider]  metFORMIN (GLUCOPHAGE-XR) 500 MG 24 hr tablet Take 1,500 mg by mouth in the morning and at bedtime.  11/08/19   [provider]    Allergies    Patient has no known  allergies.  Review of Systems   Review of Systems  All other systems reviewed and are negative. Ten systems reviewed and are negative for acute change, except as noted in the HPI.   Physical Exam Updated Vital Signs BP 103/80   Pulse 82   Temp 98.8 F (37.1 C) (Oral)   Resp 17   SpO2 99%   Physical Exam Vitals and nursing note reviewed.  Constitutional:      General: He is not in acute distress.    Appearance: Normal appearance. He is not ill-appearing, toxic-appearing or diaphoretic.  HENT:     Head: Normocephalic.     Comments: Large well approximated surgical wound noted to the scalp.  Please see images below.  Yellow discharge noted from the incision sites.  Minimal tenderness overlying the site.    Right Ear: External ear normal.     Left Ear: External ear normal.     Nose: Nose normal.     Mouth/Throat:     Mouth: Mucous membranes are moist.     Pharynx: Oropharynx is clear. No oropharyngeal exudate or posterior oropharyngeal erythema.  Eyes:     Extraocular Movements: Extraocular movements intact.  Cardiovascular:     Rate and Rhythm: Normal rate and regular rhythm.     Pulses: Normal pulses.     Heart sounds: Normal heart sounds. No murmur heard.  No friction rub. No gallop.      Comments: Regular rate and rhythm.  No tachycardia. Pulmonary:     Effort: Pulmonary effort is normal. No respiratory distress.     Breath sounds: Normal breath sounds. No stridor. No wheezing, rhonchi or rales.     Comments: Lungs are clear to auscultation bilaterally. Abdominal:     General: Abdomen is flat.     Palpations: Abdomen is soft.     Tenderness: There is no abdominal tenderness.     Comments: Abdomen is soft and nontender.  Musculoskeletal:        General: Normal range of motion.     Cervical back: Normal range of motion and neck supple. No tenderness.  Skin:    General: Skin is warm and dry.  Neurological:     General: No focal deficit present.     Mental Status: He  is alert and oriented to person, place, and time.  Psychiatric:        Mood and Affect: Mood normal.        Behavior: Behavior normal.           ED Results / Procedures / Treatments   Labs (all labs ordered are listed, but only abnormal results are displayed) Labs Reviewed  COMPREHENSIVE METABOLIC PANEL - Abnormal; Notable for the following components:      Result Value   Glucose, Bld 210 (*)    All other components  within normal limits  CBC WITH DIFFERENTIAL/PLATELET - Abnormal; Notable for the following components:   Hemoglobin 11.5 (*)    HCT 37.4 (*)    MCV 77.3 (*)    MCH 23.8 (*)    Monocytes Absolute 1.1 (*)    All other components within normal limits  CBG MONITORING, ED - Abnormal; Notable for the following components:   Glucose-Capillary 219 (*)    All other components within normal limits    EKG None  Radiology No results found.  Procedures Procedures (including critical care time)  Medications Ordered in ED Medications  vancomycin (VANCOREADY) IVPB 1750 mg/350 mL (has no administration in time range)  vancomycin (VANCOCIN) IVPB 1000 mg/200 mL premix (has no administration in time range)    ED Course  I have reviewed the triage vital signs and the nursing notes.  Pertinent labs & imaging results that were available during my care of the patient were reviewed by me and considered in my medical decision making (see chart for details).  Clinical Course as of May 04 1040  Sun May 04, 2020  6237 Similar to baseline values.  Hemoglobin(!): 11.5 [LJ]  0954 Patient discussed with the neurosurgery team.  Recommended a CT scan head with and without contrast.  Neurosurgery is going to evaluate the patient.   [LJ]    Clinical Course User Index [LJ] Moody Bruins   MDM Rules/Calculators/A&P                          Patient is a 44 year old male who presents to the emergency department with increasing purulent drainage from a surgical incision  to the scalp.  Patient had a previous craniotomy to the region as well as a previous infection that had been treated with IV antibiotics for 2 weeks in early October.  Wound revision on October 14.  Labs today without leukocytosis.  Electrolytes within normal limits.  Hemoglobin stable.  Patient was discussed with neurosurgery.  He was evaluated by Shelba Flake, NP.  Requested that I order a CT of the head with and without contrast.  This has been done.  Patient will be admitted for IV antibiotics as well as a likely wound revision.  Final Clinical Impression(s) / ED Diagnoses Final diagnoses:  Surgical wound infection   Rx / DC Orders ED Discharge Orders    None       Rayna Sexton, PA-C 05/04/20 [place], MD 05/07/20 [phone]

## 2020-05-04 NOTE — Progress Notes (Signed)
Pharmacy Antibiotic Note  Harold Torres is a 44 y.o. male admitted on 05/04/2020 with craniotomy incision infection, on abx x 2wks PTA.  Pharmacy has been consulted for vancomycin dosing.  Ceftriaxone per MD  Plan: Vancomycin 1750 mg IV x 1, then 1000 mg IV every 12 hours Monitor renal function, Cx and clinical progression to narrow Vancomycin trough at steady state     Temp (24hrs), Avg:98.8 F (37.1 C), Min:98.8 F (37.1 C), Max:98.8 F (37.1 C)  Recent Labs  Lab 05/04/20 0454  WBC 10.2  CREATININE 0.73    CrCl cannot be calculated (Unknown ideal weight.).    No Known Allergies  Bertis Ruddy, PharmD Clinical Pharmacist ED Pharmacist Phone # [phone] 05/04/2020 10:37 AM

## 2020-05-04 NOTE — ED Triage Notes (Signed)
Ipad translator was used to complete triage.  Pt reports surgery on his scalp two weeks ago, yesterday he noticed the pain returned and his wound has puss coming from it.  He did have one bout of emesis, no fever, chills or other complaints.

## 2020-05-04 NOTE — H&P (Signed)
Harold Torres is an 44 y.o. male.   HPI:  44 year old male comes in today to the ED with complaints of his scalp incision draining pus.  He was admitted on October 14 with a known infection of his craniotomy incision.  He had been placed on antibiotics for 2 weeks after Dr. Christella Noa replaced his bone flap.  He was seen in the office by Dr. Christella Noa who then readmitted him because his wound had opened.  He denies any fever or chills.  History is difficult as he does not speak good [place].  Past Medical History:  Diagnosis Date  . 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)    type 2  . Generalized skin cysts    back x 2, left leg, hand  . Hepatitis    Hepatitis B   . Meningioma ([place])   . Seasonal allergies     Past Surgical History:  Procedure Laterality Date  . CRANIOPLASTY N/A 03/14/2020   Procedure: CRANIOPLASTY;  Surgeon: Ashok Pall, MD;  Location: [facility];  Service: Neurosurgery;  Laterality: N/A;  . CRANIOPLASTY N/A 03/21/2020   Procedure: Cranial wound incision and drainage;  Surgeon: Ashok Pall, MD;  Location: [facility];  Service: Neurosurgery;  Laterality: N/A;  Cranial wound incision and drainage  . CRANIOPLASTY N/A 04/04/2020   Procedure: Replacement of prosthetic bone flap;  Surgeon: Ashok Pall, MD;  Location: [place];  Service: Neurosurgery;  Laterality: N/A;  . CRANIOTOMY Bilateral 01/31/2020   Procedure: OCCIPITAL CRANIOTOMY FOR MASS;  Surgeon: Ashok Pall, MD;  Location: [facility];  Service: Neurosurgery;  Laterality: Bilateral;  . CRANIOTOMY N/A 02/08/2020   Procedure: CRANIECTOMY FOR TUMOR;  Surgeon: Ashok Pall, MD;  Location: [facility];  Service: Neurosurgery;  Laterality: N/A;  CRANIECTOMY FOR TUMOR  . WOUND EXPLORATION N/A 04/17/2020   Procedure: Scalp Wound Repair;  Surgeon: Ashok Pall, MD;  Location: [place];  Service: Neurosurgery;  Laterality: N/A;  3C    No Known Allergies  Social History   Tobacco Use  . Smoking status: Former Smoker    Types: Cigarettes  .  Smokeless tobacco: Never Used  . Tobacco comment: at age [age]  Substance Use Topics  . Alcohol use: Never    History reviewed. No pertinent family history.   Review of Systems  Positive ROS: as above All other systems have been reviewed and were otherwise negative with the exception of those mentioned in the HPI and as above.  Objective: Vital signs in last 24 hours: Temp:  [98.8 F (37.1 C)] 98.8 F (37.1 C) (10/31 0647) Pulse Rate:  [82-92] 82 (10/31 0647) Resp:  [17-18] 17 (10/31 0647) BP: (103-112)/(79-80) 103/80 (10/31 0647) SpO2:  [97 %-99 %] 99 % (10/31 0647)  General Appearance: Alert, cooperative, no distress, appears stated age Head: Normocephalic, without obvious abnormality, atraumatic, craniotomy incision draining yellow pus Eyes: PERRL, conjunctiva/corneas clear, EOM's intact, fundi benign, both eyes      Lungs:  respirations unlabored Heart: Regular rate and rhythm Pulses: 2+ and symmetric all extremities Skin: Skin color, texture, turgor normal, no rashes or lesions  NEUROLOGIC:   Mental status: A&O x4, no aphasia, good attention span, Memory and fund of knowledge Motor Exam - grossly normal, normal tone and bulk Sensory Exam - grossly normal Reflexes: symmetric, no pathologic reflexes, No Hoffman's, No clonus Coordination - grossly normal Gait - not tested Balance - not tested Cranial Nerves: I: smell Not tested  II: visual acuity  OS: *na   OD: na  II:  visual fields Full to confrontation  II: pupils Equal, round, reactive to light  III,VII: ptosis None  III,IV,VI: extraocular muscles  Full ROM  V: mastication Normal  V: facial light touch sensation  Normal  V,VII: corneal reflex  Present  VII: facial muscle function - upper  Normal  VII: facial muscle function - lower Normal  VIII: hearing Not tested  IX: soft palate elevation  Normal  IX,X: gag reflex Present  XI: trapezius strength  5/5  XI: sternocleidomastoid strength 5/5  XI: neck  flexion strength  5/5  XII: tongue strength  Normal    Data Review Lab Results  Component Value Date   WBC 10.2 05/04/2020   HGB 11.5 (L) 05/04/2020   HCT 37.4 (L) 05/04/2020   MCV 77.3 (L) 05/04/2020   PLT 253 05/04/2020   Lab Results  Component Value Date   NA 138 05/04/2020   K 3.8 05/04/2020   CL 104 05/04/2020   CO2 25 05/04/2020   BUN 17 05/04/2020   CREATININE 0.73 05/04/2020   GLUCOSE 210 (H) 05/04/2020   Lab Results  Component Value Date   INR 1.0 02/07/2020      Assessment/Plan: 44 year old male presents to the ED today with a known craniotomy wound infection.  He states that he started draining more pus recently.  Has been on antibiotics for 2 weeks at the beginning of October.  He had a wound revision on October 14.  We will get a CT of his head with and without contrast.  We will also place him on IV vancomycin and Rocephin.  Will likely need to get plastics involved for wound revision.   Ocie Cornfield Priest Lockridge 05/04/2020 10:14 AM

## 2020-05-04 NOTE — ED Notes (Signed)
Patient transported to CT 

## 2020-05-05 ENCOUNTER — Ambulatory Visit: Payer: 59

## 2020-05-05 LAB — GLUCOSE, CAPILLARY
Glucose-Capillary: 114 mg/dL — ABNORMAL HIGH (ref 70–99)
Glucose-Capillary: 198 mg/dL — ABNORMAL HIGH (ref 70–99)
Glucose-Capillary: 145 mg/dL — ABNORMAL HIGH (ref 70–99)
Glucose-Capillary: 246 mg/dL — ABNORMAL HIGH (ref 70–99)

## 2020-05-05 MED ORDER — VANCOMYCIN HCL IN DEXTROSE 1-5 GM/200ML-% IV SOLN
1000.0000 mg | Freq: Three times a day (TID) | INTRAVENOUS | Status: AC
  Administered 2020-05-05 – 2020-05-14 (×27): 1000 mg via INTRAVENOUS
  Filled 2020-05-05 (×28): qty 200

## 2020-05-05 MED ORDER — SODIUM CHLORIDE 0.9 % IV SOLN
2.0000 g | Freq: Two times a day (BID) | INTRAVENOUS | Status: DC
  Administered 2020-05-05 – 2020-05-16 (×22): 2 g via INTRAVENOUS
  Filled 2020-05-05: qty 20
  Filled 2020-05-05 (×8): qty 2
  Filled 2020-05-05: qty 20
  Filled 2020-05-05: qty 2
  Filled 2020-05-05: qty 20
  Filled 2020-05-05 (×2): qty 2
  Filled 2020-05-05 (×2): qty 20
  Filled 2020-05-05 (×4): qty 2
  Filled 2020-05-05 (×2): qty 20
  Filled 2020-05-05: qty 2

## 2020-05-05 NOTE — Progress Notes (Signed)
Pharmacy Antibiotic Note  Harold Torres is a 44 y.o. male admitted on 05/04/2020 with concern for craniotomy incision site infection. CT concerning for both superficial and "deep" infection.  Pharmacy has been consulted for Vancomycin dosing.  Will increase the Vancomycin dose today to target CNS concentration, will also adjust Rocephin dosing to 2g/12h.  Since the patient received IV contrast on 10/31, continued on Metformin, and Vancomycin - will obtain a BMET with AM labs.  Will hold Metformin doses until 11/3 AM.  Plan: - Adjust Vancomycin to 1g IV every 8 hours - Adjust Rocephin to 2g IV every 12 hours - Hold Metformin until 11/3 AM due to recent IV contrast  Weight: 89.4 kg (197 lb 1.5 oz)  Temp (24hrs), Avg:98.1 F (36.7 C), Min:97.7 F (36.5 C), Max:98.8 F (37.1 C)  Recent Labs  Lab 05/04/20 0454 05/04/20 1220  WBC 10.2 9.0  CREATININE 0.73  --     Estimated Creatinine Clearance: 123.7 mL/min (by C-G formula based on SCr of 0.73 mg/dL).    No Known Allergies  Antimicrobials this admission: Vanc 10/31 >> Rocephin 10/31 >>  Microbiology results: 10/31 Flu/COVID/RSV >> neg  Thank you for allowing pharmacy to be a part of this patient's care.  Alycia Rossetti, PharmD, BCPS Clinical Pharmacist Clinical phone for 05/05/2020: [phone] 05/05/2020 2:46 PM   **Pharmacist phone directory can now be found on [email or web address] ([email or web address]).  Listed under [place].

## 2020-05-06 ENCOUNTER — Telehealth: Payer: Self-pay | Admitting: Plastic Surgery

## 2020-05-06 ENCOUNTER — Ambulatory Visit: Payer: 59

## 2020-05-06 LAB — BASIC METABOLIC PANEL
Anion gap: 10 (ref 5–15)
BUN: 19 mg/dL (ref 6–20)
CO2: 25 mmol/L (ref 22–32)
Calcium: 8.6 mg/dL — ABNORMAL LOW (ref 8.9–10.3)
Chloride: 103 mmol/L (ref 98–111)
Creatinine, Ser: 0.79 mg/dL (ref 0.61–1.24)
GFR, Estimated: >60 mL/min (ref >60)
Glucose, Bld: 239 mg/dL — ABNORMAL HIGH (ref 70–99)
Potassium: 3.4 mmol/L — ABNORMAL LOW (ref 3.5–5.1)
Sodium: 138 mmol/L (ref 135–145)

## 2020-05-06 LAB — GLUCOSE, CAPILLARY: Glucose-Capillary: 214 mg/dL — ABNORMAL HIGH (ref 70–99)

## 2020-05-06 NOTE — Progress Notes (Signed)
Rad Onc aware of pt being hospitalized. We were planning MRI as outpt this week and subsequent treatment with radiation but given his current infection, will plan to proceed in a few weeks after cleared by Dr. Christella Noa.     Carola Rhine, PAC

## 2020-05-06 NOTE — Consult Note (Signed)
Reason for Consult: Infected scalp incision Referring Physician: Dr. Anibal Henderson Harold Torres is an 44 y.o. male.  HPI: Patient underwent an occipital craniotomy on 01/31/20 to remove a mass underneath the scalp that has eroded through the skull and is adjacent to the brain. 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  On 02/07/20 he was taken to the OR for removal of skull with tumor involvement. On 03/14/20 he underwent a cranioplasty with a custom peek implant. On 03/21/20 he presented to Dr. Lacy Duverney office with purulent drainage from the scalp incision. Patient was taken to the OR that afternoon for cranioplasty removal and debridement.  After undergoing abx treatment for 2 weeks, he returned to the OR on 04/04/20 for reimplantation of peek graft with Dr. Christella Noa. Closure was difficult due to a significant gap in his wound. On 10/14 he presented to Dr. Christella Noa after a wound dihiscence in the middle of the incisions. (~1.5 cm) and underwent debridement and primary closure of the wound.  On 10/31 the patient presented to the ED with an infection of his craniotomy incision. He was placed on IV Vancomycin and Rocephin by Neurosurgery.   Today he is sleeping in bed. Easily aroused, but not very talkative. Patient speaks limited [place]. Nurse was present and reports that patient states he's feeling very tired today. Reports that nursing staff reported to her that patient ate and slep well this past weekend. Denies pain, fevers, or chills. Incision is draining purulent drainage. Palpable fluid on right lateral area of incision.    Past Medical History:  Diagnosis Date  . Diabetes mellitus without complication ([place])    type 2  . Generalized skin cysts    back x 2, left leg, hand  . Hepatitis    Hepatitis B   . Meningioma ([place])   . Seasonal allergies     Past Surgical History:  Procedure Laterality Date  . CRANIOPLASTY N/A 03/14/2020   Procedure: CRANIOPLASTY;  Surgeon: Ashok Pall,  MD;  Location: [place];  Service: Neurosurgery;  Laterality: N/A;  . CRANIOPLASTY N/A 03/21/2020   Procedure: Cranial wound incision and drainage;  Surgeon: Ashok Pall, MD;  Location: [place];  Service: Neurosurgery;  Laterality: N/A;  Cranial wound incision and drainage  . CRANIOPLASTY N/A 04/04/2020   Procedure: Replacement of prosthetic bone flap;  Surgeon: Ashok Pall, MD;  Location: [facility];  Service: Neurosurgery;  Laterality: N/A;  . CRANIOTOMY Bilateral 01/31/2020   Procedure: OCCIPITAL CRANIOTOMY FOR MASS;  Surgeon: Ashok Pall, MD;  Location: [facility];  Service: Neurosurgery;  Laterality: Bilateral;  . CRANIOTOMY N/A 02/08/2020   Procedure: CRANIECTOMY FOR TUMOR;  Surgeon: Ashok Pall, MD;  Location: [facility];  Service: Neurosurgery;  Laterality: N/A;  CRANIECTOMY FOR TUMOR  . WOUND EXPLORATION N/A 04/17/2020   Procedure: Scalp Wound Repair;  Surgeon: Ashok Pall, MD;  Location: [facility];  Service: Neurosurgery;  Laterality: N/A;  3C    History reviewed. No pertinent family history.  Social History:  reports that he has quit smoking. His smoking use included cigarettes. He has never used smokeless tobacco. He reports that he does not drink alcohol and does not use drugs.  Allergies: No Known Allergies  Medications: I have reviewed the patient's current medications.  Results for orders placed or performed during the hospital encounter of 05/04/20 (from the past 48 hour(s))  Resp Panel by RT PCR (RSV, Flu A&B, Covid) - Nasopharyngeal Swab     Status: None   Collection Time:  05/04/20 11:34 AM   Specimen: Nasopharyngeal Swab  Result Value Ref Range   SARS Coronavirus 2 by RT PCR NEGATIVE NEGATIVE    Comment: (NOTE) SARS-CoV-2 target nucleic acids are NOT DETECTED.  The SARS-CoV-2 RNA is generally detectable in upper respiratoy specimens during the acute phase of infection. The lowest concentration of SARS-CoV-2 viral copies this assay can detect is 131 copies/mL. A negative result does  not preclude SARS-Cov-2 infection and should not be used as the sole basis for treatment or other patient management decisions. A negative result may occur with  improper specimen collection/handling, submission of specimen other than nasopharyngeal swab, presence of viral mutation(s) within the areas targeted by this assay, and inadequate number of viral copies (<131 copies/mL). A negative result must be combined with clinical observations, patient history, and epidemiological information. The expected result is Negative.  Fact Sheet for Patients:  PinkCheek.be  Fact Sheet for Healthcare Providers:  GravelBags.it  This test is no t yet approved or cleared by the [place] FDA and  has been authorized for detection and/or diagnosis of SARS-CoV-2 by FDA under an Emergency Use Authorization (EUA). This EUA will remain  in effect (meaning this test can be used) for the duration of the COVID-19 declaration under Section 564(b)(1) of the Act, 21 U.S.C. section 360bbb-3(b)(1), unless the authorization is terminated or revoked sooner.     Influenza A by PCR NEGATIVE NEGATIVE   Influenza B by PCR NEGATIVE NEGATIVE    Comment: (NOTE) The Xpert Xpress SARS-CoV-2/FLU/RSV assay is intended as an aid in  the diagnosis of influenza from Nasopharyngeal swab specimens and  should not be used as a sole basis for treatment. Nasal washings and  aspirates are unacceptable for Xpert Xpress SARS-CoV-2/FLU/RSV  testing.  Fact Sheet for Patients: PinkCheek.be  Fact Sheet for Healthcare Providers: GravelBags.it  This test is not yet approved or cleared by the [place] FDA and  has been authorized for detection and/or diagnosis of SARS-CoV-2 by  FDA under an Emergency Use Authorization (EUA). This EUA will remain  in effect (meaning this test can be used) for the duration of the   Covid-19 declaration under Section 564(b)(1) of the Act, 21  U.S.C. section 360bbb-3(b)(1), unless the authorization is  terminated or revoked.    Respiratory Syncytial Virus by PCR NEGATIVE NEGATIVE    Comment: (NOTE) Fact Sheet for Patients: PinkCheek.be  Fact Sheet for Healthcare Providers: GravelBags.it  This test is not yet approved or cleared by the [place] FDA and  has been authorized for detection and/or diagnosis of SARS-CoV-2 by  FDA under an Emergency Use Authorization (EUA). This EUA will remain  in effect (meaning this test can be used) for the duration of the  COVID-19 declaration under Section 564(b)(1) of the Act, 21 U.S.C.  section 360bbb-3(b)(1), unless the authorization is terminated or  revoked. Performed at [facility], [place] [address], [place], [place] [address]   HIV Antibody (routine testing w rflx)     Status: None   Collection Time: 05/04/20 12:20 PM  Result Value Ref Range   HIV Screen 4th Generation wRfx Non Reactive Non Reactive    Comment: Performed at [facility], [place] [address] [place] [address]  CBC     Status: Abnormal   Collection Time: 05/04/20 12:20 PM  Result Value Ref Range   WBC 9.0 4.0 - 10.5 K/uL   RBC 4.55 4.22 - 5.81 MIL/uL   Hemoglobin 10.6 (L) 13.0 -  17.0 g/dL   HCT 34.9 (L) 39 - 52 %   MCV 76.7 (L) 80.0 - 100.0 fL   MCH 23.3 (L) 26.0 - 34.0 pg   MCHC 30.4 30.0 - 36.0 g/dL   RDW 14.7 11.5 - 15.5 %   Platelets 179 150 - 400 K/uL    Comment: REPEATED TO VERIFY   nRBC 0.0 0.0 - 0.2 %    Comment: Performed at [facility], [place] [address] [place], [place] [address]  Sedimentation rate     Status: None   Collection Time: 05/04/20 12:20 PM  Result Value Ref Range   Sed Rate 15 0 - 16 mm/hr    Comment: Performed at [facility] [address], [place], [place] [address]  C-reactive protein     Status: None   Collection Time:  05/04/20 12:20 PM  Result Value Ref Range   CRP 0.9 <1.0 mg/dL    Comment: Performed at [facility] [address] [place], [place] [address]  Glucose, capillary     Status: Abnormal   Collection Time: 05/04/20  2:17 PM  Result Value Ref Range   Glucose-Capillary 141 (H) 70 - 99 mg/dL    Comment: Glucose reference range applies only to samples taken after fasting for at least 8 hours.  Glucose, capillary     Status: Abnormal   Collection Time: 05/04/20  4:23 PM  Result Value Ref Range   Glucose-Capillary 209 (H) 70 - 99 mg/dL    Comment: Glucose reference range applies only to samples taken after fasting for at least 8 hours.  Glucose, capillary     Status: Abnormal   Collection Time: 05/04/20 10:42 PM  Result Value Ref Range   Glucose-Capillary 102 (H) 70 - 99 mg/dL    Comment: Glucose reference range applies only to samples taken after fasting for at least 8 hours.  Glucose, capillary     Status: Abnormal   Collection Time: 05/05/20  6:37 AM  Result Value Ref Range   Glucose-Capillary 114 (H) 70 - 99 mg/dL    Comment: Glucose reference range applies only to samples taken after fasting for at least 8 hours.  Glucose, capillary     Status: Abnormal   Collection Time: 05/05/20 11:11 AM  Result Value Ref Range   Glucose-Capillary 198 (H) 70 - 99 mg/dL    Comment: Glucose reference range applies only to samples taken after fasting for at least 8 hours.  Glucose, capillary     Status: Abnormal   Collection Time: 05/05/20  4:57 PM  Result Value Ref Range   Glucose-Capillary 145 (H) 70 - 99 mg/dL    Comment: Glucose reference range applies only to samples taken after fasting for at least 8 hours.  Glucose, capillary     Status: Abnormal   Collection Time: 05/05/20  9:15 PM  Result Value Ref Range   Glucose-Capillary 246 (H) 70 - 99 mg/dL    Comment: Glucose reference range applies only to samples taken after fasting for at least 8 hours.   Comment 1 Notify RN    Comment 2  Document in Chart   Basic metabolic panel     Status: Abnormal   Collection Time: 05/06/20  4:42 AM  Result Value Ref Range   Sodium 138 135 - 145 mmol/L   Potassium 3.4 (L) 3.5 - 5.1 mmol/L   Chloride 103 98 - 111 mmol/L   CO2 25 22 - 32 mmol/L   Glucose,  Bld 239 (H) 70 - 99 mg/dL    Comment: Glucose reference range applies only to samples taken after fasting for at least 8 hours.   BUN 19 6 - 20 mg/dL   Creatinine, Ser 0.79 0.61 - 1.24 mg/dL   Calcium 8.6 (L) 8.9 - 10.3 mg/dL   GFR, Estimated >60 >60 mL/min    Comment: (NOTE) Calculated using the CKD-EPI Creatinine Equation (2021)    Anion gap 10 5 - 15    Comment: Performed at [facility] [address] [place], [place] [address]  Glucose, capillary     Status: Abnormal   Collection Time: 05/06/20  8:19 AM  Result Value Ref Range   Glucose-Capillary 214 (H) 70 - 99 mg/dL    Comment: Glucose reference range applies only to samples taken after fasting for at least 8 hours.   Comment 1 Notify RN    Comment 2 Document in Chart     CT Head W or Wo Contrast  Result Date: 05/04/2020 CLINICAL DATA:  Wound dehiscence EXAM: CT HEAD WITHOUT AND WITH CONTRAST TECHNIQUE: Contiguous axial images were obtained from the base of the skull through the vertex without and with intravenous contrast CONTRAST:  54mL OMNIPAQUE IOHEXOL 300 MG/ML  SOLN COMPARISON:  02/11/2020 FINDINGS: Brain: Vertex craniectomy eccentric to the right is again identified with interval cranioplasty. There is a fluid collection about the cranioplasty best seen on sagittal images measuring approximately 1.7 x 6.5 x 5 cm (AP by TV by CC). This is contiguous with thickened, irregular skin surface. No acute intracranial hemorrhage or mass effect. No evidence of parenchymal edema at the vertex. No new loss of gray-white differentiation. Ventricles are stable in size. Vascular: Superior sagittal sinus is patent. Skull: As above.  Otherwise unremarkable. Sinuses/Orbits: No  acute finding. Other: None. IMPRESSION: Fluid collection superficial and deep to interval cranioplasty and contiguous with thickened, irregular skin surface. Presumably infected given history. No new erosive changes of the adjacent calvarium. Electronically Signed   By: Macy Mis M.D.   On: 05/04/2020 12:35    Review of Systems  Constitutional: Positive for malaise/fatigue. Negative for chills and fever.  Respiratory: Negative for shortness of breath.   Cardiovascular: Negative for chest pain.  Skin: Negative for itching and rash.  Neurological: Negative for headaches.   Blood pressure 99/75, pulse 71, temperature 97.7 F (36.5 C), temperature source Oral, resp. rate 18, weight 89.4 kg, SpO2 95 %. Physical Exam Constitutional:      General: He is sleeping. He is not in acute distress.    Appearance: Normal appearance. He is normal weight.  HENT:     Head: Normocephalic and atraumatic.     Comments: Scalp incision has purulent drainage. Palpable fluid at right lateral end of incision.  Cardiovascular:     Rate and Rhythm: Normal rate.  Pulmonary:     Effort: Pulmonary effort is normal.  Neurological:     Mental Status: He is easily aroused.     Assessment/Plan: Patient has purulent drainage from the scalp incisions.  Discussed with plastics team. Will reach out to Neurosurgery team and remain ready to assist as needed.   Threasa Heads, PA-C 05/06/2020, 11:24 AM

## 2020-05-06 NOTE — Progress Notes (Signed)
Patient ID: Harold Torres, male   DOB: 1975/12/15, 44 y.o.   MRN: [id number] BP 113/67 (BP Location: Right Arm)   Pulse 88   Temp 98 F (36.7 C) (Oral)   Resp 18   Wt 89.4 kg   SpO2 100%   BMI 32.80 kg/m  Alert and oriented x 4 Speech is clear and fluent Purulent drainage Will remove implant tomorrow. Consult ID after culturing wound in the or

## 2020-05-06 NOTE — Telephone Encounter (Signed)
Janett Billow, from [facility], called on behalf of Dr. Christella Noa to request Dr. Claudia Desanctis to visit patient in hospital. Patient has another wound infection of the scalp.

## 2020-05-07 ENCOUNTER — Ambulatory Visit: Payer: 59

## 2020-05-07 ENCOUNTER — Encounter (HOSPITAL_COMMUNITY)
Admission: EM | Disposition: A | Discharge status: Home Health | Payer: Self-pay | Source: Home / Self Care | Attending: Neurosurgery

## 2020-05-07 ENCOUNTER — Encounter (HOSPITAL_COMMUNITY): Payer: Self-pay | Admitting: Neurosurgery

## 2020-05-07 ENCOUNTER — Inpatient Hospital Stay (HOSPITAL_COMMUNITY): Payer: 59 | Admitting: Anesthesiology

## 2020-05-07 HISTORY — PX: CRANIOPLASTY: SHX1407

## 2020-05-07 LAB — GLUCOSE, CAPILLARY
Glucose-Capillary: 135 mg/dL — ABNORMAL HIGH (ref 70–99)
Glucose-Capillary: 127 mg/dL — ABNORMAL HIGH (ref 70–99)

## 2020-05-07 LAB — SURGICAL PCR SCREEN
MRSA, PCR: NEGATIVE
Staphylococcus aureus: NEGATIVE

## 2020-05-07 LAB — VANCOMYCIN, TROUGH: Vancomycin Tr: 15 ug/mL (ref 15–20)

## 2020-05-07 SURGERY — CRANIOPLASTY
Anesthesia: General | Site: Head

## 2020-05-07 MED ORDER — LACTATED RINGERS IV SOLN: INTRAVENOUS | Status: DC | PRN

## 2020-05-07 MED ORDER — ACETAMINOPHEN 10 MG/ML IV SOLN
1000.0000 mg | Freq: Once | INTRAVENOUS | Status: DC | PRN
  Administered 2020-05-07: 1000 mg via INTRAVENOUS

## 2020-05-07 MED ORDER — SODIUM CHLORIDE 0.9 % IV SOLN: INTRAVENOUS | Status: DC | PRN

## 2020-05-07 MED ORDER — THROMBIN 20000 UNITS EX SOLR
CUTANEOUS | Status: AC
  Filled 2020-05-07: qty 20000

## 2020-05-07 MED ORDER — ESMOLOL HCL 100 MG/10ML IV SOLN
INTRAVENOUS | Status: AC
  Filled 2020-05-07: qty 10

## 2020-05-07 MED ORDER — PROMETHAZINE HCL 25 MG/ML IJ SOLN: 6.2500 mg | INTRAMUSCULAR | Status: DC | PRN

## 2020-05-07 MED ORDER — SODIUM CHLORIDE 0.9 % IV SOLN: INTRAVENOUS | Status: DC

## 2020-05-07 MED ORDER — BACITRACIN ZINC 500 UNIT/GM EX OINT
TOPICAL_OINTMENT | CUTANEOUS | Status: AC
  Filled 2020-05-07: qty 28.35

## 2020-05-07 MED ORDER — PHENYLEPHRINE 40 MCG/ML (10ML) SYRINGE FOR IV PUSH (FOR BLOOD PRESSURE SUPPORT)
PREFILLED_SYRINGE | INTRAVENOUS | Status: AC
  Filled 2020-05-07: qty 20

## 2020-05-07 MED ORDER — OXYCODONE HCL 5 MG PO TABS
ORAL_TABLET | ORAL | Status: AC
  Filled 2020-05-07: qty 1

## 2020-05-07 MED ORDER — ROCURONIUM BROMIDE 10 MG/ML (PF) SYRINGE
PREFILLED_SYRINGE | INTRAVENOUS | Status: AC
  Filled 2020-05-07: qty 50

## 2020-05-07 MED ORDER — OXYCODONE HCL 5 MG PO TABS
5.0000 mg | ORAL_TABLET | Freq: Once | ORAL | Status: AC | PRN
  Administered 2020-05-07: 5 mg via ORAL

## 2020-05-07 MED ORDER — FENTANYL CITRATE (PF) 250 MCG/5ML IJ SOLN
INTRAMUSCULAR | Status: AC
  Filled 2020-05-07: qty 5

## 2020-05-07 MED ORDER — ONDANSETRON HCL 4 MG/2ML IJ SOLN
INTRAMUSCULAR | Status: DC | PRN
  Administered 2020-05-07: 4 mg via INTRAVENOUS

## 2020-05-07 MED ORDER — OXYCODONE HCL 5 MG/5ML PO SOLN: 5.0000 mg | Freq: Once | ORAL | Status: AC | PRN

## 2020-05-07 MED ORDER — PROPOFOL 10 MG/ML IV BOLUS
INTRAVENOUS | Status: AC
  Filled 2020-05-07: qty 20

## 2020-05-07 MED ORDER — LIDOCAINE-EPINEPHRINE 0.5 %-1:200000 IJ SOLN
INTRAMUSCULAR | Status: AC
  Filled 2020-05-07: qty 1

## 2020-05-07 MED ORDER — ROCURONIUM BROMIDE 100 MG/10ML IV SOLN
INTRAVENOUS | Status: DC | PRN
  Administered 2020-05-07: 60 mg via INTRAVENOUS

## 2020-05-07 MED ORDER — BACITRACIN ZINC 500 UNIT/GM EX OINT
TOPICAL_OINTMENT | CUTANEOUS | Status: DC | PRN
  Administered 2020-05-07: 1 via TOPICAL

## 2020-05-07 MED ORDER — ACETAMINOPHEN 10 MG/ML IV SOLN
INTRAVENOUS | Status: AC
  Filled 2020-05-07: qty 100

## 2020-05-07 MED ORDER — PROPOFOL 10 MG/ML IV BOLUS
INTRAVENOUS | Status: DC | PRN
  Administered 2020-05-07: 150 mg via INTRAVENOUS

## 2020-05-07 MED ORDER — THROMBIN 20000 UNITS EX SOLR: CUTANEOUS | Status: DC | PRN

## 2020-05-07 MED ORDER — MIDAZOLAM HCL 5 MG/5ML IJ SOLN
INTRAMUSCULAR | Status: DC | PRN
  Administered 2020-05-07: 2 mg via INTRAVENOUS

## 2020-05-07 MED ORDER — OXYCODONE HCL 5 MG PO TABS: 5.0000 mg | ORAL_TABLET | Freq: Four times a day (QID) | ORAL | Status: DC | PRN

## 2020-05-07 MED ORDER — LIDOCAINE 2% (20 MG/ML) 5 ML SYRINGE
INTRAMUSCULAR | Status: AC
  Filled 2020-05-07: qty 5

## 2020-05-07 MED ORDER — DEXAMETHASONE SODIUM PHOSPHATE 10 MG/ML IJ SOLN
INTRAMUSCULAR | Status: AC
  Filled 2020-05-07: qty 1

## 2020-05-07 MED ORDER — SUGAMMADEX SODIUM 200 MG/2ML IV SOLN
INTRAVENOUS | Status: DC | PRN
  Administered 2020-05-07: 200 mg via INTRAVENOUS

## 2020-05-07 MED ORDER — LIDOCAINE HCL (CARDIAC) PF 100 MG/5ML IV SOSY
PREFILLED_SYRINGE | INTRAVENOUS | Status: DC | PRN
  Administered 2020-05-07: 40 mg via INTRAVENOUS

## 2020-05-07 MED ORDER — FENTANYL CITRATE (PF) 100 MCG/2ML IJ SOLN
INTRAMUSCULAR | Status: DC | PRN
  Administered 2020-05-07: 50 ug via INTRAVENOUS
  Administered 2020-05-07: 25 ug via INTRAVENOUS
  Administered 2020-05-07: 50 ug via INTRAVENOUS

## 2020-05-07 MED ORDER — 0.9 % SODIUM CHLORIDE (POUR BTL) OPTIME
TOPICAL | Status: DC | PRN
  Administered 2020-05-07 (×2): 1000 mL

## 2020-05-07 MED ORDER — MIDAZOLAM HCL 2 MG/2ML IJ SOLN
INTRAMUSCULAR | Status: AC
  Filled 2020-05-07: qty 2

## 2020-05-07 MED ORDER — PHENYLEPHRINE HCL (PRESSORS) 10 MG/ML IV SOLN
INTRAVENOUS | Status: DC | PRN
  Administered 2020-05-07: 80 ug via INTRAVENOUS

## 2020-05-07 MED ORDER — EPHEDRINE 5 MG/ML INJ
INTRAVENOUS | Status: AC
  Filled 2020-05-07: qty 20

## 2020-05-07 MED ORDER — ONDANSETRON HCL 4 MG/2ML IJ SOLN
INTRAMUSCULAR | Status: AC
  Filled 2020-05-07: qty 2

## 2020-05-07 MED ORDER — DEXAMETHASONE SODIUM PHOSPHATE 10 MG/ML IJ SOLN
INTRAMUSCULAR | Status: DC | PRN
  Administered 2020-05-07: 5 mg via INTRAVENOUS

## 2020-05-07 MED ORDER — HYDROMORPHONE HCL 1 MG/ML IJ SOLN: 0.2500 mg | INTRAMUSCULAR | Status: DC | PRN

## 2020-05-07 SURGICAL SUPPLY — 34 items
CANISTER SUCT 3000ML PPV (MISCELLANEOUS) ×3 IMPLANT
DRAPE NEUROLOGICAL W/INCISE (DRAPES) ×3 IMPLANT
DRAPE WARM FLUID 44X44 (DRAPES) ×3 IMPLANT
DRSG TELFA 3X8 NADH (GAUZE/BANDAGES/DRESSINGS) ×3 IMPLANT
DURAPREP 6ML APPLICATOR 50/CS (WOUND CARE) ×3 IMPLANT
ELECT REM PT RETURN 9FT ADLT (ELECTROSURGICAL) ×3
ELECTRODE REM PT RTRN 9FT ADLT (ELECTROSURGICAL) ×1 IMPLANT
GLOVE ECLIPSE 6.5 STRL STRAW (GLOVE) ×6 IMPLANT
GLOVE ECLIPSE 7.0 STRL STRAW (GLOVE) ×6 IMPLANT
GLOVE EXAM NITRILE XL STR (GLOVE) IMPLANT
GOWN STRL REUS W/ TWL LRG LVL3 (GOWN DISPOSABLE) ×2 IMPLANT
GOWN STRL REUS W/ TWL XL LVL3 (GOWN DISPOSABLE) ×2 IMPLANT
GOWN STRL REUS W/TWL 2XL LVL3 (GOWN DISPOSABLE) IMPLANT
GOWN STRL REUS W/TWL LRG LVL3 (GOWN DISPOSABLE) ×4
GOWN STRL REUS W/TWL XL LVL3 (GOWN DISPOSABLE) ×4
HEMOSTAT SURGICEL 2X14 (HEMOSTASIS) ×3 IMPLANT
KIT BASIN OR (CUSTOM PROCEDURE TRAY) ×3 IMPLANT
KIT TURNOVER KIT B (KITS) ×3 IMPLANT
NEEDLE HYPO 25X1 1.5 SAFETY (NEEDLE) ×3 IMPLANT
NS IRRIG 1000ML POUR BTL (IV SOLUTION) ×3 IMPLANT
PACK CRANIOTOMY CUSTOM (CUSTOM PROCEDURE TRAY) ×3 IMPLANT
PAD ARMBOARD 7.5X6 YLW CONV (MISCELLANEOUS) ×9 IMPLANT
PIN MAYFIELD SKULL DISP (PIN) IMPLANT
SET CRAINOPLASTY (SET/KITS/TRAYS/PACK) ×3 IMPLANT
SPONGE SURGIFOAM ABS GEL 100 (HEMOSTASIS) ×3 IMPLANT
STAPLER SKIN PROX WIDE 3.9 (STAPLE) ×3 IMPLANT
SUT ETHILON 2 0 PSLX (SUTURE) ×6 IMPLANT
SUT ETHILON 3 0 FSL (SUTURE) IMPLANT
SUT NURALON 4 0 TR CR/8 (SUTURE) ×3 IMPLANT
SUT VIC AB 2-0 CT2 18 VCP726D (SUTURE) IMPLANT
TOWEL GREEN STERILE (TOWEL DISPOSABLE) ×3 IMPLANT
TOWEL GREEN STERILE FF (TOWEL DISPOSABLE) ×3 IMPLANT
UNDERPAD 30X36 HEAVY ABSORB (UNDERPADS AND DIAPERS) IMPLANT
WATER STERILE IRR 1000ML POUR (IV SOLUTION) ×3 IMPLANT

## 2020-05-07 NOTE — Progress Notes (Signed)
Pharmacy Antibiotic Note  Harold Torres is a 44 y.o. male admitted on 05/04/2020 with craniotomy incision infection, on abx x 2wks PTA.  Pharmacy has been consulted for vancomycin dosing.  Ceftriaxone per MD  Vancomycin trough 15 (at goal)  Plan: Continue Vancomycin 1 gram iv Q  8 hours Continue Ceftriaxone 2 grams iv Q 12 hours Continue to follow  Weight: 89.4 kg (197 lb 1.5 oz)  Temp (24hrs), Avg:98.1 F (36.7 C), Min:97.9 F (36.6 C), Max:98.2 F (36.8 C)  Recent Labs  Lab 05/04/20 0454 05/04/20 1220 05/06/20 0442 05/07/20 1111  WBC 10.2 9.0  --   --   CREATININE 0.73  --  0.79  --   VANCOTROUGH  --   --   --  15    Estimated Creatinine Clearance: 123.7 mL/min (by C-G formula based on SCr of 0.79 mg/dL).    No Known Allergies  Thank you Anette Guarneri, PharmD 05/07/2020 12:37 PM

## 2020-05-07 NOTE — Op Note (Signed)
05/07/2020  6:14 PM  PATIENT:  Harold Torres  44 y.o. male  PRE-OPERATIVE DIAGNOSIS:  WOUND INFECTION  POST-OPERATIVE DIAGNOSIS:  WOUND INFECTION  PROCEDURE:  Procedure(s): REMOVAL OF CRANIAL PLATE  SURGEON: Surgeon(s): Ashok Pall, MD Consuella Lose, MD  ASSISTANTS:nundkumar, neelesh  ANESTHESIA:   general  EBL:  Total I/O In: 0  Out: 100 [Blood:100]  BLOOD ADMINISTERED:none  CELL SAVER GIVEN:none  COUNT:per nursing  DRAINS: none   SPECIMEN:  Source of Specimen:  wound  DICTATION: Harold Torres was taken to the operating room, intubated, and placed under a general anesthetic without difficulty. He was positioned supine with his head on a horseshoe head rest. His heaed was prepped and draped in a sterile manner. I opened the incision which had closed. I encountered very little purulence at that point. I with Dr. Kathyrn Sheriff removed the plates then the peek cranioplasty implant. More purulence was present underneath the implant, and I cultured that separately. We irrigated the wound. I then closed the wound with nylon sutures. A sterile dressing was applied. He was extubated in the room with ease.   PLAN OF CARE: Admit to inpatient   PATIENT DISPOSITION:  PACU - hemodynamically stable.   Delay start of Pharmacological VTE agent (>24hrs) due to surgical blood loss or risk of bleeding:  no

## 2020-05-07 NOTE — Progress Notes (Signed)
Patient ID: Harold Torres, male   DOB: [birth year], 44 y.o.   MRN: [id number] BP 116/76   Pulse 67   Temp 98.1 F (36.7 C)   Resp 18   Wt 89.4 kg   SpO2 97%   BMI 32.80 kg/m  Alert and oriented x 4, speech is clear and fluent Moving all extremities well Wound with purulent drainage.  OR today to remove peek implant.

## 2020-05-07 NOTE — Progress Notes (Signed)
Patient arrived in the unit at 2000 pm from PACU, A&Ox4, denies of pain, no s/s distress, incision site is intact, no active bleeding, pt is resting in a bed at this moment, VTs are WNL, and will continue to monitor closely.

## 2020-05-07 NOTE — Anesthesia Preprocedure Evaluation (Addendum)
Anesthesia Evaluation  Patient identified by MRN, date of birth, ID band Patient awake    Reviewed: Allergy & Precautions, NPO status , Patient's Chart, lab work & pertinent test results  Airway Mallampati: IV  TM Distance: >3 FB Neck ROM: Full    Dental no notable dental hx.    Pulmonary former smoker,    Pulmonary exam normal breath sounds clear to auscultation       Cardiovascular negative cardio ROS Normal cardiovascular exam Rhythm:Regular Rate:Normal  ECG: NSR, rate 65   Neuro/Psych Meningioma  negative psych ROS   GI/Hepatic negative GI ROS, (+) Hepatitis -  Endo/Other  diabetes, Oral Hypoglycemic Agents  Renal/GU negative Renal ROS     Musculoskeletal negative musculoskeletal ROS (+)   Abdominal (+) + obese,   Peds  Hematology  (+) anemia ,   Anesthesia Other Findings WOUND INFECTION  Reproductive/Obstetrics                           Anesthesia Physical Anesthesia Plan  ASA: II  Anesthesia Plan: General   Post-op Pain Management:    Induction: Intravenous  PONV Risk Score and Plan: 2 and Ondansetron, Treatment may vary due to age or medical condition and Propofol infusion  Airway Management Planned: Oral ETT and Video Laryngoscope Planned  Additional Equipment:   Intra-op Plan:   Post-operative Plan: Extubation in OR  Informed Consent: I have reviewed the patients History and Physical, chart, labs and discussed the procedure including the risks, benefits and alternatives for the proposed anesthesia with the patient or authorized representative who has indicated his/her understanding and acceptance.     Dental advisory given and Interpreter used for interveiw  Plan Discussed with: CRNA  Anesthesia Plan Comments:       Anesthesia Quick Evaluation

## 2020-05-07 NOTE — Anesthesia Procedure Notes (Signed)
Procedure Name: Intubation Date/Time: 05/07/2020 4:48 PM Performed by: Denora Wysocki T, CRNA Pre-anesthesia Checklist: Patient identified, Emergency Drugs available, Suction available and Patient being monitored Patient Re-evaluated:Patient Re-evaluated prior to induction Oxygen Delivery Method: Circle system utilized Preoxygenation: Pre-oxygenation with 100% oxygen Induction Type: IV induction Ventilation: Mask ventilation without difficulty Laryngoscope Size: Mac and 4 Grade View: Grade I Tube type: Oral Tube size: 7.5 mm Number of attempts: 1 Airway Equipment and Method: Stylet and Oral airway Placement Confirmation: ETT inserted through vocal cords under direct vision,  positive ETCO2 and breath sounds checked- equal and bilateral Secured at: 22 cm Tube secured with: Tape Dental Injury: Teeth and Oropharynx as per pre-operative assessment

## 2020-05-07 NOTE — Transfer of Care (Signed)
Immediate Anesthesia Transfer of Care Note  Patient: Harold Torres  Procedure(s) Performed: REMOVAL OF CRANIAL PLATE (N/A Head)  Patient Location: PACU  Anesthesia Type:General  Level of Consciousness: drowsy  Airway & Oxygen Therapy: Patient Spontanous Breathing and Patient connected to nasal cannula oxygen  Post-op Assessment: Report given to RN, Post -op Vital signs reviewed and stable and Patient moving all extremities  Post vital signs: Reviewed and stable  Last Vitals:  Vitals Value Taken Time  BP 112/73 05/07/20 1817  Temp 36.7 C 05/07/20 1817  Pulse 73 05/07/20 1818  Resp 15 05/07/20 1818  SpO2 97 % 05/07/20 1818  Vitals shown include unvalidated device data.  Last Pain:  Vitals:   05/07/20 0800  TempSrc:   PainSc: 0-No pain         Complications: No complications documented.

## 2020-05-07 NOTE — Anesthesia Postprocedure Evaluation (Signed)
Anesthesia Post Note  Patient: Harold Torres  Procedure(s) Performed: REMOVAL OF CRANIAL PLATE (N/A Head)     Patient location during evaluation: PACU Anesthesia Type: General Level of consciousness: awake Pain management: pain level controlled Vital Signs Assessment: post-procedure vital signs reviewed and stable Respiratory status: spontaneous breathing, nonlabored ventilation, respiratory function stable and patient connected to nasal cannula oxygen Cardiovascular status: blood pressure returned to baseline and stable Postop Assessment: no apparent nausea or vomiting Anesthetic complications: no   No complications documented.  Last Vitals:  Vitals:   05/07/20 2004 05/07/20 2004  BP: 112/77 112/77  Pulse: 75 72  Resp: 18 18  Temp: 37 C 37 C  SpO2:  95%    Last Pain:  Vitals:   05/07/20 2004  TempSrc: Oral  PainSc:                  Karyl Kinnier Brithany Whitworth

## 2020-05-08 ENCOUNTER — Ambulatory Visit: Payer: 59

## 2020-05-08 ENCOUNTER — Encounter (HOSPITAL_COMMUNITY): Payer: Self-pay | Admitting: Neurosurgery

## 2020-05-08 LAB — GLUCOSE, CAPILLARY: Glucose-Capillary: 170 mg/dL — ABNORMAL HIGH (ref 70–99)

## 2020-05-08 MED ORDER — INFLUENZA VAC SPLIT QUAD 0.5 ML IM SUSY
0.5000 mL | PREFILLED_SYRINGE | INTRAMUSCULAR | Status: AC
  Administered 2020-05-09: 0.5 mL via INTRAMUSCULAR
  Filled 2020-05-08: qty 0.5

## 2020-05-08 NOTE — Progress Notes (Signed)
Spoke to pt with interpreter iPad.  He speaks Nurse, learning disability.  He actually pulled iPad over to him and chose the language for this nurse so that I could communicate with him.  He denied pain and said he is very happy with his care.  No distress or complaints.

## 2020-05-08 NOTE — Progress Notes (Signed)
Patient ID: Harold Torres, male   DOB: April 04, 1976, 44 y.o.   MRN: [id number] BP 105/66 (BP Location: Right Arm)   Pulse 72   Temp 98.4 F (36.9 C) (Oral)   Resp 18   Wt 89.4 kg   SpO2 96%   BMI 32.80 kg/m  Cultures remain negative.  Comfortable on antibiotics. Will consult ID tomorrow.  Will need more time with abx prior to replacing a cranioplasty flap.  No neurological deficits.

## 2020-05-09 ENCOUNTER — Ambulatory Visit: Payer: 59

## 2020-05-09 NOTE — Progress Notes (Signed)
Patient ID: Harold Torres, male   DOB: 1975/11/25, 44 y.o.   MRN: [id number] BP 106/76 (BP Location: Right Arm)   Pulse 85   Temp 98.3 F (36.8 C)   Resp 18   Wt 89.4 kg   SpO2 96%   BMI 32.80 kg/m  Cultures seem to show Klebsiella once again. But the cultures are not conclusive. Will continue current abx regimen. Will consult ID tomorrow.  Normal exam.

## 2020-05-10 LAB — BASIC METABOLIC PANEL
Anion gap: 8 (ref 5–15)
BUN: 14 mg/dL (ref 6–20)
CO2: 25 mmol/L (ref 22–32)
Calcium: 8.4 mg/dL — ABNORMAL LOW (ref 8.9–10.3)
Chloride: 105 mmol/L (ref 98–111)
Creatinine, Ser: 0.84 mg/dL (ref 0.61–1.24)
GFR, Estimated: >60 mL/min (ref >60)
Glucose, Bld: 225 mg/dL — ABNORMAL HIGH (ref 70–99)
Potassium: 3.7 mmol/L (ref 3.5–5.1)
Sodium: 138 mmol/L (ref 135–145)

## 2020-05-10 NOTE — Progress Notes (Signed)
Pharmacy Antibiotic Note  Harold Torres is a 44 y.o. 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  Pharmacy has been consulted for Vancomycin dosing. Also on Ceftriaxone per MD. Day # 7 antibiotics.   Last Vancomycin trough level = 15 mcg/ml on 11/3, at goal of 15-20 mcg/ml.  Renal function stable.       S/p removal of cranial plate on 10/9. Wound noted clean and dry.    Cultures grew MRSE and Klebsiella.   MRSE vanc MIC < 0.5.  Klebsiella sensitive to Ceftriaxone.   Noted plan to consult ID.  Plan:  Continue Vancomycin 1gm IV q8hrs.  Continue Ceftriaxone 2gm IV q12hrs.  Bmet at least q72h while on Vancomycin.  Vanc trough level at least weekly while on vanc > next due 11/10.    Follow ID input, clinical progress and antibiotic plans.  Height: 65 inches Weight: 89.4 kg (197 lb 1.5 oz)  IBW; 57 kg   Temp (24hrs), Avg:98.2 F (36.8 C), Min:98 F (36.7 C), Max:98.3 F (36.8 C)  Recent Labs  Lab 05/04/20 0454 05/04/20 1220 05/06/20 0442 05/07/20 1111 05/10/20 0252  WBC 10.2 9.0  --   --   --   CREATININE 0.73  --  0.79  --  0.84  VANCOTROUGH  --   --   --  15  --     Estimated Creatinine Clearance: 117.8 mL/min (by C-G formula based on SCr of 0.84 mg/dL).    No Known Allergies  Antimicrobials this admission: Vancomycin 10/31> Ceftriaxone 10/31>  Dose adjustments this admission:  11/1: empiric Vanc regimen 1 gm q12h > q8h for CNS penetration  11/3: VT 15 mcg/ml - continue Vanc 1gm IV q8h  11/1: Cetriaxone 1 > 2gm IV q12h for CNS penetration  Microbiology results: 11/3 superficial surgical wound: rare Klebsiella, sens to CTX and all others, except R to Amp 11/3 cranial flap: rare Klebsiella (as above) and rare MRSE - MIC to vanc < 0.5, sens to TCN, Gent < 0.5.  R Oxa, Cipro, Emycin, Septra, Clinda 11/3 MRSA PCR: negative 10/31 COVID and flu: negative  Thank you for allowing pharmacy to be a part of this patient's care.  Arty Baumgartner,  [place] Phone: [phone] 05/10/2020 12:32 PM

## 2020-05-10 NOTE — Progress Notes (Signed)
Patient ID: Harold Torres, male   DOB: [birth year], 44 y.o.   MRN: [id number] Patient awake and alert.  Wound looks clean and dry.  Continue vancomycin and Rocephin as cultures are pending.  Looks like there is Klebsiella and staph epi but sensitivities are not done.

## 2020-05-11 NOTE — Progress Notes (Signed)
Neurosurgery Service Progress Note  Subjective: No acute events overnight, examined pt w/ help of medical interpreter, no complaints this morning except some mild headaches  Objective: Vitals:   05/10/20 1531 05/10/20 1937 05/11/20 0003 05/11/20 0331  BP: 114/73 113/87 109/66 96/67  Pulse: 82 77 75 66  Resp: 16 16 16 16   Temp: 98.5 F (36.9 C) 98.1 F (36.7 C) 98.6 F (37 C) 97.8 F (36.6 C)  TempSrc: Oral Oral Oral Oral  SpO2: 95% 99% 96% 95%  Weight:        Physical Exam: AOx3, PERRL, EOMI, FS, TM, Strength 5/5 x4, SILTx4 Incision c/d/i  Assessment & Plan: 44 y.o. 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  -Cx growing rare Klebs (amp-resistent) & staph epi (ox-resistant, vanc-sens) on CTX + vanc, last VancT 15 -ID c/s in AM for recommendations regarding single agent to cover both organisms, if possible, likely will need PICC line placement  Marcello Moores A Manasseh Pittsley  05/11/20 8:15 AM

## 2020-05-12 ENCOUNTER — Ambulatory Visit: Payer: 59

## 2020-05-12 LAB — AEROBIC/ANAEROBIC CULTURE W GRAM STAIN (SURGICAL/DEEP WOUND)

## 2020-05-12 NOTE — Plan of Care (Signed)
  Problem: Education: ?Goal: Knowledge of General Education information will improve ?Description: Including pain rating scale, medication(s)/side effects and non-pharmacologic comfort measures ?Outcome: Progressing ?  ?Problem: Health Behavior/Discharge Planning: ?Goal: Ability to manage health-related needs will improve ?Outcome: Progressing ?  ?Problem: Clinical Measurements: ?Goal: Ability to maintain clinical measurements within normal limits will improve ?Outcome: Progressing ?Goal: Will remain free from infection ?Outcome: Progressing ?Goal: Diagnostic test results will improve ?Outcome: Progressing ?Goal: Respiratory complications will improve ?Outcome: Progressing ?Goal: Cardiovascular complication will be avoided ?Outcome: Progressing ?  ?Problem: Activity: ?Goal: Risk for activity intolerance will decrease ?Outcome: Progressing ?  ?Problem: Coping: ?Goal: Level of anxiety will decrease ?Outcome: Progressing ?  ?Problem: Elimination: ?Goal: Will not experience complications related to bowel motility ?Outcome: Progressing ?Goal: Will not experience complications related to urinary retention ?Outcome: Progressing ?  ?Problem: Safety: ?Goal: Ability to remain free from injury will improve ?Outcome: Progressing ?  ?

## 2020-05-12 NOTE — Progress Notes (Signed)
Patient ID: Harold Torres, male   DOB: 19-Dec-1975, 44 y.o.   MRN: [id number] BP 112/72 (BP Location: Right Arm)   Pulse 81   Temp 98.3 F (36.8 C) (Oral)   Resp 18   Wt 89.4 kg   SpO2 97%   BMI 32.80 kg/m  Alert, afebrile. Wound is not draining. Looks good. Plan for ID and picc Normal neurological exam

## 2020-05-13 ENCOUNTER — Ambulatory Visit: Payer: 59

## 2020-05-13 DIAGNOSIS — T8149XA Infection following a procedure, other surgical site, initial encounter: Secondary | ICD-10-CM | POA: Diagnosis not present

## 2020-05-13 LAB — BASIC METABOLIC PANEL
Anion gap: 9 (ref 5–15)
BUN: 8 mg/dL (ref 6–20)
CO2: 27 mmol/L (ref 22–32)
Calcium: 8.9 mg/dL (ref 8.9–10.3)
Chloride: 105 mmol/L (ref 98–111)
Creatinine, Ser: 0.82 mg/dL (ref 0.61–1.24)
GFR, Estimated: >60 mL/min (ref >60)
Glucose, Bld: 138 mg/dL — ABNORMAL HIGH (ref 70–99)
Potassium: 3.9 mmol/L (ref 3.5–5.1)
Sodium: 141 mmol/L (ref 135–145)

## 2020-05-13 LAB — GLUCOSE, CAPILLARY: Glucose-Capillary: 221 mg/dL — ABNORMAL HIGH (ref 70–99)

## 2020-05-13 LAB — VANCOMYCIN, TROUGH: Vancomycin Tr: 15 ug/mL (ref 15–20)

## 2020-05-13 NOTE — Progress Notes (Signed)
Patient ID: Harold Torres, male   DOB: 01/23/1976, 44 y.o.   MRN: [id number] BP 118/70 (BP Location: Right Arm)   Pulse 79   Temp 98.2 F (36.8 C) (Oral)   Resp 16   Wt 89.4 kg   SpO2 96%   BMI 32.80 kg/m  Plan on prolonged iv abx. Will receive PICC line No neurological issues.

## 2020-05-13 NOTE — Progress Notes (Signed)
Pharmacy Antibiotic Note  Harold Torres is a 44 y.o. male admitted on 05/04/2020 with craniotomy incision infection.  Pharmacy has been consulted for Vancomycin dosing. Also on Ceftriaxone per MD. Day # 10 antibiotics.  Vancomycin trough is 15 mcg/ml, at goal of 15-20 mcg/ml.  Renal function stable.     S/p removal of cranial plate on 71/2. Wound noted clean and dry.  Cultures grew MRSE and Klebsiella. MRSE vanc MIC < 0.5.  Klebsiella sensitive to Ceftriaxone. Noted plan to consult ID.  Plan:  Continue Vancomycin 1gm IV q8hrs.  Continue Ceftriaxone 2gm IV q12hrs.  Bmet at least q72h while on Vancomycin.  Vanc trough level at least weekly while on vanc > next due 11/16.    Follow ID input, clinical progress and antibiotic plans.  Height: 65 inches Weight: 89.4 kg (197 lb 1.5 oz)  IBW; 57 kg   Temp (24hrs), Avg:98.3 F (36.8 C), Min:98 F (36.7 C), Max:98.6 F (37 C)  Recent Labs  Lab 05/07/20 1111 05/10/20 0252  CREATININE  --  0.84  VANCOTROUGH 15  --     Estimated Creatinine Clearance: 117.8 mL/min (by C-G formula based on SCr of 0.84 mg/dL).    No Known Allergies  Antimicrobials this admission: Vancomycin 10/31> Ceftriaxone 10/31>  Dose adjustments this admission:  11/1: empiric Vanc regimen 1 gm q12h > q8h for CNS penetration  11/3: VT 15 mcg/ml - continue Vanc 1gm IV q8h  11/1: Cetriaxone 1 > 2gm IV q12h for CNS penetration  11/9: VT 15 mcg/ml - cont 1 g IV q8h  Microbiology results: 11/3 superficial surgical wound: rare Klebsiella, sens to CTX and all others, except R to Amp 11/3 cranial flap: rare Klebsiella (as above) and rare MRSE - MIC to vanc < 0.5, sens to TCN, Gent < 0.5.  R Oxa, Cipro, Emycin, Septra, Clinda 11/3 MRSA PCR: negative 10/31 COVID and flu: negative  Thank you for involving pharmacy in this patient's care.  Renold Genta, PharmD, BCPS Clinical Pharmacist Clinical phone for 05/13/2020 until 3p is [phone] 05/13/2020 10:44  AM  **Pharmacist phone directory can be found on [email or web address] listed under [place]**

## 2020-05-13 NOTE — Plan of Care (Signed)
  Problem: Education: Goal: Knowledge of General Education information will improve Description: Including pain rating scale, medication(s)/side effects and non-pharmacologic comfort measures Outcome: Progressing   Problem: Coping: Goal: Level of anxiety will decrease Outcome: Progressing   Problem: Pain Managment: Goal: General experience of comfort will improve Outcome: Progressing   Problem: Safety: Goal: Ability to remain free from injury will improve Outcome: Progressing   Problem: Skin Integrity: Goal: Risk for impaired skin integrity will decrease Outcome: Progressing   Problem: Education: Goal: Knowledge of General Education information will improve Description: Including pain rating scale, medication(s)/side effects and non-pharmacologic comfort measures Outcome: Progressing

## 2020-05-13 NOTE — Consult Note (Signed)
[facility] [place] for Infectious [facility]                                                                                        Patient Identification: Patient Name: Harold Torres MRN: [id number] [place] Date: 05/04/2020  4:12 AM Today's Date: 05/13/2020 Reason for consult:   Active Problems:   Postoperative wound infection   Antibiotics: ceftriaxone day 10 and Vancomycin Day 10  Assessment Post operative cranial wound infection, R/o deeper involvement or bone involvement -  OR note reviewed and discussed with Dr Christella Noa with no concerns for deeper/bone involvement. All hardware out.   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 No flare currently. LFTs WNL.  Patient can follow up with his prior provider Dr Cindee Salt [facility] ) or [facility] if he desires.   Recommendations  Continue Vancomycin, pharmacy to dose  Cefepime can be de-escalated to ceftriaxone ( CNS dosing) He will need a prolonged course of treatment at least 4 weeks from date of last OR 11/3 given recurrence of infection with previous short course of 2 weeks of IV abx.  PICC line  Monitor CBC, CMP and Vanc trough  while on IV abx  ESR and CRP baseline and weely thereafter Follow up with [facility] will be made upon discharge  Rosiland Oz, MD [place] for Infectious Diseases   To contact the attending provider between 8A-5P or the covering provider during after hours 5P-8A, please log into the web site [email or web address] and access using universal [place] password for that web site. If you do not have the password, please call the hospital operator. __________________________________________________________________________________________________________ HPI and Hospital Course: 44 YO male with a history of occipital craniotomy for rhabdoid meningioma in in 01/2020 followed by craniectomy for the tumor in 8/21 then cranioplasty in 9/21 (  graft using plates and screws). Post operatively , he developed a wound infection. He underwent I and D with peek cranioplasty implant removed on 03/21/20. OR cx grew Kleb pnuemo. He was treated with 2 weeks of IV ceftriaxone and then underwent replacement of the flap with a wound closure in 04/04/20.  He underwent wound repair on 10/14 and was discharged on 05/04/20. Upon follow up with NSx O, the wound opened in the middle of the incision  approx 1.5cm  And also had purulent drainage for 3 -4 days prior to admit. Denies any fever, chills, headache,. Blurry vision, ear pain, neck pain.   Went to OR on 05/07/20 for removal of the cranial plate where they encountered very little purulence, the peek cranioplasty implant and plates were removed. OR cultures grew Kleb pneumo again with MRSE  ROS: General- Denies fever, chills, loss of appetite and loss of weight HEENT - Denies headache, blurry vision, neck pain, sinus pain Chest - Denies any chest pain, SOB or cough CVS- Denies any dizziness/lightheadedness, syncopal attacks, palpitations Abdomen- Denies any nausea, vomiting, abdominal pain, hematochezia and diarrhea Neuro - Denies any weakness, numbness, tingling sensation Psych - Denies any changes in mood irritability or depressive symptoms GU- Denies any burning, dysuria, hematuria or increased frequency of urination Skin - denies  any rashes/lesions MSK - denies any joint pain/swelling or restricted ROM   Past Medical History:  Diagnosis Date  . Diabetes mellitus without complication ([place])    type 2  . Generalized skin cysts    back x 2, left leg, hand  . Hepatitis    Hepatitis B   . Meningioma ([place])   . Seasonal allergies    Past Surgical History:  Procedure Laterality Date  . CRANIOPLASTY N/A 03/14/2020   Procedure: CRANIOPLASTY;  Surgeon: Ashok Pall, MD;  Location: [place];  Service: Neurosurgery;  Laterality: N/A;  . CRANIOPLASTY N/A 03/21/2020   Procedure: Cranial wound incision and  drainage;  Surgeon: Ashok Pall, MD;  Location: [place];  Service: Neurosurgery;  Laterality: N/A;  Cranial wound incision and drainage  . CRANIOPLASTY N/A 04/04/2020   Procedure: Replacement of prosthetic bone flap;  Surgeon: Ashok Pall, MD;  Location: [place];  Service: Neurosurgery;  Laterality: N/A;  . CRANIOPLASTY N/A 05/07/2020   Procedure: REMOVAL OF CRANIAL PLATE;  Surgeon: Ashok Pall, MD;  Location: [place];  Service: Neurosurgery;  Laterality: N/A;  . CRANIOTOMY Bilateral 01/31/2020   Procedure: OCCIPITAL CRANIOTOMY FOR MASS;  Surgeon: Ashok Pall, MD;  Location: [place];  Service: Neurosurgery;  Laterality: Bilateral;  . CRANIOTOMY N/A 02/08/2020   Procedure: CRANIECTOMY FOR TUMOR;  Surgeon: Ashok Pall, MD;  Location: [place];  Service: Neurosurgery;  Laterality: N/A;  CRANIECTOMY FOR TUMOR  . WOUND EXPLORATION N/A 04/17/2020   Procedure: Scalp Wound Repair;  Surgeon: Ashok Pall, MD;  Location: [place];  Service: Neurosurgery;  Laterality: N/A;  3C     Scheduled Meds: . loratadine  10 mg Oral Daily  . metFORMIN  1,500 mg Oral BID WC   Continuous Infusions: . sodium chloride 75 mL/hr at 05/13/20 1204  . cefTRIAXone (ROCEPHIN)  IV 2 g (05/13/20 0935)  . vancomycin 1,000 mg (05/13/20 0427)   PRN Meds:.acetaminophen **OR** acetaminophen, HYDROcodone-acetaminophen, ondansetron **OR** ondansetron (ZOFRAN) IV, oxyCODONE  No Known Allergies  Social History   Socioeconomic History  . Marital status: Married    Spouse name: Not on file  . Number of children: Not on file  . Years of education: Not on file  . Highest education level: Not on file  Occupational History  . Not on file  Tobacco Use  . Smoking status: Former Smoker    Types: Cigarettes  . Smokeless tobacco: Never Used  . Tobacco comment: at age [age]  Vaping Use  . Vaping Use: Never used  Substance and Sexual Activity  . Alcohol use: Never  . Drug use: Never  . Sexual activity: Yes    Birth  control/protection: Condom  Other Topics Concern  . Not on file  Social History Narrative  . Not on file   Social Determinants of Health   Financial Resource Strain:   . Difficulty of Paying Living Expenses: Not on file  Food Insecurity:   . Worried About Charity fundraiser in the Last Year: Not on file  . Ran Out of Food in the Last Year: Not on file  Transportation Needs:   . Lack of Transportation (Medical): Not on file  . Lack of Transportation (Non-Medical): Not on file  Physical Activity:   . Days of Exercise per Week: Not on file  . Minutes of Exercise per Session: Not on file  Stress:   . Feeling of Stress : Not on file  Social Connections:   . Frequency of Communication with Friends and Family: Not  on file  . Frequency of Social Gatherings with Friends and Family: Not on file  . Attends Religious Services: Not on file  . Active Member of Clubs or Organizations: Not on file  . Attends Archivist Meetings: Not on file  . Marital Status: Not on file  Intimate Partner Violence:   . Fear of Current or Ex-Partner: Not on file  . Emotionally Abused: Not on file  . Physically Abused: Not on file  . Sexually Abused: Not on file   Social History   Socioeconomic History  . Marital status: Married    Spouse name: Not on file  . Number of children: Not on file  . Years of education: Not on file  . Highest education level: Not on file  Occupational History  . Not on file  Tobacco Use  . Smoking status: Former Smoker    Types: Cigarettes  . Smokeless tobacco: Never Used  . Tobacco comment: at age [age]  Vaping Use  . Vaping Use: Never used  Substance and Sexual Activity  . Alcohol use: Never  . Drug use: Never  . Sexual activity: Yes    Birth control/protection: Condom  Other Topics Concern  . Not on file  Social History Narrative  . Not on file   Social Determinants of Health   Financial Resource Strain:   . Difficulty of Paying Living Expenses:  Not on file  Food Insecurity:   . Worried About Charity fundraiser in the Last Year: Not on file  . Ran Out of Food in the Last Year: Not on file  Transportation Needs:   . Lack of Transportation (Medical): Not on file  . Lack of Transportation (Non-Medical): Not on file  Physical Activity:   . Days of Exercise per Week: Not on file  . Minutes of Exercise per Session: Not on file  Stress:   . Feeling of Stress : Not on file  Social Connections:   . Frequency of Communication with Friends and Family: Not on file  . Frequency of Social Gatherings with Friends and Family: Not on file  . Attends Religious Services: Not on file  . Active Member of Clubs or Organizations: Not on file  . Attends Archivist Meetings: Not on file  . Marital Status: Not on file  Intimate Partner Violence:   . Fear of Current or Ex-Partner: Not on file  . Emotionally Abused: Not on file  . Physically Abused: Not on file  . Sexually Abused: Not on file    Vitals  BP 101/70 (BP Location: Right Arm)   Pulse 70   Temp 98 F (36.7 C) (Oral)   Resp 16   Wt 89.4 kg   SpO2 97%   BMI 32.80 kg/m   Physical Exam Constitutional:  NAD    Comments:   Cardiovascular:     Rate and Rhythm: Normal rate and regular rhythm.     Heart sounds: No murmur heard.   Pulmonary:     Effort: Pulmonary effort is normal.     Comments: Clear air entry bilaterally  Abdominal:     Palpations: Abdomen is soft.     Tenderness: non tender   Musculoskeletal:        General: No swelling or tenderness.   Skin:    Comments: scalp incision site is healing well - no drainage/redness or tenderness  Neurological:     General: No focal deficit present.   Psychiatric:  Mood and Affect: Mood normal.    LINES/TUBES: PIV  METAL IMPLANT/HARDWARE:  Pertinent Microbiology Results for orders placed or performed during the hospital encounter of 05/04/20  Resp Panel by RT PCR (RSV, Flu A&B, Covid) -  Nasopharyngeal Swab     Status: None   Collection Time: 05/04/20 11:34 AM   Specimen: Nasopharyngeal Swab  Result Value Ref Range Status   SARS Coronavirus 2 by RT PCR NEGATIVE NEGATIVE Final    Comment: (NOTE) SARS-CoV-2 target nucleic acids are NOT DETECTED.  The SARS-CoV-2 RNA is generally detectable in upper respiratoy specimens during the acute phase of infection. The lowest concentration of SARS-CoV-2 viral copies this assay can detect is 131 copies/mL. A negative result does not preclude SARS-Cov-2 infection and should not be used as the sole basis for treatment or other patient management decisions. A negative result may occur with  improper specimen collection/handling, submission of specimen other than nasopharyngeal swab, presence of viral mutation(s) within the areas targeted by this assay, and inadequate number of viral copies (<131 copies/mL). A negative result must be combined with clinical observations, patient history, and epidemiological information. The expected result is Negative.  Fact Sheet for Patients:  PinkCheek.be  Fact Sheet for Healthcare Providers:  GravelBags.it  This test is no t yet approved or cleared by the [place] FDA and  has been authorized for detection and/or diagnosis of SARS-CoV-2 by FDA under an Emergency Use Authorization (EUA). This EUA will remain  in effect (meaning this test can be used) for the duration of the COVID-19 declaration under Section 564(b)(1) of the Act, 21 U.S.C. section 360bbb-3(b)(1), unless the authorization is terminated or revoked sooner.     Influenza A by PCR NEGATIVE NEGATIVE Final   Influenza B by PCR NEGATIVE NEGATIVE Final    Comment: (NOTE) The Xpert Xpress SARS-CoV-2/FLU/RSV assay is intended as an aid in  the diagnosis of influenza from Nasopharyngeal swab specimens and  should not be used as a sole basis for treatment. Nasal washings and   aspirates are unacceptable for Xpert Xpress SARS-CoV-2/FLU/RSV  testing.  Fact Sheet for Patients: PinkCheek.be  Fact Sheet for Healthcare Providers: GravelBags.it  This test is not yet approved or cleared by the [place] FDA and  has been authorized for detection and/or diagnosis of SARS-CoV-2 by  FDA under an Emergency Use Authorization (EUA). This EUA will remain  in effect (meaning this test can be used) for the duration of the  Covid-19 declaration under Section 564(b)(1) of the Act, 21  U.S.C. section 360bbb-3(b)(1), unless the authorization is  terminated or revoked.    Respiratory Syncytial Virus by PCR NEGATIVE NEGATIVE Final    Comment: (NOTE) Fact Sheet for Patients: PinkCheek.be  Fact Sheet for Healthcare Providers: GravelBags.it  This test is not yet approved or cleared by the [place] FDA and  has been authorized for detection and/or diagnosis of SARS-CoV-2 by  FDA under an Emergency Use Authorization (EUA). This EUA will remain  in effect (meaning this test can be used) for the duration of the  COVID-19 declaration under Section 564(b)(1) of the Act, 21 U.S.C.  section 360bbb-3(b)(1), unless the authorization is terminated or  revoked. Performed at [facility], [place] [address] [place], [place] [address]   Surgical pcr screen     Status: None   Collection Time: 05/07/20  3:57 AM   Specimen: Nasal Mucosa; Nasal Swab  Result Value Ref Range Status   MRSA, PCR NEGATIVE NEGATIVE Final  Staphylococcus aureus NEGATIVE NEGATIVE Final    Comment: (NOTE) The Xpert SA Assay (FDA approved for NASAL specimens in patients 13 years of age and older), is one component of a comprehensive surveillance program. It is not intended to diagnose infection nor to guide or monitor treatment. Performed at [facility], [place] [address],  [place], [place] [address]   Aerobic/Anaerobic Culture (surgical/deep wound)     Status: None   Collection Time: 05/07/20  5:11 PM   Specimen: Wound  Result Value Ref Range Status   Specimen Description WOUND  Final   Special Requests CRANIAL SURGICAL WOUND  Final   Gram Stain   Final    RARE WBC PRESENT, PREDOMINANTLY MONONUCLEAR NO ORGANISMS SEEN    Culture   Final    RARE MULTIPLE ORGANISMS PRESENT, NONE PREDOMINANT NO GROUP A STREP (S.PYOGENES) ISOLATED NO STAPHYLOCOCCUS AUREUS ISOLATED NO ANAEROBES ISOLATED Performed at [facility], [place] [address] [place], [place] [address]    Report Status 05/12/2020 FINAL  Final  Aerobic/Anaerobic Culture (surgical/deep wound)     Status: None   Collection Time: 05/07/20  5:35 PM   Specimen: Wound  Result Value Ref Range Status   Specimen Description WOUND  Final   Special Requests CRANIAL SURGICAL WOUND SUPERFICIAL  Final   Gram Stain   Final    MODERATE WBC PRESENT, PREDOMINANTLY PMN NO ORGANISMS SEEN    Culture   Final    RARE KLEBSIELLA PNEUMONIAE NO ANAEROBES ISOLATED Performed at [facility], [place] [address], [place], [place] [address]    Report Status 05/12/2020 FINAL  Final   Organism ID, Bacteria KLEBSIELLA PNEUMONIAE  Final      Susceptibility   Klebsiella pneumoniae - MIC*    AMPICILLIN >=32 RESISTANT Resistant     CEFAZOLIN <=4 SENSITIVE Sensitive     CEFEPIME <=0.12 SENSITIVE Sensitive     CEFTAZIDIME <=1 SENSITIVE Sensitive     CEFTRIAXONE <=0.25 SENSITIVE Sensitive     CIPROFLOXACIN <=0.25 SENSITIVE Sensitive     GENTAMICIN <=1 SENSITIVE Sensitive     IMIPENEM <=0.25 SENSITIVE Sensitive     TRIMETH/SULFA <=20 SENSITIVE Sensitive     AMPICILLIN/SULBACTAM 4 SENSITIVE Sensitive     PIP/TAZO <=4 SENSITIVE Sensitive     * RARE KLEBSIELLA PNEUMONIAE  Aerobic/Anaerobic Culture (surgical/deep wound)     Status: None   Collection Time: 05/07/20  5:52 PM   Specimen: Wound  Result Value Ref Range Status    Specimen Description WOUND  Final   Special Requests CRANIAL FLAP  Final   Gram Stain   Final    FEW WBC PRESENT, PREDOMINANTLY PMN NO ORGANISMS SEEN    Culture   Final    RARE KLEBSIELLA PNEUMONIAE RARE STAPHYLOCOCCUS EPIDERMIDIS NO ANAEROBES ISOLATED Performed at [facility], [address] [place], [place] [address]    Report Status 05/12/2020 FINAL  Final   Organism ID, Bacteria KLEBSIELLA PNEUMONIAE  Final   Organism ID, Bacteria STAPHYLOCOCCUS EPIDERMIDIS  Final      Susceptibility   Klebsiella pneumoniae - MIC*    AMPICILLIN RESISTANT Resistant     CEFAZOLIN <=4 SENSITIVE Sensitive     CEFEPIME <=0.12 SENSITIVE Sensitive     CEFTAZIDIME <=1 SENSITIVE Sensitive     CEFTRIAXONE <=0.25 SENSITIVE Sensitive     CIPROFLOXACIN <=0.25 SENSITIVE Sensitive     GENTAMICIN <=1 SENSITIVE Sensitive     IMIPENEM <=0.25 SENSITIVE Sensitive     TRIMETH/SULFA <=20 SENSITIVE Sensitive  AMPICILLIN/SULBACTAM 4 SENSITIVE Sensitive     PIP/TAZO <=4 SENSITIVE Sensitive     * RARE KLEBSIELLA PNEUMONIAE   Staphylococcus epidermidis - MIC*    CIPROFLOXACIN >=8 RESISTANT Resistant     ERYTHROMYCIN >=8 RESISTANT Resistant     GENTAMICIN <=0.5 SENSITIVE Sensitive     OXACILLIN >=4 RESISTANT Resistant     TETRACYCLINE 2 SENSITIVE Sensitive     VANCOMYCIN <=0.5 SENSITIVE Sensitive     TRIMETH/SULFA 80 RESISTANT Resistant     CLINDAMYCIN >=8 RESISTANT Resistant     RIFAMPIN <=0.5 SENSITIVE Sensitive     Inducible Clindamycin NEGATIVE Sensitive     * RARE STAPHYLOCOCCUS EPIDERMIDIS    Pertinent Lab seen by me: CBC Latest Ref Rng & Units 05/04/2020 05/04/2020 04/17/2020  WBC 4.0 - 10.5 K/uL 9.0 10.2 4.7  Hemoglobin 13.0 - 17.0 g/dL 10.6(L) 11.5(L) 10.4(L)  Hematocrit 39 - 52 % 34.9(L) 37.4(L) 34.4(L)  Platelets 150 - 400 K/uL 179 253 227   CMP Latest Ref Rng & Units 05/13/2020 05/10/2020 05/06/2020  Glucose 70 - 99 mg/dL 138(H) 225(H) 239(H)  BUN 6 - 20 mg/dL 8 14 19   Creatinine 0.61  - 1.24 mg/dL 0.82 0.84 0.79  Sodium 135 - 145 mmol/L 141 138 138  Potassium 3.5 - 5.1 mmol/L 3.9 3.7 3.4(L)  Chloride 98 - 111 mmol/L 105 105 103  CO2 22 - 32 mmol/L 27 25 25   Calcium 8.9 - 10.3 mg/dL 8.9 8.4(L) 8.6(L)  Total Protein 6.5 - 8.1 g/dL - - -  Total Bilirubin 0.3 - 1.2 mg/dL - - -  Alkaline Phos 38 - 126 U/L - - -  AST 15 - 41 U/L - - -  ALT 0 - 44 U/L - - -    Pertinent Imagings/Other Imagings Plain films and CT images have been personally visualized and interpreted; radiology reports have been reviewed. Decision making incorporated into the Impression / Recommendations.   I have spent greater than 45  minutes for this patient encounter including review of prior medical records with greater than 50% of time being face to face and coordination of their care.

## 2020-05-14 ENCOUNTER — Ambulatory Visit: Payer: 59

## 2020-05-14 ENCOUNTER — Inpatient Hospital Stay: Payer: Self-pay

## 2020-05-14 LAB — GLUCOSE, CAPILLARY: Glucose-Capillary: 143 mg/dL — ABNORMAL HIGH (ref 70–99)

## 2020-05-14 MED ORDER — VANCOMYCIN HCL 1500 MG/300ML IV SOLN
1500.0000 mg | Freq: Two times a day (BID) | INTRAVENOUS | Status: DC
  Administered 2020-05-14 – 2020-05-16 (×4): 1500 mg via INTRAVENOUS
  Filled 2020-05-14 (×5): qty 300

## 2020-05-14 NOTE — TOC Initial Note (Signed)
Transition of Care [facility]) - Initial/Assessment Note    Patient Details  Name: Harold Torres MRN: [id number] Date of Birth: 10-16-1975  [facility]) CM/SW Contact:    Pollie Friar, RN Phone Number: 05/14/2020, 4:53 PM  Clinical Narrative:                 Using the Mandarin interpreter, CM met with the patient to go over d/c planning. He has support at home from spouse for IV abx . Pam with [employer] met with the patient and his spouse and went over education on IV abx administration.  HH arranged through [facility]. Cory with [facility] accepted the referral.  Awaiting PICC line placement. TOC following.  Expected Discharge Plan: [place] Barriers to Discharge: Continued Medical Work up   Patient Goals and CMS Choice   CMS Medicare.gov Compare Post Acute Care list provided to:: Patient Choice offered to / list presented to : Patient  Expected Discharge Plan and Services Expected Discharge Plan: [place]   Discharge Planning Services: CM Consult Post Acute Care Choice: [place] arrangements for the past 2 months: [place]: RN [facility] Agency: [place] Date Jun[place]: 05/14/20   Representative spoke with at [place]: Tommi Rumps  Prior Living Arrangements/Services Living arrangements for the past 2 months: [place] Lives with:: Spouse Patient language and need for interpreter reviewed:: Yes Do you feel safe going back to the place where you live?: Yes      Need for Family Participation in Patient Care: Yes (Comment) Care giver support system in place?: Yes (comment)   Criminal Activity/Legal Involvement Pertinent to Current Situation/Hospitalization: No - Comment as needed  Activities of Daily Living      Permission Sought/Granted                  Emotional Assessment Appearance:: Appears stated age Attitude/Demeanor/Rapport:  Engaged Affect (typically observed): Accepting Orientation: : Oriented to Self, Oriented to Place, Oriented to  Time, Oriented to Situation   Psych Involvement: No (comment)  Admission diagnosis:  Postoperative wound infection [T81.49XA] Surgical wound infection [T81.49XA] Patient Active Problem List   Diagnosis Date Noted  . Hep B w/o coma 03/24/2020  . Postoperative wound infection 03/21/2020  . Meningioma ([place]) 03/14/2020  . Meningioma determined by biopsy of brain ([place]) 02/08/2020  . 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

## 2020-05-14 NOTE — Progress Notes (Signed)
PHARMACY CONSULT NOTE FOR:  OUTPATIENT  PARENTERAL ANTIBIOTIC THERAPY (OPAT)  Indication: Wound infection post neurosurgery  Regimen: ceftriaxone 2g IV q12h, Vancomycin 1500mg  IV q12h  End date: 06/04/2020  IV antibiotic discharge orders are pended. To discharging provider:  please sign these orders via discharge navigator,  Select New Orders & click on the button choice - Manage This Unsigned Work.     Thank you for allowing pharmacy to be a part of this patient's care.  Phillis Haggis 05/14/2020, 10:27 AM

## 2020-05-14 NOTE — Progress Notes (Signed)
Patient ID: Harold Torres, male   DOB: 1976/02/01, 44 y.o.   MRN: [id number] BP 106/78 (BP Location: Right Arm)   Pulse 78   Temp 98 F (36.7 C) (Oral)   Resp 18   Wt 89.4 kg   SpO2 97%   BMI 32.80 kg/m  Alert and oriented x 4, speech is clear and fluent Normal exam Wound is clean, dry, no signs of infection.

## 2020-05-14 NOTE — Progress Notes (Signed)
Pharmacy Antibiotic Note  Harold Torres is a 44 y.o. male admitted on 05/04/2020 with craniotomy incision infection.  Pharmacy has been consulted for Vancomycin dosing. Also on Ceftriaxone per MD.  ID has been consulted and recommended PICC with planned duration of 4 weeks from last OR (cranial plate removal on 58/5). The patient is noted to have a therapeutic Vancomycin trough on 11/9 on q8h dosing. Will attempt to adjust to q12h dosing in anticipation of likely discharge home with home health.    Plan: - Adjust Vancomycin to 1500 mg IV every 12 hours (starting at 2000 today) - Continue Rocephin 2g IV every 12 hours - Will plan to check a VT at steady state on the new dose - BMET at least q72h while on Vancomycin, weekly VT once on stable dose - ID planning 4 week LOT using 11/3 as D#1 - planned thru 11/30  Height: 65 inches Weight: 89.4 kg (197 lb 1.5 oz)  IBW; 57 kg   Temp (24hrs), Avg:98.2 F (36.8 C), Min:97.8 F (36.6 C), Max:98.3 F (36.8 C)  Recent Labs  Lab 05/07/20 1111 05/10/20 0252 05/13/20 1108  CREATININE  --  0.84 0.82  VANCOTROUGH 15  --  15    Estimated Creatinine Clearance: 120.7 mL/min (by C-G formula based on SCr of 0.82 mg/dL).    No Known Allergies  Antimicrobials this admission: Vancomycin 10/31> Ceftriaxone 10/31>  Dose adjustments this admission:  11/1: empiric Vanc regimen 1 gm q12h > q8h for CNS penetration  11/3: VT 15 mcg/ml - continue Vanc 1gm IV q8h  11/1: Cetriaxone 1 > 2gm IV q12h for CNS penetration  11/9: VT 15 mcg/ml - cont 1 g IV q8h  Microbiology results: 11/3 superficial surgical wound: rare Klebsiella, sens to CTX and all others, except R to Amp 11/3 cranial flap: rare Klebsiella (as above) and rare MRSE - MIC to vanc < 0.5, sens to TCN, Gent < 0.5.  R Oxa, Cipro, Emycin, Septra, Clinda 11/3 MRSA PCR: negative 10/31 COVID and flu: negative  Thank you for allowing pharmacy to be a part of this patient's care.  Alycia Rossetti, PharmD, BCPS Clinical Pharmacist Clinical phone for 05/14/2020: [phone] 05/14/2020 8:38 AM   **Pharmacist phone directory can now be found on [email or web address] ([email or web address]).  Listed under [place].

## 2020-05-15 ENCOUNTER — Ambulatory Visit: Payer: 59

## 2020-05-15 DIAGNOSIS — T8149XA Infection following a procedure, other surgical site, initial encounter: Secondary | ICD-10-CM | POA: Diagnosis not present

## 2020-05-15 LAB — GLUCOSE, CAPILLARY: Glucose-Capillary: 127 mg/dL — ABNORMAL HIGH (ref 70–99)

## 2020-05-15 MED ORDER — VANCOMYCIN IV (FOR PTA / DISCHARGE USE ONLY): 1500.0000 mg | Freq: Two times a day (BID) | INTRAVENOUS | 0 refills | Status: AC

## 2020-05-15 MED ORDER — CEFTRIAXONE IV (FOR PTA / DISCHARGE USE ONLY): 2.0000 g | Freq: Two times a day (BID) | INTRAVENOUS | 0 refills | Status: AC

## 2020-05-15 MED ORDER — SODIUM CHLORIDE 0.9% FLUSH: 10.0000 mL | INTRAVENOUS | Status: DC | PRN

## 2020-05-15 MED ORDER — CHLORHEXIDINE GLUCONATE CLOTH 2 % EX PADS
6.0000 | MEDICATED_PAD | Freq: Every day | CUTANEOUS | Status: DC
  Administered 2020-05-15 – 2020-05-16 (×2): 6 via TOPICAL

## 2020-05-15 MED ORDER — SODIUM CHLORIDE 0.9% FLUSH
10.0000 mL | Freq: Two times a day (BID) | INTRAVENOUS | Status: DC
  Administered 2020-05-15 – 2020-05-16 (×2): 10 mL

## 2020-05-15 NOTE — TOC Progression Note (Signed)
Transition of Care [facility]) - Progression Note    Patient Details  Name: Harlow Carrizales MRN: [id number] Date of Birth: 1976-04-24  Transition [facility]) CM/SW Contact  Pollie Friar, RN Phone Number: 05/15/2020, 4:16 PM  Clinical Narrative:    Dr Christella Noa asked that ID sign the OPAT orders for home IV abx. CM has asked ID to pleas sign. Per [facility] they wont have time to get IV abx to home tonight. Plan is for pt to d/c home tomorrow after am dose of IV abx.  TOC following.   Expected Discharge Plan: [place] Barriers to Discharge: Continued Medical Work up  Expected Discharge Plan and Services Expected Discharge Plan: [place]   Discharge Planning Services: CM Consult Post Acute Care Choice: [place] arrangements for the past 2 months: [place]: RN [facility] Agency: [place] Date [place]: 05/14/20   Representative spoke with at [place]: [place] ([place]) Interventions    Readmission Risk Interventions No flowsheet data found.

## 2020-05-15 NOTE — Progress Notes (Signed)
Patient ID: Harold Torres, male   DOB: [birth year], 44 y.o.   MRN: [id number] BP 123/73 (BP Location: Right Arm)   Pulse 84   Temp 98.1 F (36.7 C) (Oral)   Resp 16   Wt 89.4 kg   SpO2 96%   BMI 32.80 kg/m  Alert and oriented x 4, speech is clear and fluent Moving all extremities well Wound is clean, dry, no signs of infection Will discharge tomorrow.

## 2020-05-15 NOTE — Progress Notes (Signed)
Peripherally Inserted Central Catheter Placement  The IV Nurse has discussed with the patient and/or persons authorized to consent for the patient, the purpose of this procedure and the potential benefits and risks involved with this procedure.  The benefits include less needle sticks, lab draws from the catheter, and the patient may be discharged home with the catheter. Risks include, but not limited to, infection, bleeding, blood clot (thrombus formation), and puncture of an artery; nerve damage and irregular heartbeat and possibility to perform a PICC exchange if needed/ordered by physician.  Alternatives to this procedure were also discussed.  Bard Power PICC patient education guide, fact sheet on infection prevention and patient information card has been provided to patient /or left at bedside. Video Interpreter [phone] used to obtain consent.  PICC Placement Documentation  PICC Single Lumen 05/15/20 PICC Right Brachial 39 cm 1 cm (Active)  Indication for Insertion or Continuance of Line Home intravenous therapies (PICC only) 05/15/20 1420  Exposed Catheter (cm) 1 cm 05/15/20 1420  Site Assessment Clean;Dry;Intact 05/15/20 1420  Line Status Flushed;Saline locked;Blood return noted 05/15/20 1420  Dressing Type Transparent;Securing device 05/15/20 1420  Dressing Status Clean;Dry;Intact 05/15/20 1420  Antimicrobial disc in place? Yes 05/15/20 1420  Safety Lock Not Applicable 91/50/56 [id number]  Line Care Connections checked and tightened 05/15/20 1420  Dressing Intervention New dressing 05/15/20 1420  Dressing Change Due 05/22/20 05/15/20 1420       Janani Chamber Ortiz 05/15/2020, 2:26 PM

## 2020-05-15 NOTE — Progress Notes (Addendum)
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 All hardware out.   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 No flare currently. LFTs WNL.  Patient can follow up with his prior provider Dr Cindee Salt [facility] ) or [facility] if he desires.   Medication Monitoring - Cr has been stable, Vanc trough 15.   Recommendations  Continue Vancomycin, pharmacy to dose and ceftriaxone ( CNS dosing) He will need a prolonged course of treatment at least 4 weeks from date of last OR 11/3 given recurrence of infection with previous short course of 2 weeks of IV abx. Tentative end date is 06/04/20 PICC line  Monitor CBC, CMP and Vanc trough  while on IV abx  ESR and CRP baseline and weely thereafter Labs to be faxed at [place] myself at [phone] Follow up with myself at [place] on 12/1 at 10:15 am Will sign off for now  Diagnosis: Craniotomy wound infection  Culture Result: MRSE, Klebsiella pneumoniae   No Known Allergies  OPAT Orders Discharge antibiotics to be given via PICC line Discharge antibiotics: Vancomycin and ceftriaxone  Per pharmacy protocol  Aim for Vancomycin trough 15-20 or AUC 400-550 (unless otherwise indicated) Duration: 4 weeks from date of OR  End Date: 06/04/20  [facility] Care Per Protocol:  Home health RN for IV administration and teaching; PICC line care and labs.    Labs weekly while on IV antibiotics: X_ CBC with differential __ BMP X_ CMP X_ CRP X__ ESR X_ Vancomycin  trough __ CK  __ Please pull PIC at completion of IV antibiotics X_ Please leave PIC in place until doctor has seen patient or been notified  Fax weekly labs to [phone]  Clinic Follow Up Appt: [facility] on 12/1 at 10:15 am with Dr [place]   Rest of the management as per the primary team. Thank you for the consult. Please page with pertinent questions or concerns.  Rosiland Oz, MD Infectious [place] for Infectious Diseases   To contact the attending provider between 8A-5P or the covering provider during after hours 5P-8A, please log into the web site [email or web address] and access using universal [place] password for that web site. If you do not have the password, please call the hospital operator. ______________________________________________________________________ Subjective patient seen and examined at the bedside with the help of video interpreter. Denies any complaints. Planned to ho with [place] with IV abx. Denies any headache, blurry vision. No issues with the antibiotics  Vitals BP 108/73 (BP Location: Right Arm)   Pulse 74   Temp 97.7 F (36.5 C) (Oral)   Resp 16   Wt 89.4 kg   SpO2 95%   BMI 32.80 kg/m     Physical Exam Constitutional:  NAD    Comments: scalp wound has nicely healed and looks dry   Cardiovascular:     Rate and Rhythm: Normal rate and regular rhythm.     Heart sounds: No murmur heard.   Pulmonary:     Effort: Pulmonary effort is normal.     Comments: clear air entry bilaterally  Abdominal:  Palpations: Abdomen is soft.     Tenderness: Non tender  Musculoskeletal:        General: No swelling or tenderness.   Skin:    Comments: No obvious skin rashes, lesions  Neurological:     General: No focal deficit present.   Psychiatric:        Mood and Affect: Mood normal.    LINES/TUBES: PIV   Pertinent Microbiology Results for orders placed or performed during the hospital encounter of 05/04/20  Resp Panel by  RT PCR (RSV, Flu A&B, Covid) - Nasopharyngeal Swab     Status: None   Collection Time: 05/04/20 11:34 AM   Specimen: Nasopharyngeal Swab  Result Value Ref Range Status   SARS Coronavirus 2 by RT PCR NEGATIVE NEGATIVE Final    Comment: (NOTE) SARS-CoV-2 target nucleic acids are NOT DETECTED.  The SARS-CoV-2 RNA is generally detectable in upper respiratoy specimens during the acute phase of infection. The lowest concentration of SARS-CoV-2 viral copies this assay can detect is 131 copies/mL. A negative result does not preclude SARS-Cov-2 infection and should not be used as the sole basis for treatment or other patient management decisions. A negative result may occur with  improper specimen collection/handling, submission of specimen other than nasopharyngeal swab, presence of viral mutation(s) within the areas targeted by this assay, and inadequate number of viral copies (<131 copies/mL). A negative result must be combined with clinical observations, patient history, and epidemiological information. The expected result is Negative.  Fact Sheet for Patients:  PinkCheek.be  Fact Sheet for Healthcare Providers:  GravelBags.it  This test is no t yet approved or cleared by the [place] FDA and  has been authorized for detection and/or diagnosis of SARS-CoV-2 by FDA under an Emergency Use Authorization (EUA). This EUA will remain  in effect (meaning this test can be used) for the duration of the COVID-19 declaration under Section 564(b)(1) of the Act, 21 U.S.C. section 360bbb-3(b)(1), unless the authorization is terminated or revoked sooner.     Influenza A by PCR NEGATIVE NEGATIVE Final   Influenza B by PCR NEGATIVE NEGATIVE Final    Comment: (NOTE) The Xpert Xpress SARS-CoV-2/FLU/RSV assay is intended as an aid in  the diagnosis of influenza from Nasopharyngeal swab specimens and  should not be used as a sole basis for  treatment. Nasal washings and  aspirates are unacceptable for Xpert Xpress SARS-CoV-2/FLU/RSV  testing.  Fact Sheet for Patients: PinkCheek.be  Fact Sheet for Healthcare Providers: GravelBags.it  This test is not yet approved or cleared by the [place] FDA and  has been authorized for detection and/or diagnosis of SARS-CoV-2 by  FDA under an Emergency Use Authorization (EUA). This EUA will remain  in effect (meaning this test can be used) for the duration of the  Covid-19 declaration under Section 564(b)(1) of the Act, 21  U.S.C. section 360bbb-3(b)(1), unless the authorization is  terminated or revoked.    Respiratory Syncytial Virus by PCR NEGATIVE NEGATIVE Final    Comment: (NOTE) Fact Sheet for Patients: PinkCheek.be  Fact Sheet for Healthcare Providers: GravelBags.it  This test is not yet approved or cleared by the [place] FDA and  has been authorized for detection and/or diagnosis of SARS-CoV-2 by  FDA under an Emergency Use Authorization (EUA). This EUA will remain  in effect (meaning this test can be used) for the duration of the  COVID-19 declaration under Section 564(b)(1) of the Act, 21 U.S.C.  section 360bbb-3(b)(1), unless the authorization is terminated or  revoked. Performed at [facility], [place] [address], [place], [place] [address]   Surgical pcr screen     Status: None   Collection Time: 05/07/20  3:57 AM   Specimen: Nasal Mucosa; Nasal Swab  Result Value Ref Range Status   MRSA, PCR NEGATIVE NEGATIVE Final   Staphylococcus aureus NEGATIVE NEGATIVE Final    Comment: (NOTE) The Xpert SA Assay (FDA approved for NASAL specimens in patients 50 years of age and older), is one component of a comprehensive surveillance program. It is not intended to diagnose infection nor to guide or monitor treatment. Performed at [facility], [place] [address], [place], [place] [address]   Aerobic/Anaerobic Culture (surgical/deep wound)     Status: None   Collection Time: 05/07/20  5:11 PM   Specimen: Wound  Result Value Ref Range Status   Specimen Description WOUND  Final   Special Requests CRANIAL SURGICAL WOUND  Final   Gram Stain   Final    RARE WBC PRESENT, PREDOMINANTLY MONONUCLEAR NO ORGANISMS SEEN    Culture   Final    RARE MULTIPLE ORGANISMS PRESENT, NONE PREDOMINANT NO GROUP A STREP (S.PYOGENES) ISOLATED NO STAPHYLOCOCCUS AUREUS ISOLATED NO ANAEROBES ISOLATED Performed at [facility], [place] [address] [place], [place] [address]    Report Status 05/12/2020 FINAL  Final  Aerobic/Anaerobic Culture (surgical/deep wound)     Status: None   Collection Time: 05/07/20  5:35 PM   Specimen: Wound  Result Value Ref Range Status   Specimen Description WOUND  Final   Special Requests CRANIAL SURGICAL WOUND SUPERFICIAL  Final   Gram Stain   Final    MODERATE WBC PRESENT, PREDOMINANTLY PMN NO ORGANISMS SEEN    Culture   Final    RARE KLEBSIELLA PNEUMONIAE NO ANAEROBES ISOLATED Performed at [facility], [place] [address] [place], [place] [address]    Report Status 05/12/2020 FINAL  Final   Organism ID, Bacteria KLEBSIELLA PNEUMONIAE  Final      Susceptibility   Klebsiella pneumoniae - MIC*    AMPICILLIN >=32 RESISTANT Resistant     CEFAZOLIN <=4 SENSITIVE Sensitive     CEFEPIME <=0.12 SENSITIVE Sensitive     CEFTAZIDIME <=1 SENSITIVE Sensitive     CEFTRIAXONE <=0.25 SENSITIVE Sensitive     CIPROFLOXACIN <=0.25 SENSITIVE Sensitive     GENTAMICIN <=1 SENSITIVE Sensitive     IMIPENEM <=0.25 SENSITIVE Sensitive     TRIMETH/SULFA <=20 SENSITIVE Sensitive     AMPICILLIN/SULBACTAM 4 SENSITIVE Sensitive     PIP/TAZO <=4 SENSITIVE Sensitive     * RARE KLEBSIELLA PNEUMONIAE  Aerobic/Anaerobic Culture (surgical/deep wound)     Status: None   Collection Time: 05/07/20  5:52 PM   Specimen: Wound    Result Value Ref Range Status   Specimen Description WOUND  Final   Special Requests CRANIAL FLAP  Final   Gram Stain   Final    FEW WBC PRESENT, PREDOMINANTLY PMN NO ORGANISMS SEEN    Culture   Final    RARE KLEBSIELLA PNEUMONIAE RARE STAPHYLOCOCCUS EPIDERMIDIS NO ANAEROBES ISOLATED Performed at [facility], [address] [place], [place] [address]    Report Status 05/12/2020 FINAL  Final   Organism ID, Bacteria KLEBSIELLA PNEUMONIAE  Final   Organism ID, Bacteria STAPHYLOCOCCUS EPIDERMIDIS  Final      Susceptibility   Klebsiella pneumoniae - MIC*    AMPICILLIN RESISTANT Resistant     CEFAZOLIN <=4 SENSITIVE Sensitive  CEFEPIME <=0.12 SENSITIVE Sensitive     CEFTAZIDIME <=1 SENSITIVE Sensitive     CEFTRIAXONE <=0.25 SENSITIVE Sensitive     CIPROFLOXACIN <=0.25 SENSITIVE Sensitive     GENTAMICIN <=1 SENSITIVE Sensitive     IMIPENEM <=0.25 SENSITIVE Sensitive     TRIMETH/SULFA <=20 SENSITIVE Sensitive     AMPICILLIN/SULBACTAM 4 SENSITIVE Sensitive     PIP/TAZO <=4 SENSITIVE Sensitive     * RARE KLEBSIELLA PNEUMONIAE   Staphylococcus epidermidis - MIC*    CIPROFLOXACIN >=8 RESISTANT Resistant     ERYTHROMYCIN >=8 RESISTANT Resistant     GENTAMICIN <=0.5 SENSITIVE Sensitive     OXACILLIN >=4 RESISTANT Resistant     TETRACYCLINE 2 SENSITIVE Sensitive     VANCOMYCIN <=0.5 SENSITIVE Sensitive     TRIMETH/SULFA 80 RESISTANT Resistant     CLINDAMYCIN >=8 RESISTANT Resistant     RIFAMPIN <=0.5 SENSITIVE Sensitive     Inducible Clindamycin NEGATIVE Sensitive     * RARE STAPHYLOCOCCUS EPIDERMIDIS    Pertinent Lab. CBC Latest Ref Rng & Units 05/04/2020 05/04/2020 04/17/2020  WBC 4.0 - 10.5 K/uL 9.0 10.2 4.7  Hemoglobin 13.0 - 17.0 g/dL 10.6(L) 11.5(L) 10.4(L)  Hematocrit 39 - 52 % 34.9(L) 37.4(L) 34.4(L)  Platelets 150 - 400 K/uL 179 253 227   CMP Latest Ref Rng & Units 05/13/2020 05/10/2020 05/06/2020  Glucose 70 - 99 mg/dL 138(H) 225(H) 239(H)  BUN 6 - 20 mg/dL _0 Creatinine 0.61 - 1.24 mg/dL 0.82 0.84 0.79  Sodium 135 - 145 mmol/L 141 138 138  Potassium 3.5 - 5.1 mmol/L 3.9 3.7 3.4(L)  Chloride 98 - 111 mmol/L 105 105 103  CO2 22 - 32 mmol/L _1 Calcium 8.9 - 10.3 mg/dL 8.9 8.4(L) 8.6(L)  Total Protein 6.5 - 8.1 g/dL - - -  Total Bilirubin 0.3 - 1.2 mg/dL - - -  Alkaline Phos 38 - 126 U/L - - -  AST 15 - 41 U/L - - -  ALT 0 - 44 U/L - - -    Pertinent Imaging today Plain films and CT images have been personally visualized and interpreted; radiology reports have been reviewed. Decision making incorporated into the Impression / Recommendations.  I have spent approx 30 minutes for this patient encounter including review of prior medical records with greater than 50% of time being face to face and coordination of their care.

## 2020-05-16 ENCOUNTER — Ambulatory Visit: Payer: 59

## 2020-05-16 LAB — BASIC METABOLIC PANEL
Anion gap: 7 (ref 5–15)
BUN: 11 mg/dL (ref 6–20)
CO2: 26 mmol/L (ref 22–32)
Calcium: 8.9 mg/dL (ref 8.9–10.3)
Chloride: 105 mmol/L (ref 98–111)
Creatinine, Ser: 0.8 mg/dL (ref 0.61–1.24)
GFR, Estimated: >60 mL/min (ref >60)
Glucose, Bld: 167 mg/dL — ABNORMAL HIGH (ref 70–99)
Potassium: 3.9 mmol/L (ref 3.5–5.1)
Sodium: 138 mmol/L (ref 135–145)

## 2020-05-16 LAB — GLUCOSE, CAPILLARY: Glucose-Capillary: 131 mg/dL — ABNORMAL HIGH (ref 70–99)

## 2020-05-16 NOTE — Discharge Instructions (Signed)
Wound Infection A wound infection happens when germs start to grow in a wound. Germs that cause wound infections are most often bacteria. Other types of infections can occur as well. An infection can cause the wound to break open. Wound infections need treatment. If a wound infection is not treated, problems can happen. What are the causes?  Most often caused by germs (bacteria) that grow in a wound.  Other germs, such as yeast and funguses, can also cause wound infections. What increases the risk?  Having a weak body defense system (immune system).  Having diabetes.  Taking certain medicines (steroids) for a long time.  Smoking.  Being an older person.  Being overweight.  Taking certain medicines for cancer treatment. What are the signs or symptoms?  Having more redness, swelling, or pain at the wound site.  Having more blood or fluid at the wound site.  A bad smell coming from a wound or bandage (dressing).  Having a fever.  Feeling very tired.  Having warmth at or around the wound.  Having pus at the wound site. How is this treated?  This condition is most often treated with an antibiotic medicine. ? The infection should improve 24-48 hours after you start antibiotics. ? After 24-48 hours, redness around the wound should stop spreading. The wound should also be less painful. Follow these instructions at home: Medicines  Take or apply over-the-counter and prescription medicines only as told by your doctor.  If you were prescribed an antibiotic medicine, take or apply it as told by your doctor. Do not stop using the antibiotic even if you start to feel better. Wound care   Clean the wound each day, or as told by your doctor. ? Wash the wound with mild soap and water. ? Rinse the wound with water to remove all soap. ? Pat the wound dry with a clean towel. Do not rub it.  Follow instructions from your doctor about how to take care of your wound. Make sure  you: ? Wash your hands with soap and water before and after you change your bandage. If you cannot use soap and water, use hand sanitizer. ? Change your bandage as told by your doctor. ? Leave stitches (sutures), skin glue, or skin tape (adhesive) strips in place if your wound has been closed. They may need to stay in place for 2 weeks or longer. If tape strips get loose and curl up, you may trim the loose edges. Do not remove tape strips completely unless your doctor says it is okay. Some wounds are left open to heal on their own.  Check your wound every day for signs of infection. Watch for: ? More redness, swelling, or pain. ? More fluid or blood. ? Warmth. ? Pus or a bad smell. General instructions  Keep the bandage dry until your doctor says it can be removed.  Do not take baths, swim, or use a hot tub until your doctor approves. Ask your doctor if you may take showers. You may only be allowed to take sponge baths.  Raise (elevate) the injured area above the level of your heart while you are sitting or lying down.  Do not scratch or pick at the wound.  Keep all follow-up visits as told by your doctor. This is important. Contact a doctor if:  Medicine does not help your pain.  You have more redness, swelling, or pain around your wound.  You have more fluid or blood coming from your wound.  Your wound feels warm to the touch.  You have pus coming from your wound.  You notice a bad smell coming from your wound or your bandage.  Your wound that was closed breaks open. Get help right away if:  You have a red streak going away from your wound.  You have a fever. Summary  A wound infection happens when germs start to grow in a wound.  This condition is usually treated with an antibiotic medicine.  Follow instructions from your doctor about how to take care of your wound.  Contact a doctor if your wound infection does not start to get better in 24-48 hours, or your  symptoms get worse.  Keep all follow-up visits as told by your doctor. This is important. This information is not intended to replace advice given to you by your health care provider. Make sure you discuss any questions you have with your health care provider. Document Revised: 01/31/2018 Document Reviewed: 01/31/2018 Elsevier Patient Education  [place].

## 2020-05-16 NOTE — TOC Transition Note (Signed)
Transition of Care [facility]) - CM/SW Discharge Note   Patient Details  Name: Harold Torres MRN: [id number] Date of Birth: 1976-06-06  Transition of Care [facility] CM/SW Contact:  Pollie Friar, RN Phone Number: 05/16/2020, 11:12 AM   Clinical Narrative:    Pt is discharging home today with IV abx. PICC is in place. CM verified with [employer] that IV abx will be delivered to the home today. CM has updated [place] with [facility] that pt is d/cing today. The RN through [facility] will see pt tomorrow. CM verified with Pt using interpretor services that he and his spouse feel comfortable administering the IV abx tonight. He is aware the RN will see him tomorrow and that IV abx will be delivered this pm. Wife to provide transport home.   Final next level of care: Home w Home Health Services Barriers to Discharge: No Barriers Identified   Patient Goals and CMS Choice   CMS Medicare.gov Compare Post Acute Care list provided to:: Patient Choice offered to / list presented to : Patient  Discharge Placement                       Discharge Plan and Services   Discharge Planning Services: CM Consult Post Acute Care Choice: Home Health                    HH Arranged: RN [facility] Agency: [place] Date [place]: 05/14/20   Representative spoke with at [place]: [place] ([place]) Interventions     Readmission Risk Interventions No flowsheet data found.

## 2020-05-16 NOTE — Plan of Care (Signed)
  Problem: Education: Goal: Knowledge of General Education information will improve Description: Including pain rating scale, medication(s)/side effects and non-pharmacologic comfort measures Outcome: Adequate for Discharge   

## 2020-05-16 NOTE — Discharge Summary (Signed)
Physician Discharge Summary  Patient ID: Harold Torres MRN: [id number] DOB/AGE: 07-Jul-1975 44 y.o.  Admit date: 05/04/2020 Discharge date: 05/16/2020  Admission Diagnoses:wound infection  Discharge Diagnoses: wound infection Active Problems:   Postoperative wound infection   Discharged Condition: good  Hospital Course: Mr. Barich for the second time was admitted and taken to the operating room to remove his peek skull implant secondary to a wound infection, with purulent drainage. He will be discharged with IV abx treatment. This will be for 4 weeks. His wound at discharge is clean, without signs of infection. He is neurologically normal.   Treatments: surgery: as Above  Discharge Exam: Blood pressure 103/74, pulse 73, temperature 98.1 F (36.7 C), temperature source Oral, resp. rate 16, weight 89.4 kg, SpO2 95 %. General appearance: alert, cooperative and no distress Neurologic: Alert and oriented X 3, normal strength and tone. Normal symmetric reflexes. Normal coordination and gait  Disposition: Discharge disposition: 01-Home or Self Care      WOUND INFECTION Discharge Instructions    [facility] pharmacist to adjust dose for Vancomycin, Aminoglycosides and other anti-infective therapies as requested by physician.   Complete by: As directed    [facility] to provide Cath Flo 2mg    Complete by: As directed    Administer for PICC line occlusion and as ordered by physician for other access device issues.   Anaphylaxis Kit: Provided to treat any anaphylactic reaction to the medication being provided to the patient if First Dose or when requested by physician   Complete by: As directed    Epinephrine 1mg /ml vial / amp: Administer 0.3mg  (0.28ml) subcutaneously once for moderate to severe anaphylaxis, nurse to call physician and pharmacy when reaction occurs and call 911 if needed for immediate care   Diphenhydramine 50mg /ml IV vial: Administer 25-50mg  IV/IM  PRN for first dose reaction, rash, itching, mild reaction, nurse to call physician and pharmacy when reaction occurs   Sodium Chloride 0.9% NS 547ml IV: Administer if needed for hypovolemic blood pressure drop or as ordered by physician after call to physician with anaphylactic reaction   Change dressing on IV access line weekly and PRN   Complete by: As directed    Flush IV access with Sodium Chloride 0.9% and Heparin 10 units/ml or 100 units/ml   Complete by: As directed    Home infusion instructions - [facility]   Complete by: As directed    Instructions: Flush IV access with Sodium Chloride 0.9% and Heparin 10units/ml or 100units/ml   Change dressing on IV access line: Weekly and PRN   Instructions Cath Flo 2mg : Administer for PICC Line occlusion and as ordered by physician for other access device   [facility] pharmacist to adjust dose for: Vancomycin, Aminoglycosides and other anti-infective therapies as requested by physician   Method of administration may be changed at the discretion of home infusion pharmacist based upon assessment of the patient and/or caregiver's ability to self-administer the medication ordered   Complete by: As directed      Allergies as of 05/16/2020   No Known Allergies     Medication List    TAKE these medications   BACITRACIN ZINC EX Apply 1 application topically at bedtime.   cefTRIAXone  IVPB Commonly known as: ROCEPHIN Inject 2 g into the vein every 12 (twelve) hours for 21 days. Indication:  Wound infection post neurosurgery  First Dose: No Last Day of Therapy:  06/04/2020 Labs - Once weekly:  CBC/D and BMP,  Labs - Every other week:  ESR and CRP Method of administration: IV Push Method of administration may be changed at the discretion of home infusion pharmacist based upon assessment of the patient and/or caregiver's ability to self-administer the medication ordered.   cetirizine 10 MG tablet Commonly known as:  ZYRTEC Take 10 mg by mouth daily.   metFORMIN 500 MG 24 hr tablet Commonly known as: GLUCOPHAGE-XR Take 1,500 mg by mouth in the morning and at bedtime.   oxyCODONE 5 MG immediate release tablet Commonly known as: Oxy IR/ROXICODONE Take 5 mg by mouth every 6 (six) hours as needed for severe pain.   vancomycin  IVPB Inject 1,500 mg into the vein every 12 (twelve) hours for 21 days. Indication:  Wound infection post neurosurgery  First Dose: No Last Day of Therapy:  06/04/2020 Labs - _0 /11/21 1553          Follow-up Information    [facility] Follow up.   Specialty: Home Health Services Why: The home health agency will contact you for the first home visit. Contact information: [place] [address] [phone]        Ashok Pall, MD Follow up in 1 week(s).   Specialty: Neurosurgery Why: make an appointment for suture removal Contact information: [address] [place] [address] [place] [address] [phone]               Signed: Ashok Pall 05/16/2020, 10:53 AM

## 2020-05-16 NOTE — Progress Notes (Signed)
Discharge instructions (including medications) discussed with and copy provided to patient/caregiver 

## 2020-05-19 ENCOUNTER — Ambulatory Visit: Payer: 59

## 2020-05-19 ENCOUNTER — Other Ambulatory Visit: Payer: Self-pay | Admitting: Radiation Therapy

## 2020-05-19 DIAGNOSIS — D329 Benign neoplasm of meninges, unspecified: Secondary | ICD-10-CM

## 2020-05-19 NOTE — Addendum Note (Signed)
Addended by: Pincus Large on: 05/19/2020 11:08 AM   Modules accepted: Orders

## 2020-05-20 ENCOUNTER — Ambulatory Visit: Payer: 59

## 2020-05-21 ENCOUNTER — Ambulatory Visit: Payer: 59

## 2020-05-22 ENCOUNTER — Ambulatory Visit: Payer: 59

## 2020-05-23 ENCOUNTER — Ambulatory Visit: Payer: 59

## 2020-05-26 ENCOUNTER — Ambulatory Visit: Payer: 59

## 2020-05-27 ENCOUNTER — Ambulatory Visit: Payer: 59

## 2020-05-28 ENCOUNTER — Ambulatory Visit: Payer: 59

## 2020-06-02 ENCOUNTER — Ambulatory Visit: Payer: 59

## 2020-06-03 ENCOUNTER — Ambulatory Visit: Payer: 59

## 2020-06-04 ENCOUNTER — Encounter: Payer: Self-pay | Admitting: Infectious Diseases

## 2020-06-04 ENCOUNTER — Other Ambulatory Visit: Payer: Self-pay

## 2020-06-04 ENCOUNTER — Ambulatory Visit (INDEPENDENT_AMBULATORY_CARE_PROVIDER_SITE_OTHER): Payer: 59 | Admitting: Infectious Diseases

## 2020-06-04 ENCOUNTER — Ambulatory Visit: Payer: 59

## 2020-06-04 VITALS — BP 102/70 | HR 89 | Temp 98.0°F | Wt 201.0 lb

## 2020-06-04 DIAGNOSIS — B1910 Unspecified viral hepatitis B without hepatic coma: Secondary | ICD-10-CM | POA: Diagnosis not present

## 2020-06-04 DIAGNOSIS — T8149XA Infection following a procedure, other surgical site, initial encounter: Secondary | ICD-10-CM | POA: Diagnosis not present

## 2020-06-04 NOTE — Assessment & Plan Note (Signed)
DC Vanc and ceftriaxone today  PICC line to be removed FU with Neuro SX

## 2020-06-04 NOTE — Patient Instructions (Signed)
Follow up with NeuroSx

## 2020-06-04 NOTE — Assessment & Plan Note (Signed)
Will schedule a fu at [facility] if patient prefers

## 2020-06-04 NOTE — Progress Notes (Addendum)
[facility]                                                             [place], [place], [place], [address]                                                                  Phn. 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 All hardware out.   Hepatitis B -  No treatment so far. Patient can follow up with his prior provider Dr Cindee [facility] ) or [facility] office. Will ask RN to check where he prefers to follow up  Medication monitoring   Pertinent labs  05/20/20 Cr 0.55, WBC 5.9, hb 10.7, platelets 253, Cr 0.71, Vanc trough 5.7, ESR 23 and CRp <1 05/22/20 CR 0.73, Vancomycin trough 7.9 05/26/20 WBC 4.9, hb 11.1, platelets 258, Cr 0.75, Vanc trough 18.1  Plan Patient has completed 4 weeks of IV Vancomycin and IV ceftriaxone today with significant improvement in the healing of the cranial wound. Will DC both IV antibiotics today. PICC line to come out by [facility] nurse tomorrow  Will make a follow up appointment with [facility] for Hep B if he prefers to follow up with [place].  Follow up with NeuroSurgery as per their recommendations   I spent greater than 25 minutes with the patient including  review of prior medical records with greater than 50% of time in face to face counsel of the patient.    Rosiland Oz, MD [facility]  Office phone [phone] Fax no. [phone] ______________________________________________________________________________________________________________________  HPI: 44 YO male with a history of occipital craniotomy for rhabdoid meningioma in in 01/2020  followed by craniectomy for the tumor in 8/21 then cranioplasty in 9/21 ( graft using plates and screws). Post operatively , he developed a wound infection. He underwent I and D with peek cranioplasty implant removed on 03/21/20. OR cx grew Kleb pnuemo. He was treated with 2 weeks of IV ceftriaxone and then underwent replacement of the flap with a wound closure in 04/04/20.  He underwent wound repair on 10/14 and was discharged on 05/04/20. Upon follow up with NSx , the wound opened in the middle of the incision  approx 1.5cm and also had purulent drainage for 3 -4 days prior to admit on 05/04/20.  Went to OR on 05/07/20 for removal of the cranial plate where they  encountered very little purulence, the peek cranioplasty implant and plates were removed. OR cultures grew Kleb pneumo again with MRSE. All hardware out , no concerns of bone/deeper infection per [facility]. Patient  Was discharged on 11/12 to complete a 4 week course of Vancomycin and ceftriaxone post OR date.   Patient seen with In person interpreter. He denies any complaints today. Denies any pain/tenderness and swelling at the surgical site in the scalp. Denies any drainage. Denies any headache, blurry vision, dizziness, ear pain, etc. Denies any issues witth the PICC line. Denies any side effects with the IV abx.   Overall doing well, no complaints today   ROS: Constitutional: Negative for fever, chills, activity change, appetite change, fatigue and unexpected weight change.  HENT: Negative for congestion, sore throat, rhinorrhea, sneezing, trouble swallowing and sinus pressure.  Eyes: Negative for photophobia and visual disturbance.  Respiratory: Negative for cough, chest tightness, shortness of breath, wheezing and stridor.  Cardiovascular: Negative for chest pain, palpitations and leg swelling.  Gastrointestinal: Negative for nausea, vomiting, abdominal pain, diarrhea, constipation, blood in stool, abdominal distention and anal bleeding.   Genitourinary: Negative for dysuria, hematuria, flank pain and difficulty urinating.  Musculoskeletal: Negative for myalgias, back pain, joint swelling, arthralgias and gait problem.  Skin: Negative for color change, pallor, rash and wound.  Neurological: Negative for dizziness, tremors, weakness and light-headedness.  Hematological: Negative for adenopathy. Does not bruise/bleed easily.  Psychiatric/Behavioral: Negative for behavioral problems, confusion, sleep disturbance, dysphoric mood, decreased concentration and agitation.   Past Medical History:  Diagnosis Date  . Diabetes mellitus without complication ([place])    type 2  . Generalized skin cysts    back x 2, left leg, hand  . Hepatitis    Hepatitis B   . Meningioma ([place])   . Seasonal allergies    Past Surgical History:  Procedure Laterality Date  . CRANIOPLASTY N/A 03/14/2020   Procedure: CRANIOPLASTY;  Surgeon: Ashok Pall, MD;  Location: [place];  Service: Neurosurgery;  Laterality: N/A;  . CRANIOPLASTY N/A 03/21/2020   Procedure: Cranial wound incision and drainage;  Surgeon: Ashok Pall, MD;  Location: [place];  Service: Neurosurgery;  Laterality: N/A;  Cranial wound incision and drainage  . CRANIOPLASTY N/A 04/04/2020   Procedure: Replacement of prosthetic bone flap;  Surgeon: Ashok Pall, MD;  Location: [place];  Service: Neurosurgery;  Laterality: N/A;  . CRANIOPLASTY N/A 05/07/2020   Procedure: REMOVAL OF CRANIAL PLATE;  Surgeon: Ashok Pall, MD;  Location: [place];  Service: Neurosurgery;  Laterality: N/A;  . CRANIOTOMY Bilateral 01/31/2020   Procedure: OCCIPITAL CRANIOTOMY FOR MASS;  Surgeon: Ashok Pall, MD;  Location: [place];  Service: Neurosurgery;  Laterality: Bilateral;  . CRANIOTOMY N/A 02/08/2020   Procedure: CRANIECTOMY FOR TUMOR;  Surgeon: Ashok Pall, MD;  Location: [place];  Service: Neurosurgery;  Laterality: N/A;  CRANIECTOMY FOR TUMOR  . WOUND EXPLORATION N/A 04/17/2020   Procedure: Scalp Wound Repair;   Surgeon: Ashok Pall, MD;  Location: [place];  Service: Neurosurgery;  Laterality: N/A;  3C   No Known Allergies  Current Outpatient Medications on File Prior to Visit  Medication Sig Dispense Refill  . BACITRACIN ZINC EX Apply 1 application topically at bedtime.    . cefTRIAXone (ROCEPHIN) IVPB Inject 2 g into the vein every 12 (twelve) hours for 21 days. Indication:  Wound infection post neurosurgery  First Dose: No Last Day of Therapy:  06/04/2020 Labs - Once weekly:  CBC/D and BMP, Labs - Every other week:  ESR and CRP Method of administration: IV Push Method of administration may be changed at the discretion of home infusion pharmacist based upon assessment of the patient and/or caregiver's ability to self-administer the medication ordered. 42 Units 0  . cetirizine (ZYRTEC) 10 MG tablet Take 10 mg by mouth daily.    . metFORMIN (GLUCOPHAGE-XR) 500 MG 24 hr tablet Take 1,500 mg by mouth in the morning and at bedtime.     Marland Kitchen oxyCODONE (OXY IR/ROXICODONE) 5 MG immediate release tablet Take 5 mg by mouth every 6 (six) hours as needed for severe pain.    . vancomycin IVPB Inject 1,500 mg into the vein every 12 (twelve) hours for 21 days. Indication:  Wound infection post neurosurgery  First Dose: No Last Day of Therapy:  06/04/2020 Labs - Sunday/Monday:  CBC/D, BMP, and vancomycin trough. Labs - Thursday:  BMP and vancomycin trough Labs - Every other week:  ESR and CRP Method of administration:Elastomeric Method of administration may be changed at the discretion of the patient and/or caregiver's ability to self-administer the medication ordered. 42 Units 0   No current facility-administered medications on file prior to visit.   Social History   Socioeconomic History  . Marital status: Married    Spouse name: Not on file  . Number of children: Not on file  . Years of education: Not on file  . Highest education level: Not on file  Occupational History  . Not on file  Tobacco Use  .  Smoking status: Former Smoker    Types: Cigarettes  . Smokeless tobacco: Never Used  . Tobacco comment: at age [age]  Vaping Use  . Vaping Use: Never used  Substance and Sexual Activity  . Alcohol use: Never  . Drug use: Never  . Sexual activity: Yes    Birth control/protection: Condom  Other Topics Concern  . Not on file  Social History Narrative  . Not on file   Social Determinants of Health   Financial Resource Strain:   . Difficulty of Paying Living Expenses: Not on file  Food Insecurity:   . Worried About Charity fundraiser in the Last Year: Not on file  . Ran Out of Food in the Last Year: Not on file  Transportation Needs:   . Lack of Transportation (Medical): Not on file  . Lack of Transportation (Non-Medical): Not on file  Physical Activity:   . Days of Exercise per Week: Not on file  . Minutes of Exercise per Session: Not on file  Stress:   . Feeling of Stress : Not on file  Social Connections:   . Frequency of Communication with Friends and Family: Not on file  . Frequency of Social Gatherings with Friends and Family: Not on file  . Attends Religious Services: Not on file  . Active Member of Clubs or Organizations: Not on file  . Attends Archivist Meetings: Not on file  . Marital Status: Not on file  Intimate Partner Violence:   . Fear of Current or Ex-Partner: Not on file  . Emotionally Abused: Not on file  . Physically Abused: Not on file  . Sexually Abused: Not on file     Vitals BP 102/70   Pulse 89   Temp 98 F (36.7 C) (Oral)   Wt 201 lb (91.2 kg)   BMI 33.45 kg/m    Examination  General - not in acute distress, comfortably sitting in chair HEENT - PEERLA, no pallor and no icterus  Chest - b/l clear air entry, no additional sounds CVS- Normal s1s2, RRR Abdomen - Soft, Non tender , non distended Ext- no pedal edema Neuro: grossly normal Back - WNL Psych : calm and cooperative   Recent labs CBC Latest Ref Rng &  Units 05/04/2020 05/04/2020 04/17/2020  WBC 4.0 - 10.5 K/uL 9.0 10.2 4.7  Hemoglobin 13.0 - 17.0 g/dL 10.6(L) 11.5(L) 10.4(L)  Hematocrit 39 - 52 % 34.9(L) 37.4(L) 34.4(L)  Platelets 150 - 400 K/uL 179 253 227   CMP Latest Ref Rng & Units 05/16/2020 05/13/2020 05/10/2020  Glucose 70 - 99 mg/dL 167(H) 138(H) 225(H)  BUN 6 - 20 mg/dL 11 8 14   Creatinine 0.61 - 1.24 mg/dL 0.80 0.82 0.84  Sodium 135 - 145 mmol/L 138 141 138  Potassium 3.5 - 5.1 mmol/L 3.9 3.9 3.7  Chloride 98 - 111 mmol/L 105 105 105  CO2 22 - 32 mmol/L 26 27 25   Calcium 8.9 - 10.3 mg/dL 8.9 8.9 8.4(L)  Total Protein 6.5 - 8.1 g/dL - - -  Total Bilirubin 0.3 - 1.2 mg/dL - - -  Alkaline Phos 38 - 126 U/L - - -  AST 15 - 41 U/L - - -  ALT 0 - 44 U/L - - -     Pertinent Microbiology Results for orders placed or performed during the hospital encounter of 05/04/20  Resp Panel by RT PCR (RSV, Flu A&B, Covid) - Nasopharyngeal Swab     Status: None   Collection Time: 05/04/20 11:34 AM   Specimen: Nasopharyngeal Swab  Result Value Ref Range Status   SARS Coronavirus 2 by RT PCR NEGATIVE NEGATIVE Final    Comment: (NOTE) SARS-CoV-2 target nucleic acids are NOT DETECTED.  The SARS-CoV-2 RNA is generally detectable in upper respiratoy specimens during the acute phase of infection. The lowest concentration of SARS-CoV-2 viral copies this assay can detect is 131 copies/mL. A negative result does not preclude SARS-Cov-2 infection and should not be used as the sole basis for treatment or other patient management decisions. A negative result may occur with  improper specimen collection/handling, submission of specimen other than nasopharyngeal swab, presence of viral mutation(s) within the areas targeted by this assay, and inadequate number of viral copies (<131 copies/mL). A negative result must be combined with clinical observations, patient history, and epidemiological information. The expected result is Negative.  Fact  Sheet for Patients:  PinkCheek.be  Fact Sheet for Healthcare Providers:  GravelBags.it  This test is no t yet approved or cleared by the [place] FDA and  has been authorized for detection and/or diagnosis of SARS-CoV-2 by FDA under an Emergency Use Authorization (EUA). This EUA will remain  in effect (meaning this test can be used) for the duration of the COVID-19 declaration under Section 564(b)(1) of the Act, 21 U.S.C. section 360bbb-3(b)(1), unless the authorization is terminated or revoked sooner.     Influenza A by PCR NEGATIVE NEGATIVE Final   Influenza B by PCR NEGATIVE NEGATIVE Final    Comment: (NOTE) The Xpert Xpress SARS-CoV-2/FLU/RSV assay is intended as an aid in  the diagnosis of influenza from Nasopharyngeal swab specimens and  should not be used as a sole basis for treatment. Nasal washings and  aspirates are unacceptable for Xpert Xpress SARS-CoV-2/FLU/RSV  testing.  Fact Sheet for Patients: PinkCheek.be  Fact Sheet for Healthcare Providers: GravelBags.it  This test is not yet approved or cleared by the [place] FDA and  has been authorized for detection and/or diagnosis of  SARS-CoV-2 by  FDA under an Emergency Use Authorization (EUA). This EUA will remain  in effect (meaning this test can be used) for the duration of the  Covid-19 declaration under Section 564(b)(1) of the Act, 21  U.S.C. section 360bbb-3(b)(1), unless the authorization is  terminated or revoked.    Respiratory Syncytial Virus by PCR NEGATIVE NEGATIVE Final    Comment: (NOTE) Fact Sheet for Patients: PinkCheek.be  Fact Sheet for Healthcare Providers: GravelBags.it  This test is not yet approved or cleared by the [place] FDA and  has been authorized for detection and/or diagnosis of SARS-CoV-2 by  FDA  under an Emergency Use Authorization (EUA). This EUA will remain  in effect (meaning this test can be used) for the duration of the  COVID-19 declaration under Section 564(b)(1) of the Act, 21 U.S.C.  section 360bbb-3(b)(1), unless the authorization is terminated or  revoked. Performed at [facility], [place] [address] [place], [place] [address]   Surgical pcr screen     Status: None   Collection Time: 05/07/20  3:57 AM   Specimen: Nasal Mucosa; Nasal Swab  Result Value Ref Range Status   MRSA, PCR NEGATIVE NEGATIVE Final   Staphylococcus aureus NEGATIVE NEGATIVE Final    Comment: (NOTE) The Xpert SA Assay (FDA approved for NASAL specimens in patients 38 years of age and older), is one component of a comprehensive surveillance program. It is not intended to diagnose infection nor to guide or monitor treatment. Performed at [facility], [place] [address], [place], [place] [address]   Aerobic/Anaerobic Culture (surgical/deep wound)     Status: None   Collection Time: 05/07/20  5:11 PM   Specimen: Wound  Result Value Ref Range Status   Specimen Description WOUND  Final   Special Requests CRANIAL SURGICAL WOUND  Final   Gram Stain   Final    RARE WBC PRESENT, PREDOMINANTLY MONONUCLEAR NO ORGANISMS SEEN    Culture   Final    RARE MULTIPLE ORGANISMS PRESENT, NONE PREDOMINANT NO GROUP A STREP (S.PYOGENES) ISOLATED NO STAPHYLOCOCCUS AUREUS ISOLATED NO ANAEROBES ISOLATED Performed at [facility], [place] [address], [place], [place] [address]    Report Status 05/12/2020 FINAL  Final  Aerobic/Anaerobic Culture (surgical/deep wound)     Status: None   Collection Time: 05/07/20  5:35 PM   Specimen: Wound  Result Value Ref Range Status   Specimen Description WOUND  Final   Special Requests CRANIAL SURGICAL WOUND SUPERFICIAL  Final   Gram Stain   Final    MODERATE WBC PRESENT, PREDOMINANTLY PMN NO ORGANISMS SEEN    Culture   Final    RARE KLEBSIELLA PNEUMONIAE NO  ANAEROBES ISOLATED Performed at [facility], [place] [address] [place], [place] [address]    Report Status 05/12/2020 FINAL  Final   Organism ID, Bacteria KLEBSIELLA PNEUMONIAE  Final      Susceptibility   Klebsiella pneumoniae - MIC*    AMPICILLIN >=32 RESISTANT Resistant     CEFAZOLIN <=4 SENSITIVE Sensitive     CEFEPIME <=0.12 SENSITIVE Sensitive     CEFTAZIDIME <=1 SENSITIVE Sensitive     CEFTRIAXONE <=0.25 SENSITIVE Sensitive     CIPROFLOXACIN <=0.25 SENSITIVE Sensitive     GENTAMICIN <=1 SENSITIVE Sensitive     IMIPENEM <=0.25 SENSITIVE Sensitive     TRIMETH/SULFA <=20 SENSITIVE Sensitive     AMPICILLIN/SULBACTAM 4 SENSITIVE Sensitive     PIP/TAZO <=4 SENSITIVE Sensitive     * RARE  KLEBSIELLA PNEUMONIAE  Aerobic/Anaerobic Culture (surgical/deep wound)     Status: None   Collection Time: 05/07/20  5:52 PM   Specimen: Wound  Result Value Ref Range Status   Specimen Description WOUND  Final   Special Requests CRANIAL FLAP  Final   Gram Stain   Final    FEW WBC PRESENT, PREDOMINANTLY PMN NO ORGANISMS SEEN    Culture   Final    RARE KLEBSIELLA PNEUMONIAE RARE STAPHYLOCOCCUS EPIDERMIDIS NO ANAEROBES ISOLATED Performed at [facility], [address] [place], [place] [address]    Report Status 05/12/2020 FINAL  Final   Organism ID, Bacteria KLEBSIELLA PNEUMONIAE  Final   Organism ID, Bacteria STAPHYLOCOCCUS EPIDERMIDIS  Final      Susceptibility   Klebsiella pneumoniae - MIC*    AMPICILLIN RESISTANT Resistant     CEFAZOLIN <=4 SENSITIVE Sensitive     CEFEPIME <=0.12 SENSITIVE Sensitive     CEFTAZIDIME <=1 SENSITIVE Sensitive     CEFTRIAXONE <=0.25 SENSITIVE Sensitive     CIPROFLOXACIN <=0.25 SENSITIVE Sensitive     GENTAMICIN <=1 SENSITIVE Sensitive     IMIPENEM <=0.25 SENSITIVE Sensitive     TRIMETH/SULFA <=20 SENSITIVE Sensitive     AMPICILLIN/SULBACTAM 4 SENSITIVE Sensitive     PIP/TAZO <=4 SENSITIVE Sensitive     * RARE KLEBSIELLA PNEUMONIAE    Staphylococcus epidermidis - MIC*    CIPROFLOXACIN >=8 RESISTANT Resistant     ERYTHROMYCIN >=8 RESISTANT Resistant     GENTAMICIN <=0.5 SENSITIVE Sensitive     OXACILLIN >=4 RESISTANT Resistant     TETRACYCLINE 2 SENSITIVE Sensitive     VANCOMYCIN <=0.5 SENSITIVE Sensitive     TRIMETH/SULFA 80 RESISTANT Resistant     CLINDAMYCIN >=8 RESISTANT Resistant     RIFAMPIN <=0.5 SENSITIVE Sensitive     Inducible Clindamycin NEGATIVE Sensitive     * RARE STAPHYLOCOCCUS EPIDERMIDIS      All pertinent labs/Imagings/notes reviewed. All pertinent plain films and CT images have been personally visualized and interpreted; radiology reports have been reviewed. Decision making incorporated into the Impression / Recommendations.

## 2020-06-05 ENCOUNTER — Telehealth: Payer: Self-pay

## 2020-06-05 NOTE — Telephone Encounter (Signed)
Could you please make a fu appointment with me in my earliest available date?

## 2020-06-05 NOTE — Telephone Encounter (Signed)
Spoke with pt with assistance from medical interpreter regarding providers message. Pt would like to come to [facility] for Hep B management.  Will forward message to provider.

## 2020-06-05 NOTE — Telephone Encounter (Signed)
-----   Message from Rosiland Oz, MD sent at 06/04/2020 12:22 PM EST ----- Regarding: Hep B Could you please check with the patient whether he would follow up for Hep B with his previous provider at [facility] ? If not, we can schedule a follow up visit for him at [facility] for Hep b management

## 2020-06-09 ENCOUNTER — Encounter: Payer: Self-pay | Admitting: Infectious Diseases

## 2020-06-09 NOTE — Progress Notes (Signed)
Labs  05/28/20 Cr 0.71, Na 136, k 4.1Vanc trough 14.7 06/02/20 WBC 5.3, hb 11.3, platelets 212, Cr 0.75, Vanc trough 18, ESR 13, CRP <1

## 2020-06-13 ENCOUNTER — Ambulatory Visit (INDEPENDENT_AMBULATORY_CARE_PROVIDER_SITE_OTHER): Payer: 59

## 2020-06-13 ENCOUNTER — Other Ambulatory Visit: Payer: Self-pay

## 2020-06-13 DIAGNOSIS — Z23 Encounter for immunization: Secondary | ICD-10-CM

## 2020-06-13 NOTE — Progress Notes (Signed)
   Covid-19 Vaccination Clinic  Name:  Harold Torres    MRN: [id number] DOB: 1975-08-01  06/13/2020  Mr. Burnsworth was observed post Covid-19 immunization for 15 minutes without incident. He was provided with Vaccine Information Sheet and instruction to access the V-Safe system.   Mr. Bonelli was instructed to call 911 with any severe reactions post vaccine: Marland Kitchen Difficulty breathing  . Swelling of face and throat  . A fast heartbeat  . A bad rash all over body  . Dizziness and weakness   Immunizations Administered    Name Date Dose VIS Date Route   Pfizer COVID-19 Vaccine 06/13/2020 10:14 AM 0.3 mL 04/23/2020 Intramuscular   Manufacturer: [place]   Lot: [id number]   NDC: [place], RN

## 2020-06-19 ENCOUNTER — Encounter (HOSPITAL_COMMUNITY): Payer: Self-pay

## 2020-06-23 ENCOUNTER — Ambulatory Visit (HOSPITAL_COMMUNITY)
Admission: RE | Admit: 2020-06-23 | Discharge: 2020-06-23 | Disposition: A | Discharge status: Home / Self Care | Payer: 59 | Source: Ambulatory Visit | Attending: Radiation Oncology | Admitting: Radiation Oncology

## 2020-06-23 ENCOUNTER — Other Ambulatory Visit: Payer: Self-pay

## 2020-06-23 DIAGNOSIS — D329 Benign neoplasm of meninges, unspecified: Secondary | ICD-10-CM | POA: Diagnosis not present

## 2020-06-23 MED ORDER — GADOBUTROL 1 MMOL/ML IV SOLN
9.0000 mL | Freq: Once | INTRAVENOUS | Status: AC | PRN
  Administered 2020-06-23: 11:00:00 9 mL via INTRAVENOUS

## 2020-06-25 ENCOUNTER — Other Ambulatory Visit: Payer: Self-pay

## 2020-06-25 ENCOUNTER — Ambulatory Visit
Admit: 2020-06-25 | Discharge: 2020-06-25 | Disposition: A | Discharge status: Home / Self Care | Payer: 59 | Attending: Radiation Oncology | Admitting: Radiation Oncology

## 2020-06-25 ENCOUNTER — Ambulatory Visit
Admission: RE | Admit: 2020-06-25 | Discharge: 2020-06-25 | Disposition: A | Discharge status: Home / Self Care | Payer: 59 | Source: Ambulatory Visit | Attending: Radiation Oncology | Admitting: Radiation Oncology

## 2020-06-25 ENCOUNTER — Ambulatory Visit: Admission: RE | Admit: 2020-06-25 | Payer: 59 | Source: Ambulatory Visit | Admitting: Radiation Oncology

## 2020-06-25 ENCOUNTER — Encounter: Payer: Self-pay | Admitting: Radiation Oncology

## 2020-06-25 VITALS — BP 107/78 | HR 87 | Temp 98.2°F | Resp 18 | Ht 65.0 in | Wt 197.6 lb

## 2020-06-25 DIAGNOSIS — D320 Benign neoplasm of cerebral meninges: Secondary | ICD-10-CM | POA: Diagnosis not present

## 2020-06-25 DIAGNOSIS — E119 Type 2 diabetes mellitus without complications: Secondary | ICD-10-CM | POA: Insufficient documentation

## 2020-06-25 DIAGNOSIS — D329 Benign neoplasm of meninges, unspecified: Secondary | ICD-10-CM | POA: Insufficient documentation

## 2020-06-25 DIAGNOSIS — B1910 Unspecified viral hepatitis B without hepatic coma: Secondary | ICD-10-CM | POA: Insufficient documentation

## 2020-06-25 DIAGNOSIS — Z87891 Personal history of nicotine dependence: Secondary | ICD-10-CM | POA: Insufficient documentation

## 2020-06-25 DIAGNOSIS — Z7984 Long term (current) use of oral hypoglycemic drugs: Secondary | ICD-10-CM | POA: Diagnosis not present

## 2020-06-26 NOTE — Progress Notes (Signed)
Radiation Oncology         [phone] ________________________________  Name: Harold Torres        MRN: [id number]  Date of Service: 06/25/2020 DOB: 1975/07/11  CC:Pcp, No  Ashok Pall, MD     REFERRING PHYSICIAN: Ashok Pall, MD   DIAGNOSIS: The encounter diagnosis was Meningioma [facility]).   HISTORY OF PRESENT ILLNESS: Harold Torres is a 44 y.o. male seen at the request of Dr. Christella Noa for a recently diagnosed rhabdoid meningioma.  The patient presented after approximately a 80-month period of time where he was noticing increasing in a lesion of the posterior scalp.  His primary care and dermatologist sent him to plastic surgery, and further imaging by CT on 12/20/2019 revealed a 9.9 x 8.3 x 4.2 cm soft tissue mass with peripheral calcifications in the posterior scalp causing significant destruction/erosion of the adjacent posterior calvarium, he was referred to Dr. Christella Noa and underwent occipital craniotomy for mass on 01/31/2020.  Biopsy from that timeframe revealed a meningioma and biopsies were obtained and consistent with a grade 3 rhabdoid meningioma.  The patient had such destruction of the calvarium that he went back for surgery again on 02/08/2020 for further resection of the tumor, he then underwent cranioplasty on 03/14/2020, and subsequent incision and drainage following an infection on 03/21/2020.  On 04/04/2020 he underwent replacement of the prosthetic bone flap, and we saw him postoperatively in October.  The plan originally was to proceed with repeat imaging once he had been cleared surgically however the patient did develop fluid accumulation requiring a an additional wound repair on 04/17/2020, unfortunately he did go on to develop repeat infection at the site and it was ultimately determined that he needed to go back to the operating room for removal of the cranial plate which was also performed on 05/07/2020. He did undergo an MRI of the brain with and without contrast on 06/23/2020  that showed prior resection of the parieto-occipital mass with intracranial and extracranial components, there was interval removal of the parieto-occipital cranial plate and the previously demonstrated fluid collection was no longer present smooth enhancing tissue at the parieto-occipital craniotomy site abutting the dura and extending into the overlapping scalp was again noted without discrete masslike or nodular enhancement.  A few punctate foci of encephalomalacia were noted in the right parietal lobe, and chronic blood products within bilateral cerebellar hemispheres were seen consistent with the site of prior hemorrhage.  No midline shift or extra-axial fluid collection was identified, he is seen today to proceed with postoperative radiotherapy.   PREVIOUS RADIATION THERAPY: No   PAST MEDICAL HISTORY:  Past Medical History:  Diagnosis Date  . Diabetes mellitus without complication ([place])    type 2  . Generalized skin cysts    back x 2, left leg, hand  . Hepatitis    Hepatitis B   . Meningioma ([place])   . Seasonal allergies        PAST SURGICAL HISTORY: Past Surgical History:  Procedure Laterality Date  . CRANIOPLASTY N/A 03/14/2020   Procedure: CRANIOPLASTY;  Surgeon: Ashok Pall, MD;  Location: [place];  Service: Neurosurgery;  Laterality: N/A;  . CRANIOPLASTY N/A 03/21/2020   Procedure: Cranial wound incision and drainage;  Surgeon: Ashok Pall, MD;  Location: [place];  Service: Neurosurgery;  Laterality: N/A;  Cranial wound incision and drainage  . CRANIOPLASTY N/A 04/04/2020   Procedure: Replacement of prosthetic bone flap;  Surgeon: Ashok Pall, MD;  Location: [place];  Service: Neurosurgery;  Laterality: N/A;  . CRANIOPLASTY N/A 05/07/2020   Procedure: REMOVAL OF CRANIAL PLATE;  Surgeon: Ashok Pall, MD;  Location: [place];  Service: Neurosurgery;  Laterality: N/A;  . CRANIOTOMY Bilateral 01/31/2020   Procedure: OCCIPITAL CRANIOTOMY FOR MASS;  Surgeon: Ashok Pall, MD;   Location: [place];  Service: Neurosurgery;  Laterality: Bilateral;  . CRANIOTOMY N/A 02/08/2020   Procedure: CRANIECTOMY FOR TUMOR;  Surgeon: Ashok Pall, MD;  Location: [place];  Service: Neurosurgery;  Laterality: N/A;  CRANIECTOMY FOR TUMOR  . WOUND EXPLORATION N/A 04/17/2020   Procedure: Scalp Wound Repair;  Surgeon: Ashok Pall, MD;  Location: [place];  Service: Neurosurgery;  Laterality: N/A;  3C     FAMILY HISTORY: No family history on file.   SOCIAL HISTORY:  reports that he has quit smoking. His smoking use included cigarettes. He has never used smokeless tobacco. He reports that he does not drink alcohol and does not use drugs. The patient is married and lives in [place]. He has a newborn baby at home born in the midst of his recent health issues. He works at a [employer].   ALLERGIES: Patient has no known allergies.   MEDICATIONS:  Current Outpatient Medications  Medication Sig Dispense Refill  . BACITRACIN ZINC EX Apply 1 application topically at bedtime.    . cetirizine (ZYRTEC) 10 MG tablet Take 10 mg by mouth daily.    . metFORMIN (GLUCOPHAGE-XR) 500 MG 24 hr tablet Take 1,500 mg by mouth in the morning and at bedtime.     Marland Kitchen oxyCODONE (OXY IR/ROXICODONE) 5 MG immediate release tablet Take 5 mg by mouth every 6 (six) hours as needed for severe pain.     No current facility-administered medications for this encounter.     REVIEW OF SYSTEMS: On review of systems the patient reports that he is doing extremely well overall, he is now back to his normal routine and denies any concerns with headaches.  He states that while the incision line feels a little strange because of the changes in his anatomy, he is not having pain at that area drainage redness or palpable heat.  He denies any fevers.  No other complaints are verbalized.  PHYSICAL EXAM:  Wt Readings from Last 3 Encounters:  06/25/20 197 lb 9.6 oz (89.6 kg)  06/04/20 201 lb (91.2 kg)  05/05/20 197 lb 1.5 oz  (89.4 kg)   Temp Readings from Last 3 Encounters:  06/25/20 98.2 F (36.8 C)  06/04/20 98 F (36.7 C) (Oral)  05/16/20 98.1 F (36.7 C) (Oral)   BP Readings from Last 3 Encounters:  06/25/20 107/78  06/04/20 102/70  05/16/20 103/74   Pulse Readings from Last 3 Encounters:  06/25/20 87  06/04/20 89  05/16/20 73   Pain Assessment Pain Score: 0-No pain/10  In general this is a well appearing Asian male in no acute distress.  He's alert and oriented x4 and appropriate throughout the examination. Cardiopulmonary assessment is negative for acute distress and he exhibits normal effort. His posterior scalp incision is well healed with hypertrophic scarring, and the site in general does have some concave any to it given the lack of reconstruction.  ECOG = 0  0 - Asymptomatic (Fully active, able to carry on all predisease activities without restriction)  1 - Symptomatic but completely ambulatory (Restricted in physically strenuous activity but ambulatory and able to carry out work of a light or sedentary nature. For example, light housework, office work)  2 - Symptomatic, <50%  in bed during the day (Ambulatory and capable of all self care but unable to carry out any work activities. Up and about more than 50% of waking hours)  3 - Symptomatic, >50% in bed, but not bedbound (Capable of only limited self-care, confined to bed or chair 50% or more of waking hours)  4 - Bedbound (Completely disabled. Cannot carry on any self-care. Totally confined to bed or chair)  5 - Death   Eustace Pen MM, Creech RH, Tormey DC, et al. [phone]). "Toxicity and response criteria of the [facility]". [place] Oncol. 5 (6): 649-55    LABORATORY DATA:  Lab Results  Component Value Date   WBC 9.0 05/04/2020   HGB 10.6 (L) 05/04/2020   HCT 34.9 (L) 05/04/2020   MCV 76.7 (L) 05/04/2020   PLT 179 05/04/2020   Lab Results  Component Value Date   NA 138 05/16/2020   K 3.9 05/16/2020    CL 105 05/16/2020   CO2 26 05/16/2020   Lab Results  Component Value Date   ALT 25 05/04/2020   AST 20 05/04/2020   ALKPHOS 81 05/04/2020   BILITOT 0.4 05/04/2020      RADIOGRAPHY: MR Brain W Wo Contrast  Result Date: 06/23/2020 CLINICAL DATA:  Meningioma. Benign neoplasm, brain/CNS; postop follow-up. Patient had infection with surgery to remove cranial plate. EXAM: MRI HEAD WITHOUT AND WITH CONTRAST TECHNIQUE: Multiplanar, multiecho pulse sequences of the brain and surrounding structures were obtained without and with intravenous contrast. CONTRAST:  72mL GADAVIST GADOBUTROL 1 MMOL/ML IV SOLN COMPARISON:  Prior head CT examinations 05/04/2020 and earlier. Prior brain MRI examinations 02/09/2020 and earlier. FINDINGS: Brain: Prior resection of a midline parietooccipital mass with both intracranial and extracranial components. Since the prior head CT of 05/04/2020, there has been interval removal of a parietooccipital cranial plate. The previously demonstrated fluid collection at this site is no longer present. There is smooth enhancing tissue at the parietooccipital craniectomy site, abutting the dura and extending into the overlying scalp. No discrete region of masslike or nodular enhancement is identified. Cerebral volume is normal. A few punctate foci of cortical encephalomalacia are questioned within the right parietal lobe. No significant white matter disease for age. Chronic blood products within the bilateral cerebellar hemispheres at site of prior hemorrhage. There is no acute infarct. No extra-axial fluid collection. No midline shift. Partially empty sella turcica. Vascular: Expected proximal arterial flow voids. The superior sagittal sinus is patent. Skull and upper cervical spine: Prior midline parietooccipital craniectomy. Sinuses/Orbits: Visualized orbits show no acute finding. Trace ethmoid sinus mucosal thickening. IMPRESSION: Prior resection of a midline parietooccipital meningioma  with both intracranial and extracranial components. Since the prior head CT of 05/04/2020, there has been interval removal of a parietooccipital cranial plate due to a wound infection. The fluid collection previously demonstrated at this site is no longer present. Smooth enhancing tissue at the parietooccipital craniectomy site, abutting the dura and extending into the overlying scalp. There is no discrete masslike or nodular enhancement and this may reflect granulation tissue. Continued attention recommended on follow-up. Otherwise stable MRI appearance of the brain, as described. Electronically Signed   By: Kellie Simmering DO   On: 06/23/2020 11:57       IMPRESSION/PLAN: 1. Grade 3 Rhabdoid Meningioma.  We reviewed the rationale for proceeding with radiotherapy now that the patient is been given clearance and is fully healed from his most recent surgical procedure.  Dr. Lisbeth Renshaw has previously recommended a course of  6 weeks of radiotherapy, and his case had been reviewed in conference earlier this week where attendance agreed that it was appropriate to now proceed.  There was no significant change in his dural enhancement.  Given the time that has passed since our last discussion we reviewed the risks, benefits, long and short effects of therapy and the patient is interested in moving forward.  This was all performed with the assistance of a Mandarin interpreter. Written consent is obtained and placed in the chart, a copy was provided to the patient for the appropriate time.  He will simulate this afternoon, and begin treatment the first week of January 2022.  In a visit lasting 45 minutes, greater than 50% of the time was spent face to face discussing the patient's condition, in preparation for the discussion, and coordinating the patient's care in the presence of an interpreter.     Carola Rhine, PAC

## 2020-07-02 DIAGNOSIS — D329 Benign neoplasm of meninges, unspecified: Secondary | ICD-10-CM | POA: Diagnosis not present

## 2020-07-07 ENCOUNTER — Other Ambulatory Visit: Payer: Self-pay

## 2020-07-07 ENCOUNTER — Ambulatory Visit
Admission: RE | Admit: 2020-07-07 | Discharge: 2020-07-07 | Disposition: A | Discharge status: Home / Self Care | Payer: 59 | Source: Ambulatory Visit | Attending: Radiation Oncology | Admitting: Radiation Oncology

## 2020-07-07 DIAGNOSIS — D329 Benign neoplasm of meninges, unspecified: Secondary | ICD-10-CM | POA: Diagnosis not present

## 2020-07-08 ENCOUNTER — Ambulatory Visit
Admission: RE | Admit: 2020-07-08 | Discharge: 2020-07-08 | Disposition: A | Discharge status: Home / Self Care | Payer: 59 | Source: Ambulatory Visit | Attending: Radiation Oncology | Admitting: Radiation Oncology

## 2020-07-08 ENCOUNTER — Other Ambulatory Visit: Payer: Self-pay

## 2020-07-08 DIAGNOSIS — D329 Benign neoplasm of meninges, unspecified: Secondary | ICD-10-CM | POA: Diagnosis not present

## 2020-07-09 ENCOUNTER — Other Ambulatory Visit: Payer: Self-pay

## 2020-07-09 ENCOUNTER — Ambulatory Visit
Admission: RE | Admit: 2020-07-09 | Discharge: 2020-07-09 | Disposition: A | Discharge status: Home / Self Care | Payer: 59 | Source: Ambulatory Visit | Attending: Radiation Oncology | Admitting: Radiation Oncology

## 2020-07-09 DIAGNOSIS — D329 Benign neoplasm of meninges, unspecified: Secondary | ICD-10-CM | POA: Diagnosis not present

## 2020-07-10 ENCOUNTER — Ambulatory Visit
Admission: RE | Admit: 2020-07-10 | Discharge: 2020-07-10 | Disposition: A | Discharge status: Home / Self Care | Payer: 59 | Source: Ambulatory Visit | Attending: Radiation Oncology | Admitting: Radiation Oncology

## 2020-07-10 DIAGNOSIS — D329 Benign neoplasm of meninges, unspecified: Secondary | ICD-10-CM | POA: Diagnosis not present

## 2020-07-11 ENCOUNTER — Ambulatory Visit
Admission: RE | Admit: 2020-07-11 | Discharge: 2020-07-11 | Disposition: A | Discharge status: Home / Self Care | Payer: 59 | Source: Ambulatory Visit | Attending: Radiation Oncology | Admitting: Radiation Oncology

## 2020-07-11 DIAGNOSIS — D329 Benign neoplasm of meninges, unspecified: Secondary | ICD-10-CM | POA: Diagnosis not present

## 2020-07-11 MED ORDER — SONAFINE EX EMUL
1.0000 "application " | Freq: Once | CUTANEOUS | Status: AC
  Administered 2020-07-11: 1 via TOPICAL

## 2020-07-11 NOTE — Progress Notes (Signed)
Pt here for patient teaching.  Pt given Radiation and You booklet, skin care instructions and Sonafine.  Reviewed areas of pertinence such as fatigue, hair loss, nausea and vomiting, skin changes, headache and blurry vision . Pt able to give teach back of to pat skin and use unscented/gentle soap,apply Sonafine bid, avoid applying anything to skin within 4 hours of treatment, avoid wearing an under wire bra and to use an electric razor if they must shave. Pt verbalizes understanding of information given and will contact nursing with any questions or concerns.  Interpreter used to conduct education.    Gloriajean Dell. Leonie Green, BSN

## 2020-07-14 ENCOUNTER — Ambulatory Visit
Admission: RE | Admit: 2020-07-14 | Discharge: 2020-07-14 | Disposition: A | Discharge status: Home / Self Care | Payer: 59 | Source: Ambulatory Visit | Attending: Radiation Oncology | Admitting: Radiation Oncology

## 2020-07-14 ENCOUNTER — Other Ambulatory Visit: Payer: Self-pay

## 2020-07-14 DIAGNOSIS — D329 Benign neoplasm of meninges, unspecified: Secondary | ICD-10-CM | POA: Diagnosis not present

## 2020-07-15 ENCOUNTER — Ambulatory Visit
Admission: RE | Admit: 2020-07-15 | Discharge: 2020-07-15 | Disposition: A | Discharge status: Home / Self Care | Payer: 59 | Source: Ambulatory Visit | Attending: Radiation Oncology | Admitting: Radiation Oncology

## 2020-07-15 DIAGNOSIS — D329 Benign neoplasm of meninges, unspecified: Secondary | ICD-10-CM | POA: Diagnosis not present

## 2020-07-16 ENCOUNTER — Ambulatory Visit
Admission: RE | Admit: 2020-07-16 | Discharge: 2020-07-16 | Disposition: A | Discharge status: Home / Self Care | Payer: 59 | Source: Ambulatory Visit | Attending: Radiation Oncology | Admitting: Radiation Oncology

## 2020-07-16 DIAGNOSIS — D329 Benign neoplasm of meninges, unspecified: Secondary | ICD-10-CM | POA: Diagnosis not present

## 2020-07-17 ENCOUNTER — Ambulatory Visit
Admission: RE | Admit: 2020-07-17 | Discharge: 2020-07-17 | Disposition: A | Discharge status: Home / Self Care | Payer: 59 | Source: Ambulatory Visit | Attending: Radiation Oncology | Admitting: Radiation Oncology

## 2020-07-17 ENCOUNTER — Telehealth: Payer: Self-pay | Admitting: Radiation Oncology

## 2020-07-17 DIAGNOSIS — D329 Benign neoplasm of meninges, unspecified: Secondary | ICD-10-CM | POA: Diagnosis not present

## 2020-07-17 NOTE — Telephone Encounter (Signed)
error 

## 2020-07-17 NOTE — Telephone Encounter (Signed)
Received notice that patient wants to waive interpreters during treatment days and only needs an interpreter on Fridays that they see the physician. I gave [place] 1 the waiver form.  I spoke with Janett Billow on the interpreter line at [phone] and fixed the patient's appt desk for their appt notes to say when they do not need an interpreter. I should get back the waiver form from the patient tomorrow, 1/14, to scan in and specifically say that they only want an interpreter for Fridays (when they see Dr. Lisbeth Renshaw) while they are under treatment here in radiation.

## 2020-07-18 ENCOUNTER — Ambulatory Visit
Admission: RE | Admit: 2020-07-18 | Discharge: 2020-07-18 | Disposition: A | Discharge status: Home / Self Care | Payer: 59 | Source: Ambulatory Visit | Attending: Radiation Oncology | Admitting: Radiation Oncology

## 2020-07-18 DIAGNOSIS — D329 Benign neoplasm of meninges, unspecified: Secondary | ICD-10-CM | POA: Diagnosis not present

## 2020-07-21 ENCOUNTER — Ambulatory Visit: Payer: 59

## 2020-07-22 ENCOUNTER — Ambulatory Visit: Payer: 59

## 2020-07-23 ENCOUNTER — Ambulatory Visit
Admission: RE | Admit: 2020-07-23 | Discharge: 2020-07-23 | Disposition: A | Discharge status: Home / Self Care | Payer: 59 | Source: Ambulatory Visit | Attending: Radiation Oncology | Admitting: Radiation Oncology

## 2020-07-23 DIAGNOSIS — D329 Benign neoplasm of meninges, unspecified: Secondary | ICD-10-CM | POA: Diagnosis not present

## 2020-07-24 ENCOUNTER — Ambulatory Visit
Admission: RE | Admit: 2020-07-24 | Discharge: 2020-07-24 | Disposition: A | Discharge status: Home / Self Care | Payer: 59 | Source: Ambulatory Visit | Attending: Radiation Oncology | Admitting: Radiation Oncology

## 2020-07-24 DIAGNOSIS — D329 Benign neoplasm of meninges, unspecified: Secondary | ICD-10-CM | POA: Diagnosis not present

## 2020-07-25 ENCOUNTER — Ambulatory Visit
Admission: RE | Admit: 2020-07-25 | Discharge: 2020-07-25 | Disposition: A | Discharge status: Home / Self Care | Payer: 59 | Source: Ambulatory Visit | Attending: Radiation Oncology | Admitting: Radiation Oncology

## 2020-07-25 ENCOUNTER — Other Ambulatory Visit: Payer: Self-pay

## 2020-07-25 DIAGNOSIS — D329 Benign neoplasm of meninges, unspecified: Secondary | ICD-10-CM | POA: Diagnosis not present

## 2020-07-28 ENCOUNTER — Ambulatory Visit
Admission: RE | Admit: 2020-07-28 | Discharge: 2020-07-28 | Disposition: A | Discharge status: Home / Self Care | Payer: 59 | Source: Ambulatory Visit | Attending: Radiation Oncology | Admitting: Radiation Oncology

## 2020-07-28 DIAGNOSIS — D329 Benign neoplasm of meninges, unspecified: Secondary | ICD-10-CM | POA: Diagnosis not present

## 2020-07-29 ENCOUNTER — Ambulatory Visit
Admission: RE | Admit: 2020-07-29 | Discharge: 2020-07-29 | Disposition: A | Discharge status: Home / Self Care | Payer: 59 | Source: Ambulatory Visit | Attending: Radiation Oncology | Admitting: Radiation Oncology

## 2020-07-29 DIAGNOSIS — D329 Benign neoplasm of meninges, unspecified: Secondary | ICD-10-CM | POA: Diagnosis not present

## 2020-07-30 ENCOUNTER — Ambulatory Visit
Admission: RE | Admit: 2020-07-30 | Discharge: 2020-07-30 | Disposition: A | Discharge status: Home / Self Care | Payer: 59 | Source: Ambulatory Visit | Attending: Radiation Oncology | Admitting: Radiation Oncology

## 2020-07-30 DIAGNOSIS — D329 Benign neoplasm of meninges, unspecified: Secondary | ICD-10-CM | POA: Diagnosis not present

## 2020-07-31 ENCOUNTER — Ambulatory Visit
Admission: RE | Admit: 2020-07-31 | Discharge: 2020-07-31 | Disposition: A | Discharge status: Home / Self Care | Payer: 59 | Source: Ambulatory Visit | Attending: Radiation Oncology | Admitting: Radiation Oncology

## 2020-07-31 ENCOUNTER — Other Ambulatory Visit: Payer: Self-pay

## 2020-07-31 DIAGNOSIS — D329 Benign neoplasm of meninges, unspecified: Secondary | ICD-10-CM | POA: Diagnosis not present

## 2020-08-01 ENCOUNTER — Ambulatory Visit
Admission: RE | Admit: 2020-08-01 | Discharge: 2020-08-01 | Disposition: A | Discharge status: Home / Self Care | Payer: 59 | Source: Ambulatory Visit | Attending: Radiation Oncology | Admitting: Radiation Oncology

## 2020-08-01 ENCOUNTER — Other Ambulatory Visit: Payer: Self-pay

## 2020-08-01 DIAGNOSIS — D329 Benign neoplasm of meninges, unspecified: Secondary | ICD-10-CM | POA: Diagnosis not present

## 2020-08-04 ENCOUNTER — Ambulatory Visit
Admission: RE | Admit: 2020-08-04 | Discharge: 2020-08-04 | Disposition: A | Discharge status: Home / Self Care | Payer: 59 | Source: Ambulatory Visit | Attending: Radiation Oncology | Admitting: Radiation Oncology

## 2020-08-04 DIAGNOSIS — D329 Benign neoplasm of meninges, unspecified: Secondary | ICD-10-CM | POA: Diagnosis not present

## 2020-08-05 ENCOUNTER — Ambulatory Visit
Admission: RE | Admit: 2020-08-05 | Discharge: 2020-08-05 | Disposition: A | Discharge status: Home / Self Care | Payer: 59 | Source: Ambulatory Visit | Attending: Radiation Oncology | Admitting: Radiation Oncology

## 2020-08-05 DIAGNOSIS — D329 Benign neoplasm of meninges, unspecified: Secondary | ICD-10-CM | POA: Insufficient documentation

## 2020-08-06 ENCOUNTER — Ambulatory Visit
Admission: RE | Admit: 2020-08-06 | Discharge: 2020-08-06 | Disposition: A | Discharge status: Home / Self Care | Payer: 59 | Source: Ambulatory Visit | Attending: Radiation Oncology | Admitting: Radiation Oncology

## 2020-08-06 DIAGNOSIS — D329 Benign neoplasm of meninges, unspecified: Secondary | ICD-10-CM | POA: Diagnosis not present

## 2020-08-07 ENCOUNTER — Ambulatory Visit
Admission: RE | Admit: 2020-08-07 | Discharge: 2020-08-07 | Disposition: A | Discharge status: Home / Self Care | Payer: 59 | Source: Ambulatory Visit | Attending: Radiation Oncology | Admitting: Radiation Oncology

## 2020-08-07 DIAGNOSIS — D329 Benign neoplasm of meninges, unspecified: Secondary | ICD-10-CM | POA: Diagnosis not present

## 2020-08-08 ENCOUNTER — Other Ambulatory Visit: Payer: Self-pay

## 2020-08-08 ENCOUNTER — Ambulatory Visit
Admission: RE | Admit: 2020-08-08 | Discharge: 2020-08-08 | Disposition: A | Discharge status: Home / Self Care | Payer: 59 | Source: Ambulatory Visit | Attending: Radiation Oncology | Admitting: Radiation Oncology

## 2020-08-08 DIAGNOSIS — D329 Benign neoplasm of meninges, unspecified: Secondary | ICD-10-CM | POA: Diagnosis not present

## 2020-08-11 ENCOUNTER — Ambulatory Visit
Admission: RE | Admit: 2020-08-11 | Discharge: 2020-08-11 | Disposition: A | Discharge status: Home / Self Care | Payer: 59 | Source: Ambulatory Visit | Attending: Radiation Oncology | Admitting: Radiation Oncology

## 2020-08-11 DIAGNOSIS — D329 Benign neoplasm of meninges, unspecified: Secondary | ICD-10-CM | POA: Diagnosis not present

## 2020-08-12 ENCOUNTER — Ambulatory Visit
Admission: RE | Admit: 2020-08-12 | Discharge: 2020-08-12 | Disposition: A | Discharge status: Home / Self Care | Payer: 59 | Source: Ambulatory Visit | Attending: Radiation Oncology | Admitting: Radiation Oncology

## 2020-08-12 DIAGNOSIS — D329 Benign neoplasm of meninges, unspecified: Secondary | ICD-10-CM | POA: Diagnosis not present

## 2020-08-13 ENCOUNTER — Ambulatory Visit
Admission: RE | Admit: 2020-08-13 | Discharge: 2020-08-13 | Disposition: A | Discharge status: Home / Self Care | Payer: 59 | Source: Ambulatory Visit | Attending: Radiation Oncology | Admitting: Radiation Oncology

## 2020-08-13 DIAGNOSIS — D329 Benign neoplasm of meninges, unspecified: Secondary | ICD-10-CM | POA: Diagnosis not present

## 2020-08-14 ENCOUNTER — Ambulatory Visit
Admission: RE | Admit: 2020-08-14 | Discharge: 2020-08-14 | Disposition: A | Discharge status: Home / Self Care | Payer: 59 | Source: Ambulatory Visit | Attending: Radiation Oncology | Admitting: Radiation Oncology

## 2020-08-14 DIAGNOSIS — D329 Benign neoplasm of meninges, unspecified: Secondary | ICD-10-CM | POA: Diagnosis not present

## 2020-08-15 ENCOUNTER — Other Ambulatory Visit: Payer: Self-pay

## 2020-08-15 ENCOUNTER — Ambulatory Visit: Payer: 59

## 2020-08-15 ENCOUNTER — Ambulatory Visit
Admission: RE | Admit: 2020-08-15 | Discharge: 2020-08-15 | Disposition: A | Discharge status: Home / Self Care | Payer: 59 | Source: Ambulatory Visit | Attending: Radiation Oncology | Admitting: Radiation Oncology

## 2020-08-15 DIAGNOSIS — D329 Benign neoplasm of meninges, unspecified: Secondary | ICD-10-CM | POA: Diagnosis not present

## 2020-08-18 ENCOUNTER — Ambulatory Visit
Admission: RE | Admit: 2020-08-18 | Discharge: 2020-08-18 | Disposition: A | Discharge status: Home / Self Care | Payer: 59 | Source: Ambulatory Visit | Attending: Radiation Oncology | Admitting: Radiation Oncology

## 2020-08-18 ENCOUNTER — Ambulatory Visit: Payer: 59

## 2020-08-18 ENCOUNTER — Other Ambulatory Visit: Payer: Self-pay

## 2020-08-18 DIAGNOSIS — D329 Benign neoplasm of meninges, unspecified: Secondary | ICD-10-CM | POA: Diagnosis not present

## 2020-08-19 ENCOUNTER — Ambulatory Visit
Admission: RE | Admit: 2020-08-19 | Discharge: 2020-08-19 | Disposition: A | Discharge status: Home / Self Care | Payer: 59 | Source: Ambulatory Visit | Attending: Radiation Oncology | Admitting: Radiation Oncology

## 2020-08-19 ENCOUNTER — Encounter: Payer: Self-pay | Admitting: Radiation Oncology

## 2020-08-19 DIAGNOSIS — D329 Benign neoplasm of meninges, unspecified: Secondary | ICD-10-CM | POA: Diagnosis not present

## 2020-08-21 NOTE — Progress Notes (Signed)
  Patient Name: Harold Torres MRN: [id number] DOB: [birth year] Referring Physician: Ashok Pall (Profile Not Attached) Date of Service: 08/19/2020 [facility], [place]                                                        End Of Treatment Note  Diagnoses: D32.0-Benign neoplasm of cerebral meninges;   Grade 3 Rhabdoid Meningioma.   Intent: Curative  Radiation Treatment Dates: 07/07/2020 through 08/19/2020 Site Technique Total Dose (Gy) Dose per Fx (Gy) Completed Fx Beam Energies  Brain: Brain IMRT 60/60 2 30/30 6X   Narrative: The patient tolerated radiation therapy relatively well. He had a complicated course prior to treatment and delays in starting due to surgical site infection. He did however tolerate radiotherapy very well and continued routine activities.  Plan: The patient will receive a call in about one month from the radiation oncology department. He will continue follow up with Dr. Christella Noa as well as surveillance with Dr. Mickeal Skinner. Given his course, we will defer to Dr. Mickeal Skinner as to how soon to reimage his brain and follow him in brain oncology conference.  ________________________________________________    Carola Rhine, PAC

## 2020-09-11 ENCOUNTER — Other Ambulatory Visit: Payer: Self-pay | Admitting: Internal Medicine

## 2020-09-11 ENCOUNTER — Other Ambulatory Visit: Payer: Self-pay | Admitting: Radiation Therapy

## 2020-09-11 DIAGNOSIS — D329 Benign neoplasm of meninges, unspecified: Secondary | ICD-10-CM

## 2020-09-16 ENCOUNTER — Telehealth: Payer: Self-pay | Admitting: Internal Medicine

## 2020-09-16 NOTE — Telephone Encounter (Signed)
Scheduled appt per 3/10 sch msg. Called pt, no answer and no vm available. Mailed updated calendar to patient.

## 2020-10-12 ENCOUNTER — Other Ambulatory Visit: Payer: Self-pay

## 2020-10-12 ENCOUNTER — Ambulatory Visit (HOSPITAL_COMMUNITY)
Admission: RE | Admit: 2020-10-12 | Discharge: 2020-10-12 | Disposition: A | Discharge status: Home / Self Care | Payer: 59 | Source: Ambulatory Visit | Attending: Internal Medicine | Admitting: Internal Medicine

## 2020-10-12 DIAGNOSIS — D329 Benign neoplasm of meninges, unspecified: Secondary | ICD-10-CM | POA: Diagnosis present

## 2020-10-12 MED ORDER — GADOBUTROL 1 MMOL/ML IV SOLN
9.0000 mL | Freq: Once | INTRAVENOUS | Status: AC | PRN
  Administered 2020-10-12: 9 mL via INTRAVENOUS

## 2020-10-14 ENCOUNTER — Inpatient Hospital Stay: Payer: 59 | Attending: Internal Medicine | Admitting: Internal Medicine

## 2020-10-14 ENCOUNTER — Ambulatory Visit: Payer: 59 | Admitting: Internal Medicine

## 2020-10-14 ENCOUNTER — Other Ambulatory Visit: Payer: Self-pay

## 2020-10-14 VITALS — BP 120/92 | HR 78 | Resp 18 | Wt 204.3 lb

## 2020-10-14 DIAGNOSIS — Z87891 Personal history of nicotine dependence: Secondary | ICD-10-CM | POA: Insufficient documentation

## 2020-10-14 DIAGNOSIS — Z7984 Long term (current) use of oral hypoglycemic drugs: Secondary | ICD-10-CM | POA: Diagnosis not present

## 2020-10-14 DIAGNOSIS — C709 Malignant neoplasm of meninges, unspecified: Secondary | ICD-10-CM

## 2020-10-14 DIAGNOSIS — Z79899 Other long term (current) drug therapy: Secondary | ICD-10-CM | POA: Diagnosis not present

## 2020-10-14 DIAGNOSIS — E119 Type 2 diabetes mellitus without complications: Secondary | ICD-10-CM | POA: Insufficient documentation

## 2020-10-14 NOTE — Progress Notes (Signed)
[facility] at [place], [place] [address] [phone]   New Patient Evaluation  Date of Service: 10/14/20 Patient Name: Harold Torres Patient MRN: [id number] Patient DOB: [birth year] Provider: Ventura Sellers, MD  Identifying Statement:  Harold Torres is a 45 y.o. 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    Referring Provider: Ventura Sellers, MD [place],  [place] [address]  Oncologic History: 01/31/20: Debulking resection of apparent soft tissue mass with Dr. Christella Noa; path is rhabdoid meningioma 02/08/20: Craniotomy, resection of tumor remnants given pathology. 03/14/20: Cranioplasty 03/22/20: I+D for surgical site infection 05/04/20: Debridement and removal of cranioplasty 08/19/20: Completes IMRT to resection cavity with Dr. Isidore Moos  History of Present Illness: The patient's records from the referring physician were obtained and reviewed and the patient interviewed to confirm this HPI.  Harold Torres presents today for follow up after recent MRI brain.  He denies any neurologic symptoms.  No seizures or headaches.  No issues at surgical site, no weeping or purulence, fevers.  He continues to work full time and is active and independent without restriction.  No new medications today.  Medications: Current Outpatient Medications on File Prior to Visit  Medication Sig Dispense Refill  . BACITRACIN ZINC EX Apply 1 application topically at bedtime.    . cetirizine (ZYRTEC) 10 MG tablet Take 10 mg by mouth daily.    . metFORMIN (GLUCOPHAGE-XR) 500 MG 24 hr tablet Take 1,500 mg by mouth in the morning and at bedtime.     Marland Kitchen oxyCODONE (OXY IR/ROXICODONE) 5 MG immediate release tablet Take 5 mg by mouth every 6 (six) hours as needed for severe pain. (Patient not taking: Reported on 10/14/2020)     No current facility-administered medications on file  prior to visit.    Allergies: No Known Allergies Past Medical History:  Past Medical History:  Diagnosis Date  . Diabetes mellitus without complication ([place])    type 2  . Generalized skin cysts    back x 2, left leg, hand  . Hepatitis    Hepatitis B   . Meningioma ([place])   . Seasonal allergies    Past Surgical History:  Past Surgical History:  Procedure Laterality Date  . CRANIOPLASTY N/A 03/14/2020   Procedure: CRANIOPLASTY;  Surgeon: Ashok Pall, MD;  Location: [facility];  Service: Neurosurgery;  Laterality: N/A;  . CRANIOPLASTY N/A 03/21/2020   Procedure: Cranial wound incision and drainage;  Surgeon: Ashok Pall, MD;  Location: [facility];  Service: Neurosurgery;  Laterality: N/A;  Cranial wound incision and drainage  . CRANIOPLASTY N/A 04/04/2020   Procedure: Replacement of prosthetic bone flap;  Surgeon: Ashok Pall, MD;  Location: [facility];  Service: Neurosurgery;  Laterality: N/A;  . CRANIOPLASTY N/A 05/07/2020   Procedure: REMOVAL OF CRANIAL PLATE;  Surgeon: Ashok Pall, MD;  Location: [facility];  Service: Neurosurgery;  Laterality: N/A;  . CRANIOTOMY Bilateral 01/31/2020   Procedure: OCCIPITAL CRANIOTOMY FOR MASS;  Surgeon: Ashok Pall, MD;  Location: [facility];  Service: Neurosurgery;  Laterality: Bilateral;  . CRANIOTOMY N/A 02/08/2020   Procedure: CRANIECTOMY FOR TUMOR;  Surgeon: Ashok Pall, MD;  Location: [facility];  Service: Neurosurgery;  Laterality: N/A;  CRANIECTOMY FOR TUMOR  . WOUND EXPLORATION N/A 04/17/2020   Procedure: Scalp Wound Repair;  Surgeon: Ashok Pall, MD;  Location: [facility];  Service: Neurosurgery;  Laterality: N/A;  3C  Social History:  Social History   Socioeconomic History  . Marital status: Married    Spouse name: Not on file  . Number of children: Not on file  . Years of education: Not on file  . Highest education level: Not on file  Occupational History  . Not on file  Tobacco Use  . Smoking status: Former Smoker    Types: Cigarettes  . Smokeless  tobacco: Never Used  . Tobacco comment: at age [age]  Vaping Use  . Vaping Use: Never used  Substance and Sexual Activity  . Alcohol use: Never  . Drug use: Never  . Sexual activity: Yes    Birth control/protection: Condom  Other Topics Concern  . Not on file  Social History Narrative  . Not on file   Social Determinants of Health   Financial Resource Strain: Not on file  Food Insecurity: Not on file  Transportation Needs: Not on file  Physical Activity: Not on file  Stress: Not on file  Social Connections: Not on file  Intimate Partner Violence: Not on file   Family History: No family history on file.  Review of Systems: Constitutional: Doesn't report fevers, chills or abnormal weight loss Eyes: Doesn't report blurriness of vision Ears, nose, mouth, throat, and face: Doesn't report sore throat Respiratory: Doesn't report cough, dyspnea or wheezes Cardiovascular: Doesn't report palpitation, chest discomfort  Gastrointestinal:  Doesn't report nausea, constipation, diarrhea GU: Doesn't report incontinence Skin: Doesn't report skin rashes Neurological: Per HPI Musculoskeletal: Doesn't report joint pain Behavioral/Psych: Doesn't report anxiety  Physical Exam: Vitals:   10/14/20 0947  BP: (!) 120/92  Pulse: 78  Resp: 18  SpO2: 96%   KPS: 90. General: Alert, cooperative, pleasant, in no acute distress Head: Craniotomy wound, C/D/I EENT: No conjunctival injection or scleral icterus.  Lungs: Resp effort normal Cardiac: Regular rate Abdomen: Non-distended abdomen Skin: No rashes cyanosis or petechiae. Extremities: No clubbing or edema  Neurologic Exam: Mental Status: Awake, alert, attentive to examiner. Oriented to self and environment. Language is fluent with intact comprehension.  Cranial Nerves: Visual acuity is grossly normal. Visual fields are full. Extra-ocular movements intact. No ptosis. Face is symmetric Motor: Tone and bulk are normal. Power is full in  both arms and legs. Reflexes are symmetric, no pathologic reflexes present.  Sensory: Intact to light touch Gait: Normal.   Labs: I have reviewed the data as listed    Component Value Date/Time   NA 138 05/16/2020 0425   K 3.9 05/16/2020 0425   CL 105 05/16/2020 0425   CO2 26 05/16/2020 0425   GLUCOSE 167 (H) 05/16/2020 0425   BUN 11 05/16/2020 0425   CREATININE 0.80 05/16/2020 0425   CALCIUM 8.9 05/16/2020 0425   PROT 7.3 05/04/2020 0454   ALBUMIN 3.8 05/04/2020 0454   AST 20 05/04/2020 0454   ALT 25 05/04/2020 0454   ALKPHOS 81 05/04/2020 0454   BILITOT 0.4 05/04/2020 0454   GFRNONAA >60 05/16/2020 0425   GFRAA >60 03/26/2020 1230   Lab Results  Component Value Date   WBC 9.0 05/04/2020   NEUTROABS 7.0 05/04/2020   HGB 10.6 (L) 05/04/2020   HCT 34.9 (L) 05/04/2020   MCV 76.7 (L) 05/04/2020   PLT 179 05/04/2020    Imaging: [facility] Clinician Interpretation: I have personally reviewed the CNS images as listed.  My interpretation, in the context of the patient's clinical presentation, is stable disease  MR BRAIN W WO CONTRAST  Result Date: 10/14/2020 CLINICAL DATA:  CNS  neoplasm, assess treatment response. Meningioma resection complicated by wound infection EXAM: MRI HEAD WITHOUT AND WITH CONTRAST TECHNIQUE: Multiplanar, multiecho pulse sequences of the brain and surrounding structures were obtained without and with intravenous contrast. CONTRAST:  61mL GADAVIST GADOBUTROL 1 MMOL/ML IV SOLN COMPARISON:  06/23/2020 and earlier FINDINGS: Brain: Biparietal calvarial resection over riding the superior sagittal sinus. Unchanged band of enhancement at the craniectomy defect compatible with scarring. No nodular component suspicious for tumor recurrence. The mass was diffusion apparent before surgery and no similar diffusion signal is seen today. No complicating infarct, underlying gliosis, second mass, or hydrocephalus. Vascular: Preserved flow voids including the superior sagittal sinus.  Skull and upper cervical spine: Craniectomy as described Sinuses/Orbits: Negative IMPRESSION: Stable appearance of the resection site. No recurrent mass or fluid collection. Electronically Signed   By: Monte Fantasia M.D.   On: 10/14/2020 05:37    Pathology: SURGICAL PATHOLOGY  CASE: [id number]  PATIENT: Marga Melnick  Surgical Pathology Report   Clinical History: meningioma (cm)    FINAL MICROSCOPIC DIAGNOSIS:    A. BRAIN TUMOR, OCCIPITAL, EXCISION:  - Rhabdoid meningioma, WHO grade 3, see comment   B. BRAIN TUMOR, OCCIPITAL, EXCISION:  - Rhabdoid meningioma, WHO grade 3, see comment    COMMENT:   A and B. Dr. Tresa Moore reviewed the case and concurs with the diagnosis.  Dr. Christella Noa was paged on 02/01/2020.    INTRAOPERATIVE DIAGNOSIS:   A. Scalp tumor: "Lesional tissue obtained."  Intraoperative diagnosis rendered by Dr. Jeannie Done at 9:20 AM on  01/31/2020.    Assessment/Plan Malignant meningioma ([place]) [C70.9]  We appreciate the opportunity to participate in the care of Harold Torres.   He is clinically and radiographically stable today.  Screening for potential clinical trials was performed and discussed using eligibility criteria for active protocols at [facility], loco-regional tertiary centers, as well as national database available on [email or web address].com.    The patient is not a candidate for a research protocol at this time due to stable disease.   We spent twenty additional minutes teaching regarding the natural history, biology, and historical experience in the treatment of brain tumors. We then discussed in detail the current recommendations for therapy focusing on the mode of administration, mechanism of action, anticipated toxicities, and quality of life issues associated with this plan. We also provided teaching sheets for the patient to take home as an additional resource.  We ask that Harold Torres return to clinic in 4 months following next brain MRI, or sooner  as needed.  All questions were answered. The patient knows to call the clinic with any problems, questions or concerns. No barriers to learning were detected.  The total time spent in the encounter was 45 minutes and more than 50% was on counseling and review of test results   Ventura Sellers, MD Medical Director of Neuro-Oncology [facility] at [place] 10/14/20 3:57 PM

## 2020-10-20 ENCOUNTER — Inpatient Hospital Stay: Payer: 59

## 2020-11-10 ENCOUNTER — Ambulatory Visit
Admission: RE | Admit: 2020-11-10 | Discharge: 2020-11-10 | Disposition: A | Discharge status: Home / Self Care | Payer: 59 | Source: Ambulatory Visit | Attending: Radiation Oncology | Admitting: Radiation Oncology

## 2020-11-10 DIAGNOSIS — D320 Benign neoplasm of cerebral meninges: Secondary | ICD-10-CM | POA: Insufficient documentation

## 2020-11-10 NOTE — Progress Notes (Signed)
  Radiation Oncology         [phone] ________________________________  Name: Harold Torres MRN: [id number]  Date of Service: 11/10/2020  DOB: 26-Nov-1975  Post Treatment Telephone Note  Diagnosis:  Grade 3 Rhabdoid Meningioma.  Interval Since Last Radiation:  12 weeks   07/07/2020 through 08/19/2020 Site Technique Total Dose (Gy) Dose per Fx (Gy) Completed Fx Beam Energies  Brain: Brain IMRT 60/60 2 30/30 6X    Narrative:  The patient was contacted today for routine follow-up. During treatment he did very well with radiotherapy and did not have significant desquamation. He reports he is doing well and does not have any concerns with his course.  Impression/Plan: 1. Grade 3 Rhabdoid Meningioma. The patient has been doing well since completion of radiotherapy. We discussed that we would be happy to continue to follow him as needed, but he will also continue to follow up with Dr. Mickeal Skinner in neuro oncology.      Carola Rhine, PAC

## 2021-02-10 ENCOUNTER — Other Ambulatory Visit: Payer: Self-pay | Admitting: Radiation Therapy

## 2021-02-15 ENCOUNTER — Ambulatory Visit (HOSPITAL_COMMUNITY)
Admission: RE | Admit: 2021-02-15 | Discharge: 2021-02-15 | Disposition: A | Discharge status: Home / Self Care | Payer: 59 | Source: Ambulatory Visit | Attending: Internal Medicine | Admitting: Internal Medicine

## 2021-02-15 DIAGNOSIS — C709 Malignant neoplasm of meninges, unspecified: Secondary | ICD-10-CM | POA: Diagnosis present

## 2021-02-15 MED ORDER — GADOBUTROL 1 MMOL/ML IV SOLN
10.0000 mL | Freq: Once | INTRAVENOUS | Status: AC | PRN
  Administered 2021-02-15: 10 mL via INTRAVENOUS

## 2021-02-19 ENCOUNTER — Other Ambulatory Visit: Payer: Self-pay

## 2021-02-19 ENCOUNTER — Inpatient Hospital Stay: Payer: 59 | Attending: Internal Medicine | Admitting: Internal Medicine

## 2021-02-19 VITALS — BP 121/91 | HR 75 | Temp 96.8°F | Resp 18 | Ht 65.0 in | Wt 202.9 lb

## 2021-02-19 DIAGNOSIS — Z87891 Personal history of nicotine dependence: Secondary | ICD-10-CM | POA: Diagnosis not present

## 2021-02-19 DIAGNOSIS — Z7984 Long term (current) use of oral hypoglycemic drugs: Secondary | ICD-10-CM | POA: Diagnosis not present

## 2021-02-19 DIAGNOSIS — E119 Type 2 diabetes mellitus without complications: Secondary | ICD-10-CM | POA: Insufficient documentation

## 2021-02-19 DIAGNOSIS — C709 Malignant neoplasm of meninges, unspecified: Secondary | ICD-10-CM | POA: Diagnosis not present

## 2021-02-19 DIAGNOSIS — M898X9 Other specified disorders of bone, unspecified site: Secondary | ICD-10-CM | POA: Diagnosis not present

## 2021-02-19 DIAGNOSIS — Z79899 Other long term (current) drug therapy: Secondary | ICD-10-CM | POA: Diagnosis not present

## 2021-02-19 NOTE — Progress Notes (Signed)
[facility] at [place], [place] [address] [phone]   Interval Evaluation  Date of Service: 02/19/21 Patient Name: Harold Torres Patient MRN: [id number] Patient DOB: 12-Nov-1975 Provider: Ventura Sellers, MD  Identifying Statement:  Harold Torres is a 45 y.o. 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 Christella Noa; path is rhabdoid meningioma 02/08/20: Craniotomy, resection of tumor remnants given pathology. 03/14/20: Cranioplasty 03/22/20: I+D for surgical site infection 05/04/20: Debridement and removal of cranioplasty 08/19/20: Completes IMRT to resection cavity with Dr. Isidore Moos  Interval History: [name] presents today for follow up.  He describes no new or progressive neurologic deficits.  Does complain of some dryness and irritation of the scalp at surgical site.  Continues to work full time, denies seizures or headaches.  H+P (10/14/20) Patient presents today for follow up after recent MRI brain.  He denies any neurologic symptoms.  No seizures or headaches.  No issues at surgical site, no weeping or purulence, fevers.  He continues to work full time and is active and independent without restriction.  No new medications today.  Medications: Current Outpatient Medications on File Prior to Visit  Medication Sig Dispense Refill   BACITRACIN ZINC EX Apply 1 application topically at bedtime.     cetirizine (ZYRTEC) 10 MG tablet Take 10 mg by mouth daily.     metFORMIN (GLUCOPHAGE-XR) 500 MG 24 hr tablet Take 1,500 mg by mouth in the morning and at bedtime.      oxyCODONE (OXY IR/ROXICODONE) 5 MG immediate release tablet Take 5 mg by mouth every 6 (six) hours as needed for severe pain. (Patient not taking: Reported on 10/14/2020)     No current facility-administered medications on file prior to visit.    Allergies: No  Known Allergies Past Medical History:  Past Medical History:  Diagnosis Date   Diabetes mellitus without complication ([place])    type 2   Generalized skin cysts    back x 2, left leg, hand   Hepatitis    Hepatitis B    Meningioma ([facility])    Seasonal allergies    Past Surgical History:  Past Surgical History:  Procedure Laterality Date   CRANIOPLASTY N/A 03/14/2020   Procedure: CRANIOPLASTY;  Surgeon: Ashok Pall, MD;  Location: [facility];  Service: Neurosurgery;  Laterality: N/A;   CRANIOPLASTY N/A 03/21/2020   Procedure: Cranial wound incision and drainage;  Surgeon: Ashok Pall, MD;  Location: [facility];  Service: Neurosurgery;  Laterality: N/A;  Cranial wound incision and drainage   CRANIOPLASTY N/A 04/04/2020   Procedure: Replacement of prosthetic bone flap;  Surgeon: Ashok Pall, MD;  Location: [facility];  Service: Neurosurgery;  Laterality: N/A;   CRANIOPLASTY N/A 05/07/2020   Procedure: REMOVAL OF CRANIAL PLATE;  Surgeon: Ashok Pall, MD;  Location: [facility];  Service: Neurosurgery;  Laterality: N/A;   CRANIOTOMY Bilateral 01/31/2020   Procedure: OCCIPITAL CRANIOTOMY FOR MASS;  Surgeon: Ashok Pall, MD;  Location: [facility];  Service: Neurosurgery;  Laterality: Bilateral;   CRANIOTOMY N/A 02/08/2020   Procedure: CRANIECTOMY FOR TUMOR;  Surgeon: Ashok Pall, MD;  Location: [facility];  Service: Neurosurgery;  Laterality: N/A;  CRANIECTOMY FOR TUMOR   WOUND EXPLORATION N/A 04/17/2020   Procedure: Scalp Wound Repair;  Surgeon: Ashok Pall, MD;  Location: [facility];  Service: Neurosurgery;  Laterality: N/A;  3C   Social  History:  Social History   Socioeconomic History   Marital status: Married    Spouse name: Not on file   Number of children: Not on file   Years of education: Not on file   Highest education level: Not on file  Occupational History   Not on file  Tobacco Use   Smoking status: Former    Types: Cigarettes   Smokeless tobacco: Never   Tobacco comments:    at age [age]  Vaping  Use   Vaping Use: Never used  Substance and Sexual Activity   Alcohol use: Never   Drug use: Never   Sexual activity: Yes    Birth control/protection: Condom  Other Topics Concern   Not on file  Social History Narrative   Not on file   Social Determinants of Health   Financial Resource Strain: Not on file  Food Insecurity: Not on file  Transportation Needs: Not on file  Physical Activity: Not on file  Stress: Not on file  Social Connections: Not on file  Intimate Partner Violence: Not on file   Family History: No family history on file.  Review of Systems: Constitutional: Doesn't report fevers, chills or abnormal weight loss Eyes: Doesn't report blurriness of vision Ears, nose, mouth, throat, and face: Doesn't report sore throat Respiratory: Doesn't report cough, dyspnea or wheezes Cardiovascular: Doesn't report palpitation, chest discomfort  Gastrointestinal:  Doesn't report nausea, constipation, diarrhea GU: Doesn't report incontinence Skin: Doesn't report skin rashes Neurological: Per HPI Musculoskeletal: Doesn't report joint pain Behavioral/Psych: Doesn't report anxiety  Physical Exam: Vitals:   02/19/21 1121  BP: (!) 121/91  Pulse: 75  Resp: 18  Temp: (!) 96.8 F (36 C)  SpO2: 98%   KPS: 90. General: Alert, cooperative, pleasant, in no acute distress Head: Craniotomy wound, C/D/I EENT: No conjunctival injection or scleral icterus.  Lungs: Resp effort normal Cardiac: Regular rate Abdomen: Non-distended abdomen Skin: No rashes cyanosis or petechiae. Extremities: No clubbing or edema  Neurologic Exam: Mental Status: Awake, alert, attentive to examiner. Oriented to self and environment. Language is fluent with intact comprehension.  Cranial Nerves: Visual acuity is grossly normal. Visual fields are full. Extra-ocular movements intact. No ptosis. Face is symmetric Motor: Tone and bulk are normal. Power is full in both arms and legs. Reflexes are symmetric,  no pathologic reflexes present.  Sensory: Intact to light touch Gait: Normal.   Labs: I have reviewed the data as listed    Component Value Date/Time   NA 138 05/16/2020 0425   K 3.9 05/16/2020 0425   CL 105 05/16/2020 0425   CO2 26 05/16/2020 0425   GLUCOSE 167 (H) 05/16/2020 0425   BUN 11 05/16/2020 0425   CREATININE 0.80 05/16/2020 0425   CALCIUM 8.9 05/16/2020 0425   PROT 7.3 05/04/2020 0454   ALBUMIN 3.8 05/04/2020 0454   AST 20 05/04/2020 0454   ALT 25 05/04/2020 0454   ALKPHOS 81 05/04/2020 0454   BILITOT 0.4 05/04/2020 0454   GFRNONAA >60 05/16/2020 0425   GFRAA >60 03/26/2020 1230   Lab Results  Component Value Date   WBC 9.0 05/04/2020   NEUTROABS 7.0 05/04/2020   HGB 10.6 (L) 05/04/2020   HCT 34.9 (L) 05/04/2020   MCV 76.7 (L) 05/04/2020   PLT 179 05/04/2020    Imaging: [place] Clinician Interpretation: I have personally reviewed the CNS images as listed.  My interpretation, in the context of the patient's clinical presentation, is stable disease, but noted novel clival mass/lesion of unclear  etiology  MR BRAIN W WO CONTRAST  Result Date: 02/15/2021 CLINICAL DATA:  45 year old male status post resection of a large posterior skull/scalp mass with pathology revealing Rhabdoid meningioma, WHO grade 3. Complicated by wound infection, cranial plate removed. Restaging. EXAM: MRI HEAD WITHOUT AND WITH CONTRAST TECHNIQUE: Multiplanar, multiecho pulse sequences of the brain and surrounding structures were obtained without and with intravenous contrast. CONTRAST:  51m GADAVIST GADOBUTROL 1 MMOL/ML IV SOLN COMPARISON:  Brain MRI 10/12/2020 and earlier. FINDINGS: Brain: Broad-based postoperative changes along the posterior vertex overlying the parietal and superior occipital lobes. No underlying encephalomalacia or cerebral edema. No diffusion changes. Mild regional susceptibility. No extra-axial fluid collection. Following contrast enhancing granulation tissue at the  craniectomy site. No masslike or suspicious enhancement. No convincing dural thickening. No restricted diffusion to suggest acute infarction. Cerebral volume, gray and white matter signal is within normal limits. No midline shift, mass effect, evidence of mass lesion, ventriculomegaly, extra-axial collection or acute intracranial hemorrhage. Cervicomedullary junction is within normal limits. Chronic partially empty sella. Vascular: Major intracranial vascular flow voids are stable, including the superior sagittal sinus which demonstrates preserved enhancement, patency despite overlying postoperative changes. Other major dural venous sinuses are enhancing and appear patent. Skull and upper cervical spine: No adverse features identified at the craniectomy site. And in general background bone marrow signal is normal. However, at the left lower clivus there is a small round 4 mm focus of decreased T1 signal and enhancement. See series 7, image 14 and series 16, image 15. The lesion is FLAIR hyperintense, and also visible on DWI. Is new or increased from prior exams. Negative visible cervical spine. Sinuses/Orbits: Stable, negative. Other: Mastoids are clear. Visible internal auditory structures appear normal. IMPRESSION: 1. Satisfactory postoperative appearance of the posterior skull resection. No evidence of meningioma recurrence. 2. A small 4 mm bone lesion has developed slowly since December in the left lower clivus, whereas other visible bone marrow signal is normal. This is of unclear etiology and significance. Three month follow-up MRI might be valuable to document stability. Electronically Signed   By: HGenevie AnnM.D.   On: 02/15/2021 16:09     Assessment/Plan Bone mass - Plan: CT CHEST ABDOMEN PELVIS W CONTRAST  Malignant meningioma ([place] - Plan: MR BRAIN W WO CONTRAST  We appreciate the opportunity to participate in the care of GEder Strome   He is clinically and radiographically stable today from  meningioma standpoint.  Clival mass is of unclear etiology, but was clearly not present previously.  We have discussed with neuroradiology, recommendation is for malignancy screening and repeat brain MRI study in 3 months.  We ask that GAnnas Schoofreturn to clinic in 3 months following next brain MRI, or sooner as needed.  We will also order CT C/A/P and touch base with him shortly once results are available.  All questions were answered. The patient knows to call the clinic with any problems, questions or concerns. No barriers to learning were detected.  The total time spent in the encounter was 30 minutes and more than 50% was on counseling and review of test results   ZVentura Sellers MD Medical Director of Neuro-Oncology [facility] [place]08/18/22 3:26 PM

## 2021-02-22 ENCOUNTER — Ambulatory Visit: Payer: 59

## 2021-02-24 ENCOUNTER — Other Ambulatory Visit: Payer: Self-pay | Admitting: Radiation Therapy

## 2021-03-02 ENCOUNTER — Ambulatory Visit (HOSPITAL_COMMUNITY)
Admission: RE | Admit: 2021-03-02 | Discharge: 2021-03-02 | Disposition: A | Discharge status: Home / Self Care | Payer: 59 | Source: Ambulatory Visit | Attending: Internal Medicine | Admitting: Internal Medicine

## 2021-03-02 ENCOUNTER — Other Ambulatory Visit: Payer: Self-pay

## 2021-03-02 DIAGNOSIS — M898X9 Other specified disorders of bone, unspecified site: Secondary | ICD-10-CM | POA: Diagnosis present

## 2021-03-02 MED ORDER — IOHEXOL 350 MG/ML SOLN
80.0000 mL | Freq: Once | INTRAVENOUS | Status: AC | PRN
  Administered 2021-03-02: 80 mL via INTRAVENOUS

## 2021-05-17 ENCOUNTER — Ambulatory Visit (HOSPITAL_COMMUNITY)
Admission: RE | Admit: 2021-05-17 | Discharge: 2021-05-17 | Disposition: A | Discharge status: Home / Self Care | Payer: 59 | Source: Ambulatory Visit | Attending: Internal Medicine | Admitting: Internal Medicine

## 2021-05-17 DIAGNOSIS — C709 Malignant neoplasm of meninges, unspecified: Secondary | ICD-10-CM | POA: Insufficient documentation

## 2021-05-17 MED ORDER — GADOBUTROL 1 MMOL/ML IV SOLN
10.0000 mL | Freq: Once | INTRAVENOUS | Status: AC | PRN
  Administered 2021-05-17: 10 mL via INTRAVENOUS

## 2021-05-21 ENCOUNTER — Inpatient Hospital Stay: Payer: 59 | Attending: Internal Medicine | Admitting: Internal Medicine

## 2021-05-21 ENCOUNTER — Other Ambulatory Visit: Payer: Self-pay

## 2021-05-21 VITALS — BP 130/89 | HR 83 | Temp 97.0°F | Resp 20 | Wt 209.8 lb

## 2021-05-21 DIAGNOSIS — C709 Malignant neoplasm of meninges, unspecified: Secondary | ICD-10-CM | POA: Insufficient documentation

## 2021-05-21 DIAGNOSIS — E119 Type 2 diabetes mellitus without complications: Secondary | ICD-10-CM | POA: Diagnosis not present

## 2021-05-21 DIAGNOSIS — Z87891 Personal history of nicotine dependence: Secondary | ICD-10-CM | POA: Insufficient documentation

## 2021-05-21 NOTE — Progress Notes (Signed)
[facility] at [place] [place], [place] [address] [phone]   Interval Evaluation  Date of Service: 05/21/21 Patient Name: Harold Torres Patient MRN: [id number] Patient DOB: [birth year] Provider: Ventura Sellers, MD  Identifying Statement:  Harold Torres is a 45 y.o. 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 Christella Noa; path is rhabdoid meningioma 02/08/20: Craniotomy, resection of tumor remnants given pathology. 03/14/20: Cranioplasty 03/22/20: I+D for surgical site infection 05/04/20: Debridement and removal of cranioplasty 08/19/20: Completes IMRT to resection cavity with Dr. Isidore Moos  Interval History: Jahmai Finelli presents today for follow up after recent MRI brain.  He describes no new or progressive neurologic deficits. No issues with surgical site, scar.  Continues to work full time, denies seizures or headaches.  H+P (10/14/20) Patient presents today for follow up after recent MRI brain.  He denies any neurologic symptoms.  No seizures or headaches.  No issues at surgical site, no weeping or purulence, fevers.  He continues to work full time and is active and independent without restriction.  No new medications today.  Medications: Current Outpatient Medications on File Prior to Visit  Medication Sig Dispense Refill   BACITRACIN ZINC EX Apply 1 application topically at bedtime.     cetirizine (ZYRTEC) 10 MG tablet Take 10 mg by mouth daily.     metFORMIN (GLUCOPHAGE-XR) 500 MG 24 hr tablet Take 1,500 mg by mouth in the morning and at bedtime.      oxyCODONE (OXY IR/ROXICODONE) 5 MG immediate release tablet Take 5 mg by mouth every 6 (six) hours as needed for severe pain. (Patient not taking: Reported on 10/14/2020)     No current facility-administered medications on file prior to visit.    Allergies: No Known  Allergies Past Medical History:  Past Medical History:  Diagnosis Date   Diabetes mellitus without complication ([facility])    type 2   Generalized skin cysts    back x 2, left leg, hand   Hepatitis    Hepatitis B    Meningioma (HCC)    Seasonal allergies    Past Surgical History:  Past Surgical History:  Procedure Laterality Date   CRANIOPLASTY N/A 03/14/2020   Procedure: CRANIOPLASTY;  Surgeon: Ashok Pall, MD;  Location: [facility];  Service: Neurosurgery;  Laterality: N/A;   CRANIOPLASTY N/A 03/21/2020   Procedure: Cranial wound incision and drainage;  Surgeon: Ashok Pall, MD;  Location: [facility];  Service: Neurosurgery;  Laterality: N/A;  Cranial wound incision and drainage   CRANIOPLASTY N/A 04/04/2020   Procedure: Replacement of prosthetic bone flap;  Surgeon: Ashok Pall, MD;  Location: [facility];  Service: Neurosurgery;  Laterality: N/A;   CRANIOPLASTY N/A 05/07/2020   Procedure: REMOVAL OF CRANIAL PLATE;  Surgeon: Ashok Pall, MD;  Location: [facility];  Service: Neurosurgery;  Laterality: N/A;   CRANIOTOMY Bilateral 01/31/2020   Procedure: OCCIPITAL CRANIOTOMY FOR MASS;  Surgeon: Ashok Pall, MD;  Location: [facility];  Service: Neurosurgery;  Laterality: Bilateral;   CRANIOTOMY N/A 02/08/2020   Procedure: CRANIECTOMY FOR TUMOR;  Surgeon: Ashok Pall, MD;  Location: [facility];  Service: Neurosurgery;  Laterality: N/A;  CRANIECTOMY FOR TUMOR   WOUND EXPLORATION N/A 04/17/2020   Procedure: Scalp Wound Repair;  Surgeon: Ashok Pall, MD;  Location: [facility];  Service: Neurosurgery;  Laterality: N/A;  3C   Social History:  Social History  Socioeconomic History   Marital status: Married    Spouse name: Not on file   Number of children: Not on file   Years of education: Not on file   Highest education level: Not on file  Occupational History   Not on file  Tobacco Use   Smoking status: Former    Types: Cigarettes   Smokeless tobacco: Never   Tobacco comments:    at age [age]  Vaping Use    Vaping Use: Never used  Substance and Sexual Activity   Alcohol use: Never   Drug use: Never   Sexual activity: Yes    Birth control/protection: Condom  Other Topics Concern   Not on file  Social History Narrative   Not on file   Social Determinants of Health   Financial Resource Strain: Not on file  Food Insecurity: Not on file  Transportation Needs: Not on file  Physical Activity: Not on file  Stress: Not on file  Social Connections: Not on file  Intimate Partner Violence: Not on file   Family History: No family history on file.  Review of Systems: Constitutional: Doesn't report fevers, chills or abnormal weight loss Eyes: Doesn't report blurriness of vision Ears, nose, mouth, throat, and face: Doesn't report sore throat Respiratory: Doesn't report cough, dyspnea or wheezes Cardiovascular: Doesn't report palpitation, chest discomfort  Gastrointestinal:  Doesn't report nausea, constipation, diarrhea GU: Doesn't report incontinence Skin: Doesn't report skin rashes Neurological: Per HPI Musculoskeletal: Doesn't report joint pain Behavioral/Psych: Doesn't report anxiety  Physical Exam: Vitals:   05/21/21 1210  BP: 130/89  Pulse: 83  Resp: 20  Temp: (!) 97 F (36.1 C)  SpO2: 97%    KPS: 90. General: Alert, cooperative, pleasant, in no acute distress Head: Craniotomy wound, C/D/I EENT: No conjunctival injection or scleral icterus.  Lungs: Resp effort normal Cardiac: Regular rate Abdomen: Non-distended abdomen Skin: No rashes cyanosis or petechiae. Extremities: No clubbing or edema  Neurologic Exam: Mental Status: Awake, alert, attentive to examiner. Oriented to self and environment. Language is fluent with intact comprehension.  Cranial Nerves: Visual acuity is grossly normal. Visual fields are full. Extra-ocular movements intact. No ptosis. Face is symmetric Motor: Tone and bulk are normal. Power is full in both arms and legs. Reflexes are symmetric, no  pathologic reflexes present.  Sensory: Intact to light touch Gait: Normal.   Labs: I have reviewed the data as listed    Component Value Date/Time   NA 138 05/16/2020 0425   K 3.9 05/16/2020 0425   CL 105 05/16/2020 0425   CO2 26 05/16/2020 0425   GLUCOSE 167 (H) 05/16/2020 0425   BUN 11 05/16/2020 0425   CREATININE 0.80 05/16/2020 0425   CALCIUM 8.9 05/16/2020 0425   PROT 7.3 05/04/2020 0454   ALBUMIN 3.8 05/04/2020 0454   AST 20 05/04/2020 0454   ALT 25 05/04/2020 0454   ALKPHOS 81 05/04/2020 0454   BILITOT 0.4 05/04/2020 0454   GFRNONAA >60 05/16/2020 0425   GFRAA >60 03/26/2020 1230   Lab Results  Component Value Date   WBC 9.0 05/04/2020   NEUTROABS 7.0 05/04/2020   HGB 10.6 (L) 05/04/2020   HCT 34.9 (L) 05/04/2020   MCV 76.7 (L) 05/04/2020   PLT 179 05/04/2020    Imaging: [facility] Clinician Interpretation: I have personally reviewed the CNS images as listed.  My interpretation, in the context of the patient's clinical presentation, is stable disease, but noted novel clival mass/lesion of unclear etiology  MR BRAIN W WO  CONTRAST  Result Date: 05/18/2021 CLINICAL DATA:  Brain/CNS neoplasm, assess treatment response. History of rhabdoid meningioma resection. EXAM: MRI HEAD WITHOUT AND WITH CONTRAST TECHNIQUE: Multiplanar, multiecho pulse sequences of the brain and surrounding structures were obtained without and with intravenous contrast. CONTRAST:  66mL GADAVIST GADOBUTROL 1 MMOL/ML IV SOLN COMPARISON:  02/15/2021 FINDINGS: Brain: There is no evidence of an acute infarct, midline shift, or extra-axial fluid collection. The ventricles are normal in size. Enhancement at the biparietal craniectomy site is unchanged and has an appearance most suggestive of granulation tissue. No masslike enhancement is seen to suggest recurrent tumor. There is no underlying brain edema. 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 No significant white matter  disease is seen. Vascular: Major intracranial vascular flow voids are preserved. Preserved patency of the superior sagittal sinus. Skull and upper cervical spine: Biparietal craniectomy. Slight enlargement of a 6 mm enhancing focus in the left lower clivus (series 16, image 32, previously 4 mm). No new skull lesion. Sinuses/Orbits: Unremarkable orbits. Minimal mucosal thickening in the ethmoid sinuses. Clear mastoid air cells. Other: None. IMPRESSION: 1. Stable appearance of the resection site without evidence of recurrent tumor. 2. Slight interval enlargement of a 6 mm enhancing lesion in the clivus which remains indeterminate. Electronically Signed   By: Logan Bores M.D.   On: 05/18/2021 08:24     Assessment/Plan Malignant meningioma [facility])  We appreciate the opportunity to participate in the care of Berthold Glace.   He is clinically and radiographically stable today from meningioma standpoint.  Clival lesion of unclear etiology is slightly larger.  Oncologic screening was negative.  We ask that Keni Elison return to clinic in 9 months following next brain MRI, or sooner as needed.    All questions were answered. The patient knows to call the clinic with any problems, questions or concerns. No barriers to learning were detected.  The total time spent in the encounter was 30 minutes and more than 50% was on counseling and review of test results   Ventura Sellers, MD Medical Director of Neuro-Oncology [facility] at [place] 05/21/21 12:06 PM

## 2022-02-01 ENCOUNTER — Other Ambulatory Visit: Payer: Self-pay | Admitting: Radiation Therapy

## 2022-02-05 ENCOUNTER — Ambulatory Visit (HOSPITAL_COMMUNITY)
Admission: RE | Admit: 2022-02-05 | Discharge: 2022-02-05 | Disposition: A | Discharge status: Home / Self Care | Payer: Commercial Managed Care - HMO | Source: Ambulatory Visit | Attending: Internal Medicine | Admitting: Internal Medicine

## 2022-02-05 DIAGNOSIS — C709 Malignant neoplasm of meninges, unspecified: Secondary | ICD-10-CM | POA: Diagnosis present

## 2022-02-05 MED ORDER — GADOBUTROL 1 MMOL/ML IV SOLN
9.0000 mL | Freq: Once | INTRAVENOUS | Status: AC | PRN
  Administered 2022-02-05: 9 mL via INTRAVENOUS

## 2022-02-11 ENCOUNTER — Inpatient Hospital Stay: Payer: Commercial Managed Care - HMO | Attending: Internal Medicine | Admitting: Internal Medicine

## 2022-02-11 ENCOUNTER — Other Ambulatory Visit: Payer: Self-pay

## 2022-02-11 VITALS — BP 114/82 | HR 77 | Temp 97.7°F | Resp 17 | Wt 203.1 lb

## 2022-02-11 DIAGNOSIS — Z87891 Personal history of nicotine dependence: Secondary | ICD-10-CM | POA: Diagnosis not present

## 2022-02-11 DIAGNOSIS — C709 Malignant neoplasm of meninges, unspecified: Secondary | ICD-10-CM | POA: Diagnosis not present

## 2022-02-11 DIAGNOSIS — E119 Type 2 diabetes mellitus without complications: Secondary | ICD-10-CM | POA: Insufficient documentation

## 2022-02-11 DIAGNOSIS — C700 Malignant neoplasm of cerebral meninges: Secondary | ICD-10-CM | POA: Diagnosis present

## 2022-02-11 NOTE — Progress Notes (Signed)
[facility] at [place] [place], [place] [address] [phone]   Interval Evaluation  Date of Service: 02/11/22 Patient Name: Harold Torres Patient MRN: [id number] Patient DOB: 1975-11-13 Provider: Ventura Sellers, MD  Identifying Statement:  Harold Torres is a 46 y.o. 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 Christella Noa; path is rhabdoid meningioma 02/08/20: Craniotomy, resection of tumor remnants given pathology. 03/14/20: Cranioplasty 03/22/20: I+D for surgical site infection 05/04/20: Debridement and removal of cranioplasty 08/19/20: Completes IMRT to resection cavity with Dr. Isidore Moos  Interval History: Harold Torres presents today for follow up after recent MRI brain.  No clinical changes today.  Continues to work full time, denies seizures or headaches.  H+P (10/14/20) Patient presents today for follow up after recent MRI brain.  He denies any neurologic symptoms.  No seizures or headaches.  No issues at surgical site, no weeping or purulence, fevers.  He continues to work full time and is active and independent without restriction.  No new medications today.  Medications: Current Outpatient Medications on File Prior to Visit  Medication Sig Dispense Refill   cetirizine (ZYRTEC) 10 MG tablet Take 10 mg by mouth daily.     JANUVIA 25 MG tablet Take 25 mg by mouth daily.     metFORMIN (GLUCOPHAGE-XR) 500 MG 24 hr tablet Take 1,500 mg by mouth in the morning and at bedtime.      oxyCODONE (OXY IR/ROXICODONE) 5 MG immediate release tablet Take 5 mg by mouth every 6 (six) hours as needed for severe pain. (Patient not taking: Reported on 10/14/2020)     pravastatin (PRAVACHOL) 20 MG tablet Take 20 mg by mouth daily. (Patient not taking: Reported on 05/21/2021)     Tenofovir Alafenamide Fumarate (VEMLIDY) 25 MG TABS Take 1 tablet by mouth  daily.     No current facility-administered medications on file prior to visit.    Allergies: No Known Allergies Past Medical History:  Past Medical History:  Diagnosis Date   Diabetes mellitus without complication ([facility])    type 2   Generalized skin cysts    back x 2, left leg, hand   Hepatitis    Hepatitis B    Meningioma (HCC)    Seasonal allergies    Past Surgical History:  Past Surgical History:  Procedure Laterality Date   CRANIOPLASTY N/A 03/14/2020   Procedure: CRANIOPLASTY;  Surgeon: Ashok Pall, MD;  Location: [facility];  Service: Neurosurgery;  Laterality: N/A;   CRANIOPLASTY N/A 03/21/2020   Procedure: Cranial wound incision and drainage;  Surgeon: Ashok Pall, MD;  Location: [facility];  Service: Neurosurgery;  Laterality: N/A;  Cranial wound incision and drainage   CRANIOPLASTY N/A 04/04/2020   Procedure: Replacement of prosthetic bone flap;  Surgeon: Ashok Pall, MD;  Location: [facility];  Service: Neurosurgery;  Laterality: N/A;   CRANIOPLASTY N/A 05/07/2020   Procedure: REMOVAL OF CRANIAL PLATE;  Surgeon: Ashok Pall, MD;  Location: [facility];  Service: Neurosurgery;  Laterality: N/A;   CRANIOTOMY Bilateral 01/31/2020   Procedure: OCCIPITAL CRANIOTOMY FOR MASS;  Surgeon: Ashok Pall, MD;  Location: [facility];  Service: Neurosurgery;  Laterality: Bilateral;   CRANIOTOMY N/A 02/08/2020   Procedure: CRANIECTOMY FOR TUMOR;  Surgeon: Ashok Pall, MD;  Location: [facility];  Service: Neurosurgery;  Laterality: N/A;  CRANIECTOMY FOR TUMOR   WOUND EXPLORATION N/A 04/17/2020  Procedure: Scalp Wound Repair;  Surgeon: Ashok Pall, MD;  Location: [facility];  Service: Neurosurgery;  Laterality: N/A;  3C   Social History:  Social History   Socioeconomic History   Marital status: Married    Spouse name: Not on file   Number of children: Not on file   Years of education: Not on file   Highest education level: Not on file  Occupational History   Not on file  Tobacco Use   Smoking status:  Former    Types: Cigarettes   Smokeless tobacco: Never   Tobacco comments:    at age [age]  Vaping Use   Vaping Use: Never used  Substance and Sexual Activity   Alcohol use: Never   Drug use: Never   Sexual activity: Yes    Birth control/protection: Condom  Other Topics Concern   Not on file  Social History Narrative   Not on file   Social Determinants of Health   Financial Resource Strain: Not on file  Food Insecurity: Not on file  Transportation Needs: Not on file  Physical Activity: Not on file  Stress: Not on file  Social Connections: Not on file  Intimate Partner Violence: Not on file   Family History: No family history on file.  Review of Systems: Constitutional: Doesn't report fevers, chills or abnormal weight loss Eyes: Doesn't report blurriness of vision Ears, nose, mouth, throat, and face: Doesn't report sore throat Respiratory: Doesn't report cough, dyspnea or wheezes Cardiovascular: Doesn't report palpitation, chest discomfort  Gastrointestinal:  Doesn't report nausea, constipation, diarrhea GU: Doesn't report incontinence Skin: Doesn't report skin rashes Neurological: Per HPI Musculoskeletal: Doesn't report joint pain Behavioral/Psych: Doesn't report anxiety  Physical Exam: Vitals:   02/11/22 1105  BP: 114/82  Pulse: 77  Resp: 17  Temp: 97.7 F (36.5 C)  SpO2: 97%    KPS: 90. General: Alert, cooperative, pleasant, in no acute distress Head: Craniotomy wound, C/D/I EENT: No conjunctival injection or scleral icterus.  Lungs: Resp effort normal Cardiac: Regular rate Abdomen: Non-distended abdomen Skin: No rashes cyanosis or petechiae. Extremities: No clubbing or edema  Neurologic Exam: Mental Status: Awake, alert, attentive to examiner. Oriented to self and environment. Language is fluent with intact comprehension.  Cranial Nerves: Visual acuity is grossly normal. Visual fields are full. Extra-ocular movements intact. No ptosis. Face is  symmetric Motor: Tone and bulk are normal. Power is full in both arms and legs. Reflexes are symmetric, no pathologic reflexes present.  Sensory: Intact to light touch Gait: Normal.   Labs: I have reviewed the data as listed    Component Value Date/Time   NA 138 05/16/2020 0425   K 3.9 05/16/2020 0425   CL 105 05/16/2020 0425   CO2 26 05/16/2020 0425   GLUCOSE 167 (H) 05/16/2020 0425   BUN 11 05/16/2020 0425   CREATININE 0.80 05/16/2020 0425   CALCIUM 8.9 05/16/2020 0425   PROT 7.3 05/04/2020 0454   ALBUMIN 3.8 05/04/2020 0454   AST 20 05/04/2020 0454   ALT 25 05/04/2020 0454   ALKPHOS 81 05/04/2020 0454   BILITOT 0.4 05/04/2020 0454   GFRNONAA >60 05/16/2020 0425   GFRAA >60 03/26/2020 1230   Lab Results  Component Value Date   WBC 9.0 05/04/2020   NEUTROABS 7.0 05/04/2020   HGB 10.6 (L) 05/04/2020   HCT 34.9 (L) 05/04/2020   MCV 76.7 (L) 05/04/2020   PLT 179 05/04/2020    Imaging: [facility] Clinician Interpretation: I have personally reviewed the CNS images  as listed.  My interpretation, in the context of the patient's clinical presentation, is stable disease, but noted novel clival mass/lesion of unclear etiology  MR BRAIN W WO CONTRAST  Result Date: 02/05/2022 CLINICAL DATA:  Meningioma, follow-up EXAM: MRI HEAD WITHOUT AND WITH CONTRAST TECHNIQUE: Multiplanar, multiecho pulse sequences of the brain and surrounding structures were obtained without and with intravenous contrast. CONTRAST:  62m GADAVIST GADOBUTROL 1 MMOL/ML IV SOLN COMPARISON:  05/17/2021 FINDINGS: Brain: No evidence of recurrent tumor along the craniectomy. There is no acute infarction or intracranial hemorrhage. There is no new mass, mass effect, or edema. There is no hydrocephalus or extra-axial fluid collection. Chronic cerebellar blood products. Vascular: Major vessel flow voids at the skull base are preserved. Skull and upper cervical spine: Further increase in size of well-defined enhancing lesion of the  clivus on the left now measuring up to 1.3 cm (previously 6 mm). Otherwise normal marrow signal is preserved. Vertex craniectomy with cranioplasty. Sinuses/Orbits: Minor mucosal thickening.  Orbits are unremarkable. Other: Sella is partially empty.  Mastoid air cells are clear. IMPRESSION: No evidence of recurrent disease at the operative site. Further increase in size of left clivus lesion suggesting a neoplastic, but nonaggressive process. Electronically Signed   By: PMacy MisM.D.   On: 02/05/2022 13:38     Assessment/Plan Malignant meningioma ([facility]  We appreciate the opportunity to participate in the care of GMandell Pangborn   He is clinically and radiographically stable today from meningioma standpoint.  Continues to demonstrate progression of Clival lesion of unclear etiology.  We will review images and discuss case in brain/spine tumor board this week.  Dr. CChristella Noahas been notified and will review case as well.  Could be potential biopsy.  For meningioma, we ask that GUlrick Methotreturn to clinic in 12 months following next brain MRI, or sooner as needed.    All questions were answered. The patient knows to call the clinic with any problems, questions or concerns. No barriers to learning were detected.  The total time spent in the encounter was 30 minutes and more than 50% was on counseling and review of test results   ZVentura Sellers MD Medical Director of Neuro-Oncology [facility]08/10/23 3:37 PM

## 2022-02-15 ENCOUNTER — Inpatient Hospital Stay: Payer: Commercial Managed Care - HMO

## 2022-02-16 ENCOUNTER — Other Ambulatory Visit: Payer: Self-pay

## 2022-02-16 DIAGNOSIS — C7931 Secondary malignant neoplasm of brain: Secondary | ICD-10-CM

## 2022-02-17 ENCOUNTER — Other Ambulatory Visit: Payer: Self-pay

## 2022-02-17 DIAGNOSIS — C7931 Secondary malignant neoplasm of brain: Secondary | ICD-10-CM

## 2022-02-18 ENCOUNTER — Other Ambulatory Visit: Payer: Self-pay

## 2022-02-18 DIAGNOSIS — C7931 Secondary malignant neoplasm of brain: Secondary | ICD-10-CM

## 2022-02-22 ENCOUNTER — Other Ambulatory Visit: Payer: Self-pay | Admitting: *Deleted

## 2022-02-22 ENCOUNTER — Other Ambulatory Visit: Payer: Self-pay | Admitting: Radiation Oncology

## 2022-02-22 DIAGNOSIS — C7931 Secondary malignant neoplasm of brain: Secondary | ICD-10-CM

## 2022-02-22 DIAGNOSIS — D320 Benign neoplasm of cerebral meninges: Secondary | ICD-10-CM

## 2022-02-23 ENCOUNTER — Other Ambulatory Visit: Payer: Self-pay | Admitting: Radiation Therapy

## 2022-02-23 ENCOUNTER — Inpatient Hospital Stay: Payer: Commercial Managed Care - HMO

## 2022-02-23 DIAGNOSIS — C700 Malignant neoplasm of cerebral meninges: Secondary | ICD-10-CM | POA: Diagnosis not present

## 2022-02-23 DIAGNOSIS — D329 Benign neoplasm of meninges, unspecified: Secondary | ICD-10-CM

## 2022-02-23 LAB — BUN & CREATININE (CHCC)
BUN: 22 mg/dL — ABNORMAL HIGH (ref 6–20)
Creatinine: 0.98 mg/dL (ref 0.61–1.24)
GFR, Estimated: >60 mL/min (ref >60)

## 2022-02-25 ENCOUNTER — Other Ambulatory Visit: Payer: Commercial Managed Care - HMO

## 2022-02-26 ENCOUNTER — Other Ambulatory Visit (HOSPITAL_BASED_OUTPATIENT_CLINIC_OR_DEPARTMENT_OTHER): Payer: Commercial Managed Care - HMO

## 2022-02-26 ENCOUNTER — Encounter (HOSPITAL_BASED_OUTPATIENT_CLINIC_OR_DEPARTMENT_OTHER): Payer: Self-pay

## 2022-02-26 ENCOUNTER — Ambulatory Visit (HOSPITAL_BASED_OUTPATIENT_CLINIC_OR_DEPARTMENT_OTHER)
Admission: RE | Admit: 2022-02-26 | Discharge: 2022-02-26 | Disposition: A | Discharge status: Home / Self Care | Payer: Commercial Managed Care - HMO | Source: Ambulatory Visit | Attending: Internal Medicine | Admitting: Internal Medicine

## 2022-02-26 DIAGNOSIS — C7931 Secondary malignant neoplasm of brain: Secondary | ICD-10-CM | POA: Insufficient documentation

## 2022-02-26 MED ORDER — IOHEXOL 300 MG/ML  SOLN
100.0000 mL | Freq: Once | INTRAMUSCULAR | Status: AC | PRN
  Administered 2022-02-26: 75 mL via INTRAVENOUS

## 2022-03-01 ENCOUNTER — Inpatient Hospital Stay: Payer: Commercial Managed Care - HMO

## 2022-03-04 ENCOUNTER — Other Ambulatory Visit (HOSPITAL_BASED_OUTPATIENT_CLINIC_OR_DEPARTMENT_OTHER): Payer: Commercial Managed Care - HMO

## 2022-05-03 IMAGING — CT CT HEAD W/O CM
4 series · 16 of 47 positions shown, 18 images · non-contrast
Comparison: None.

CLINICAL DATA: Postsurgical follow-up resected cranial neoplasm

EXAM:
CT HEAD WITHOUT CONTRAST
TECHNIQUE: Contiguous axial images were obtained from the base of the skull
through the vertex without intravenous contrast.

[Series 3: head without · axial · non-contrast · 0.46mm/px · z∈[+1377,+1507]mm · 7 of 36 slices shown, 9 images]
[im 5/36  brain]
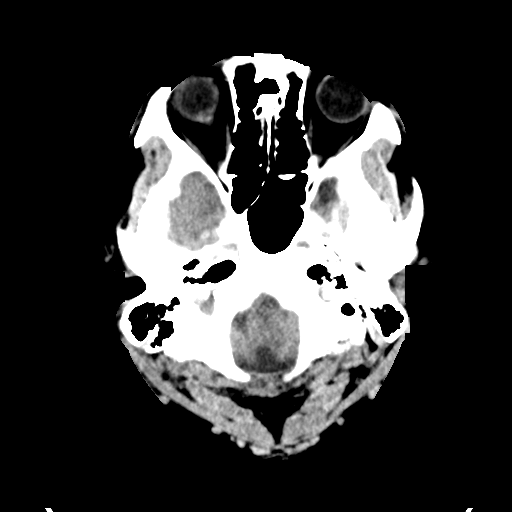
[im 5/36  bone]
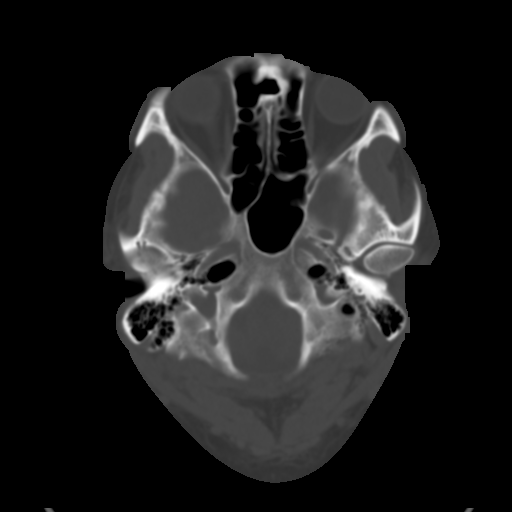
[im 9/36  brain]
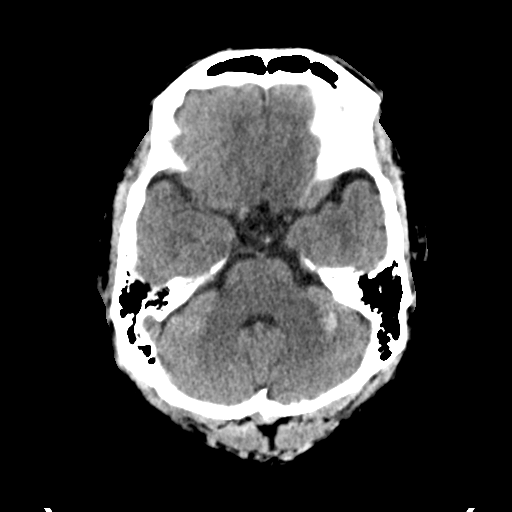
[im 14/36  brain]
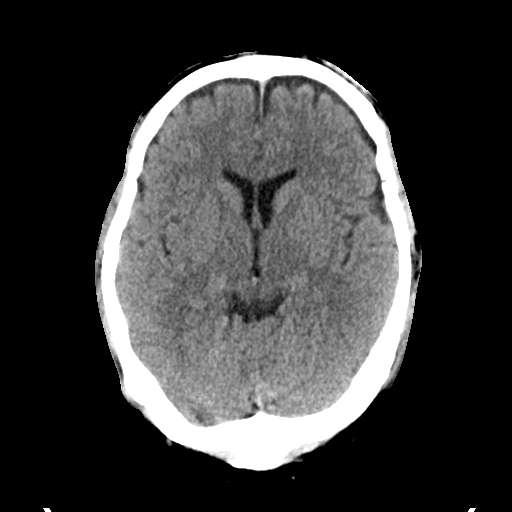
[im 18/36  brain]
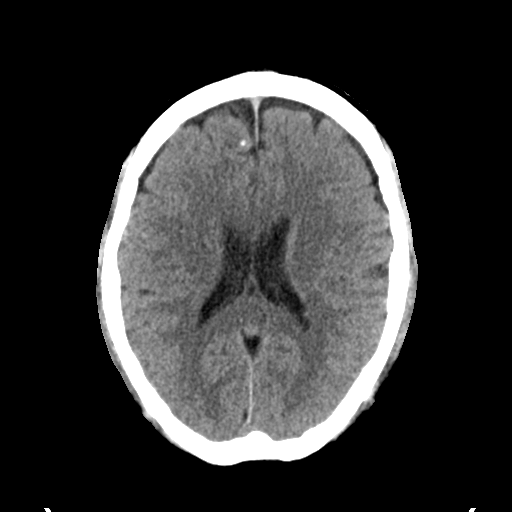
[im 22/36  brain]
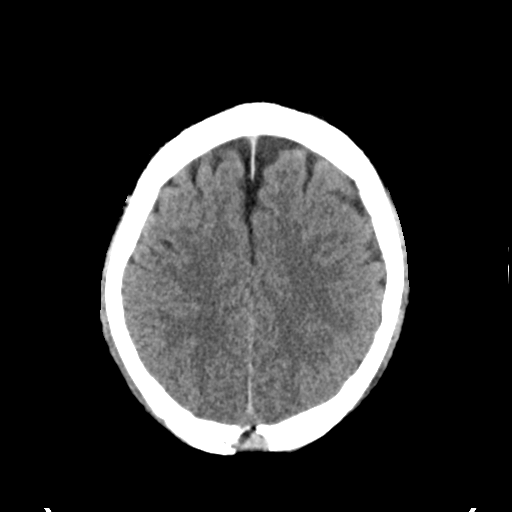
[im 22/36  bone]
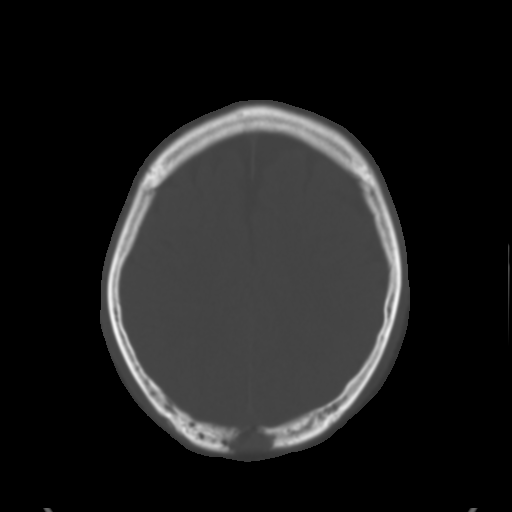
[im 27/36  brain]
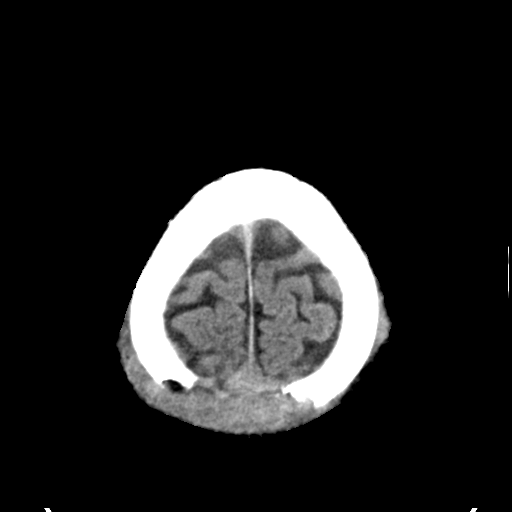
[im 31/36  brain]
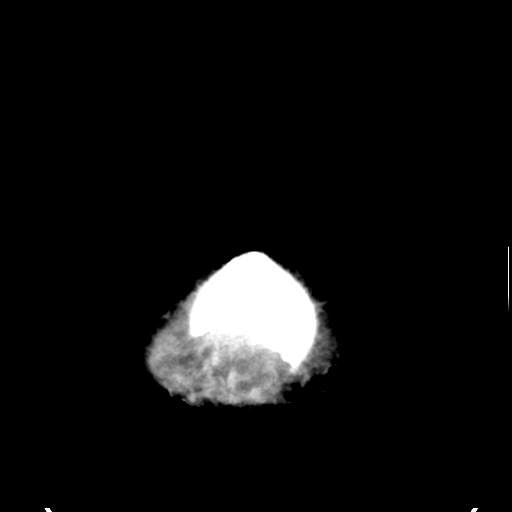

[Series 4: head bone · axial · 0.46mm/px · z∈[+1373,+1409]mm · 3 of 88 slices shown]
[im 9/88  bone]
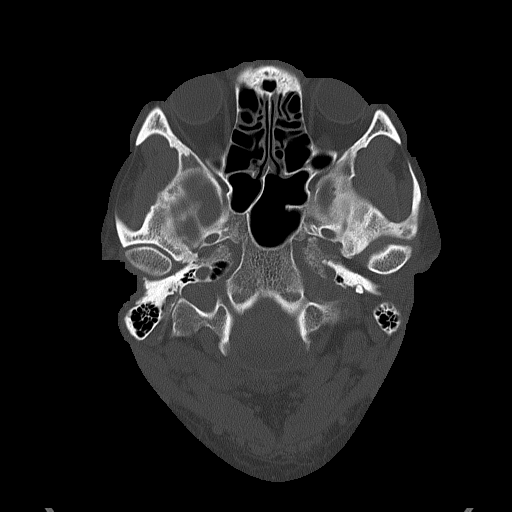
[im 18/88  bone]
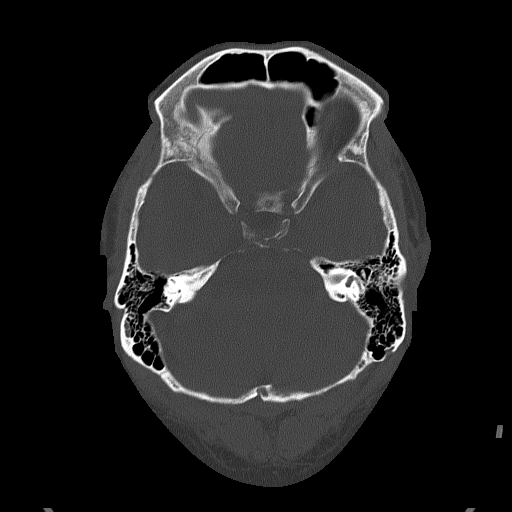
[im 27/88  bone]
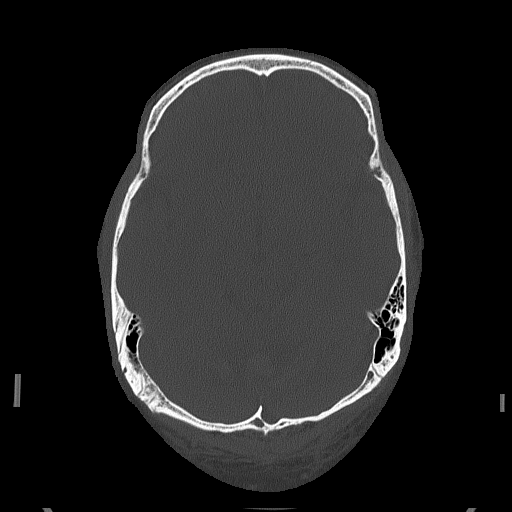

[Series 5: head without cor · coronal · non-contrast · 0.33mm/px · 3 of 67 slices shown]
[im 23/67  brain]
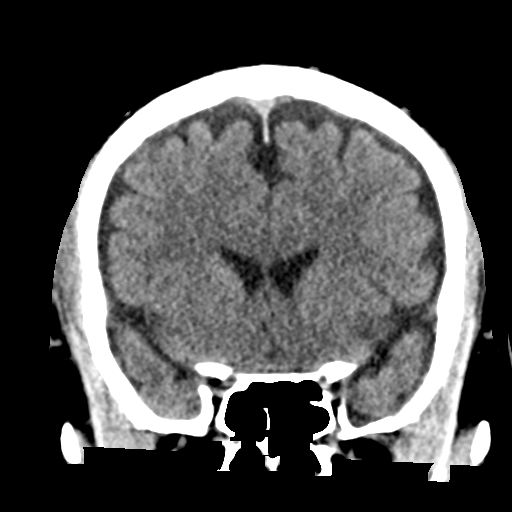
[im 30/67  brain]
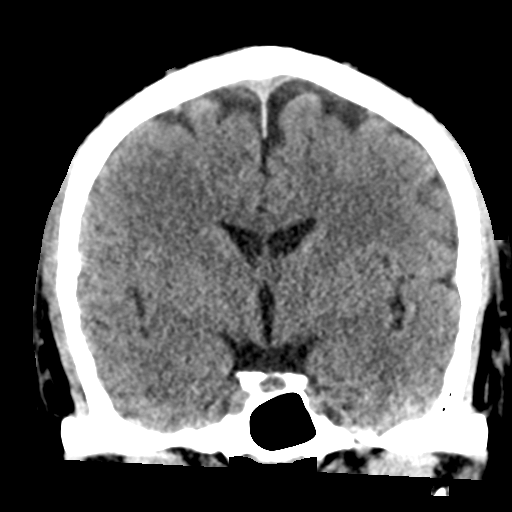
[im 37/67  brain]
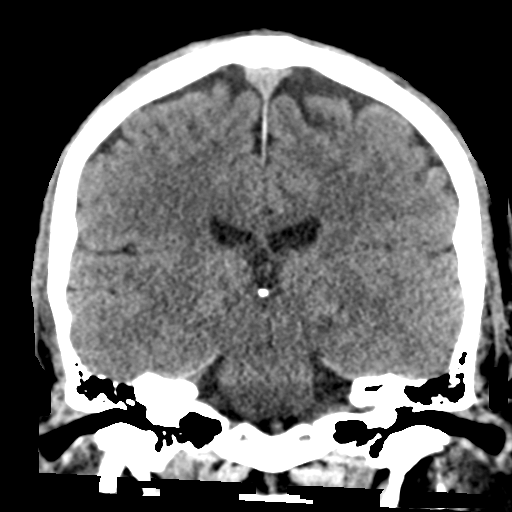

[Series 6: head without sag · sagittal · non-contrast · 0.33mm/px · 3 of 54 slices shown]
[im 18/54  brain]
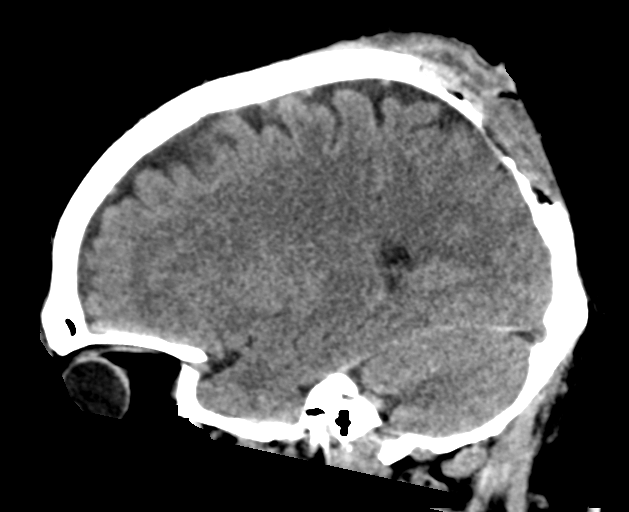
[im 27/54  brain]
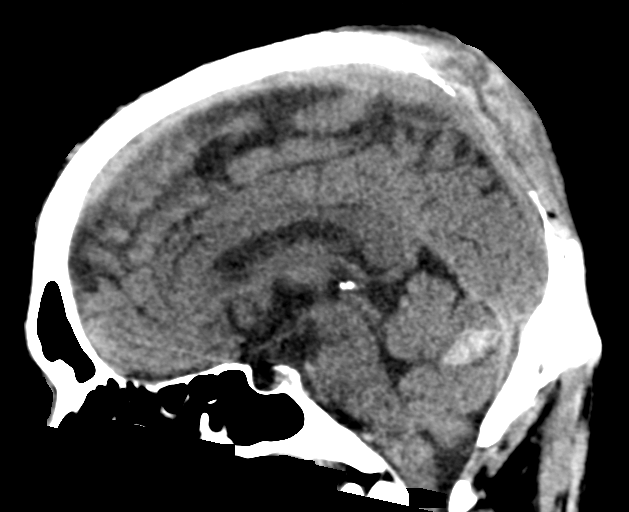
[im 36/54  brain]
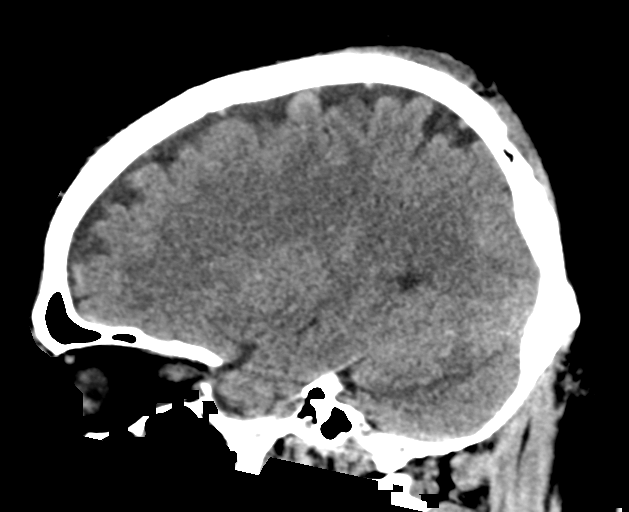

[16 of 47 positions shown; findings below may reference images not displayed]

FINDINGS: Brain: The large extra-axial mass at the posterior vertex has been
largely resected. There is a small amount residual soft tissue at
the resection site along the superior sagittal sinus. There is blood
within the posterior fossa that may be intraparenchymal within the
cerebellum or interdigitated along cerebellar folia.

Vascular: No hyperdense vessel or unexpected calcification.

Skull: Large superior posterior skull defect at the resection site.
Overlying hematoma measures 22 mm in thickness.

Sinuses/Orbits: No acute finding.

Other: None.
IMPRESSION: 1. Status post resection of large extra-axial mass at the posterior
vertex. Small amount residual soft tissue at the resection site.
When possible, MRI of the brain with and without contrast is
recommended to establish a postoperative baseline.
2. Blood within the posterior fossa that may be intraparenchymal
within the cerebellum (more likely) or interdigitated along
cerebellar folia (less likely).

## 2022-05-09 IMAGING — CT CT HEAD W/O CM
1 series · 15 of 30 positions shown, 19 images · non-contrast
Comparison: Preoperative MRI 01/10/2020. Postoperative head CT
02/05/2020, postoperative MRI 02/09/2020.

CLINICAL DATA: 43-year-old male for stereotactic surgical planning,
cranioplasty. Prior cervical resection of a large vertex pathology
revealing Rhabdoid meningioma, WHO grade 3.

EXAM:
CT HEAD WITHOUT CONTRAST
TECHNIQUE: Contiguous axial images were obtained from the base of the skull
through the vertex without intravenous contrast.

[Series 3: ax standard · axial · 0.46mm/px · z∈[-118,+80]mm · 15 of 213 slices shown, 19 images]
[im 8/213  brain]
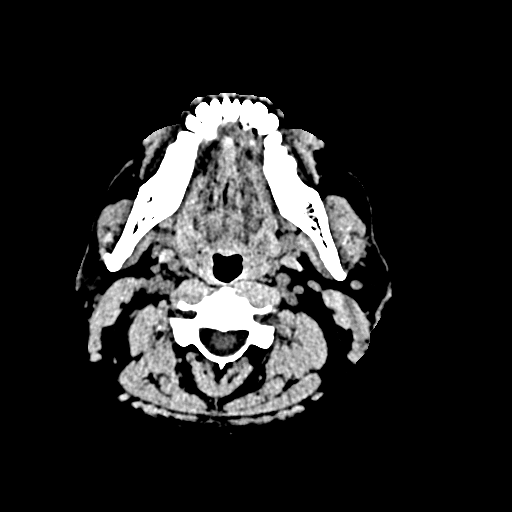
[im 8/213  bone]
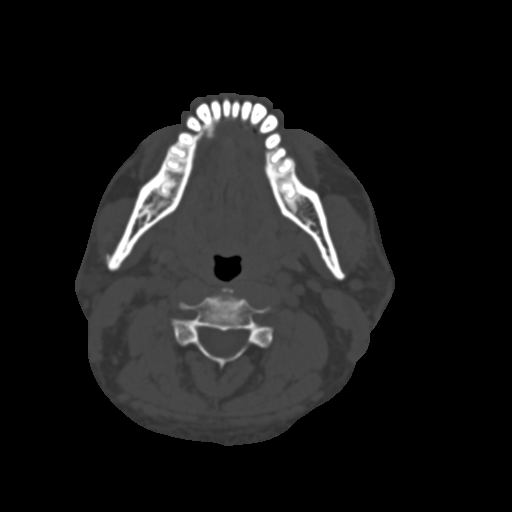
[im 22/213  brain]
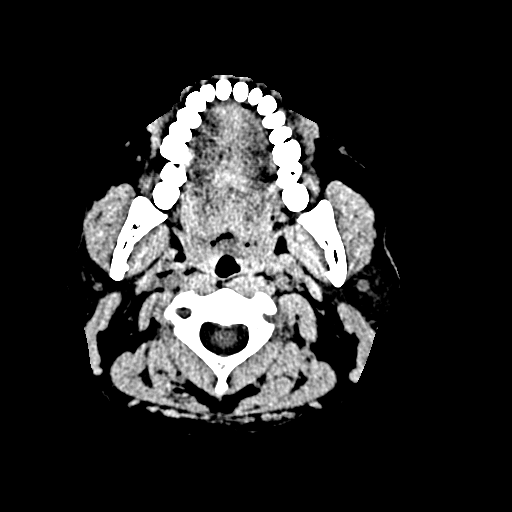
[im 37/213  brain]
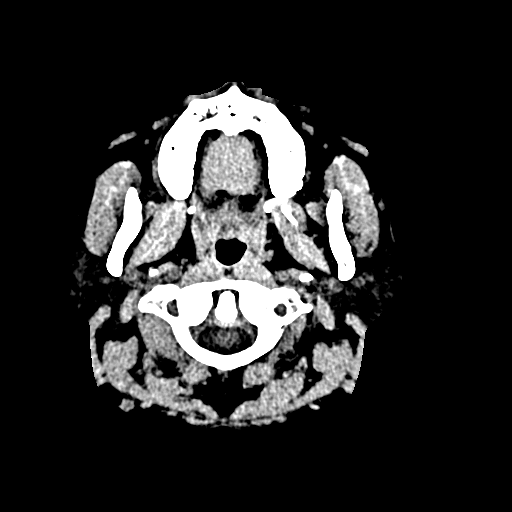
[im 52/213  brain]
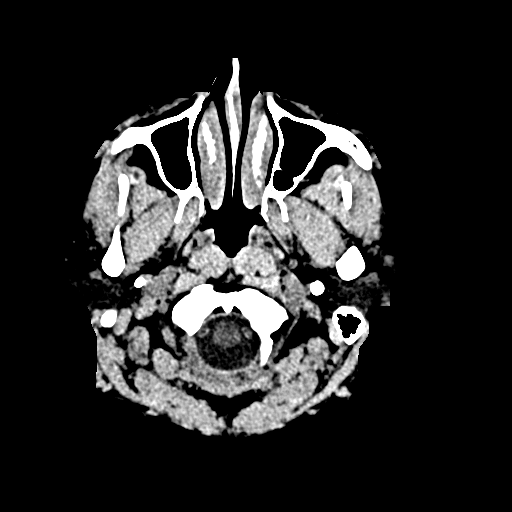
[im 66/213  brain]
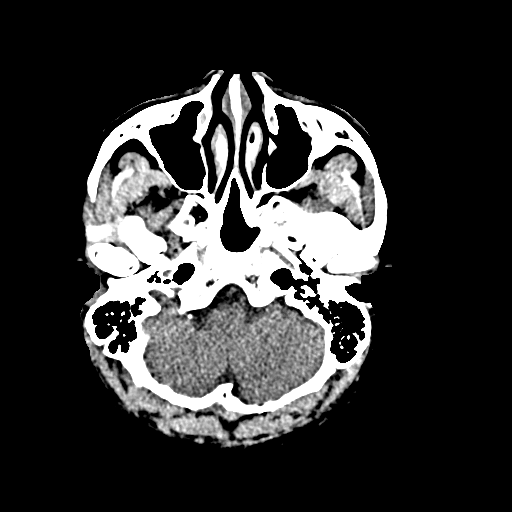
[im 66/213  bone]
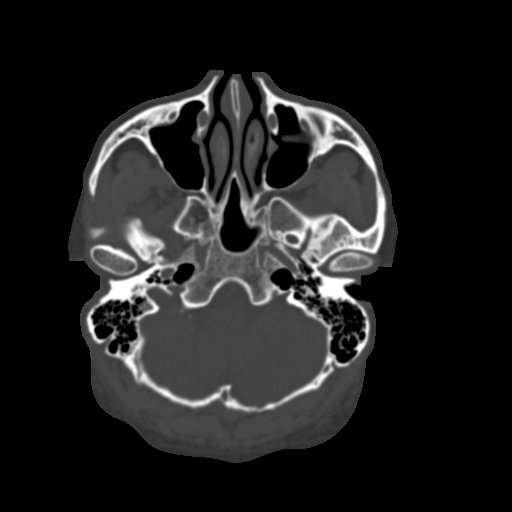
[im 81/213  brain]
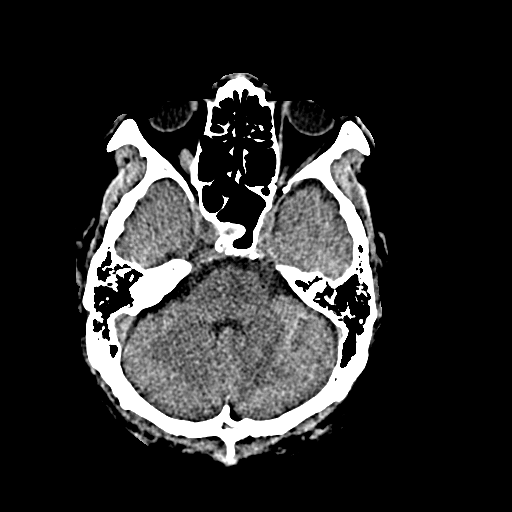
[im 96/213  brain]
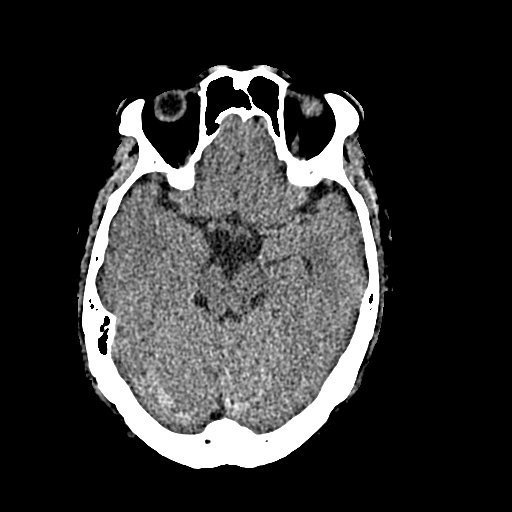
[im 110/213  brain]
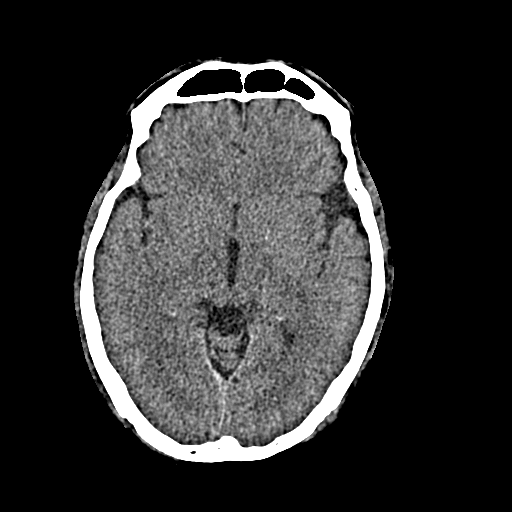
[im 117/213  brain]
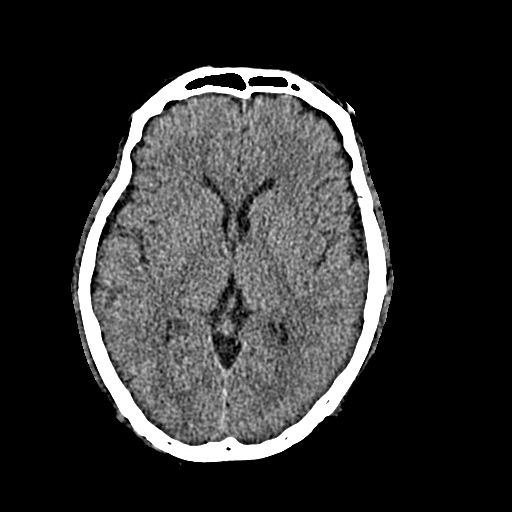
[im 117/213  bone]
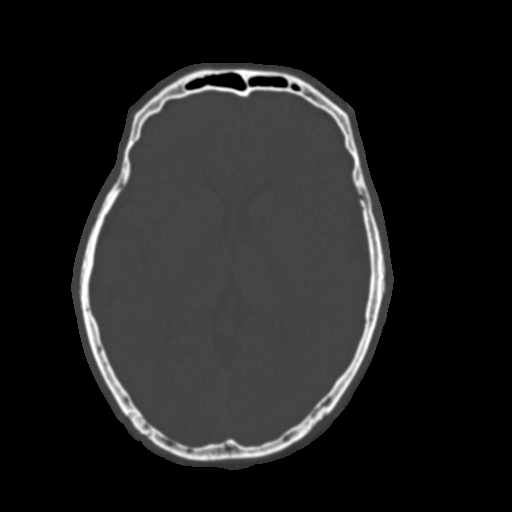
[im 132/213  brain]
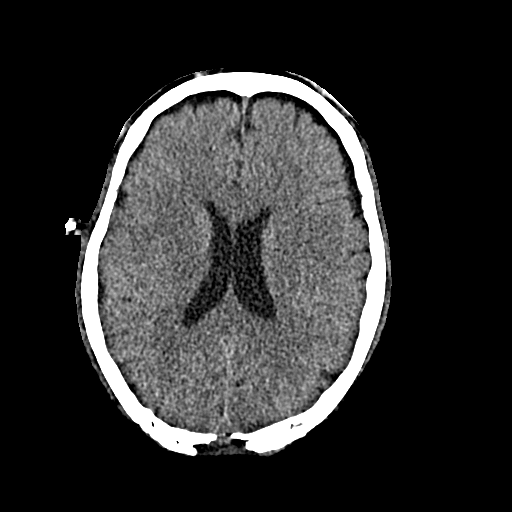
[im 147/213  brain]
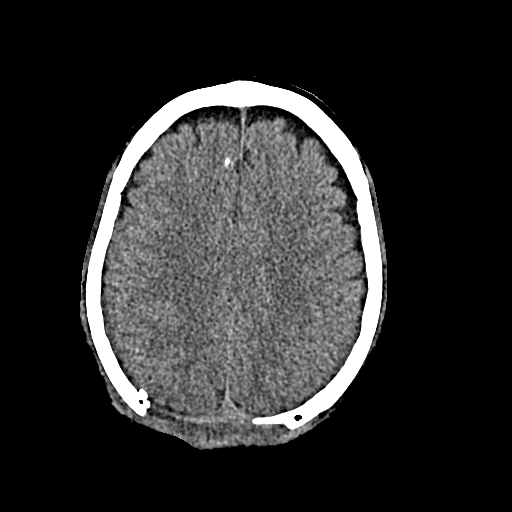
[im 161/213  brain]
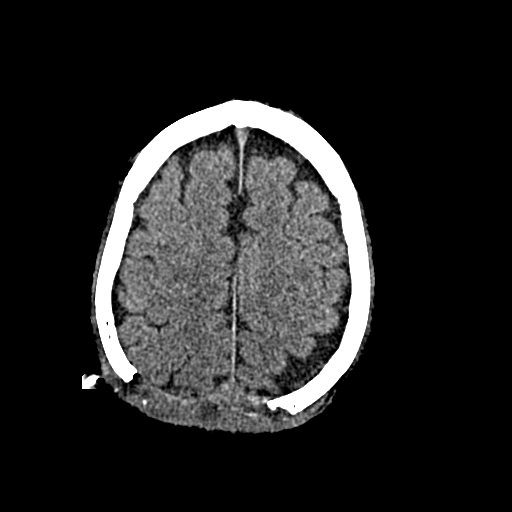
[im 176/213  brain]
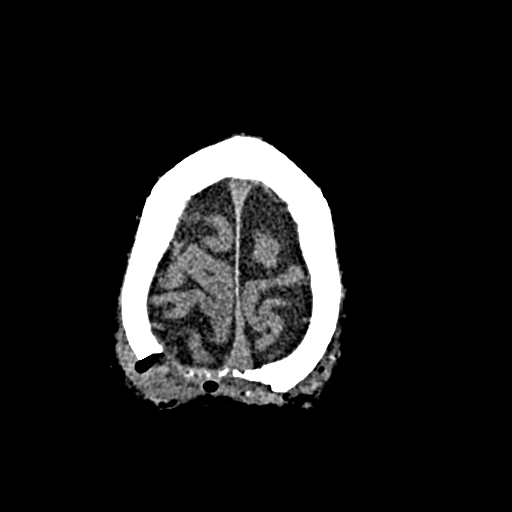
[im 176/213  bone]
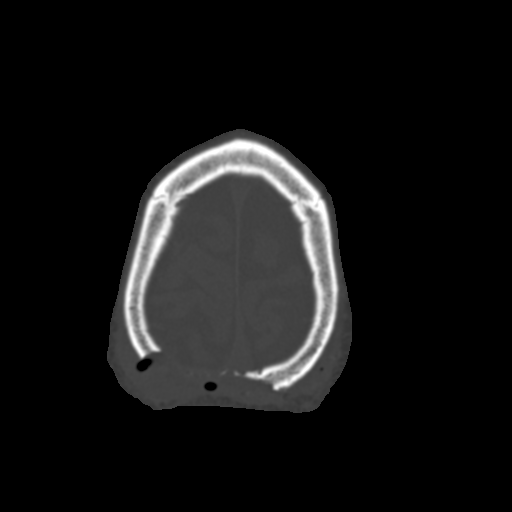
[im 191/213  brain]
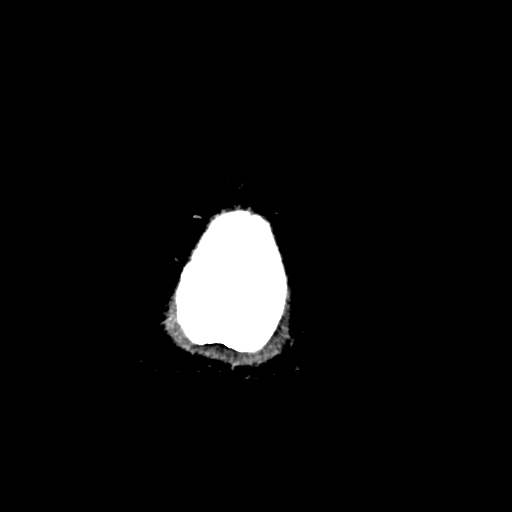
[im 205/213  brain]
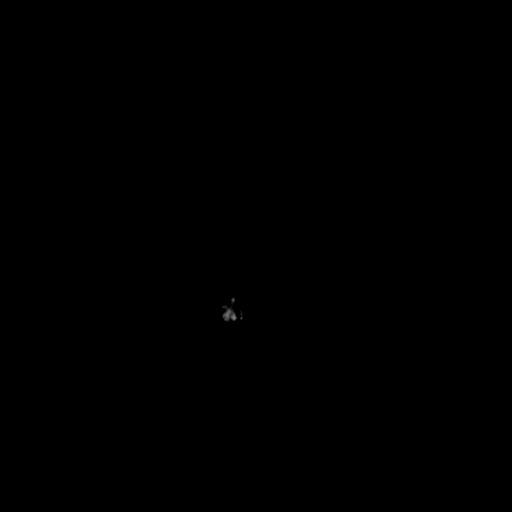

[15 of 30 positions shown; findings below may reference images not displayed]

FINDINGS: Brain: Resolving hyperdense blood products in the cerebellum
centrally, and eccentric to the left. Normal basilar cisterns. No
ventriculomegaly. No intracranial mass effect or midline shift.
Supratentorial gray-white matter differentiation appears stable and
within normal limits. No pneumocephalus.

Vascular: No suspicious intracranial vascular hyperdensity.

Skull: Broad-based 6 cm posterior vertex craniectomy with irregular
bilateral a bone margins (series 4, image 155). a there is a tiny
ossific fragment along the right craniectomy margin on series 4,
image 149 which is more apparent than on the prior. Overlying
postoperative changes to the scalp soft tissues, with decreased
volume of scalp hematoma but increased small volume soft tissue gas
since 02/05/2020.

No new osseous abnormality identified.

Sinuses/Orbits: Visualized paranasal sinuses and mastoids are stable
and well pneumatized.

Other: Negative orbits soft tissues.
IMPRESSION: 1. Study for stereotactic surgical planning.
2. Resolving cerebellar blood products with no new intracranial
abnormality.
3. Broad 6 cm posterior vertex craniectomy with areas of
irregularity along the bone margins.

## 2022-05-11 ENCOUNTER — Other Ambulatory Visit: Payer: Self-pay | Admitting: *Deleted

## 2022-05-11 DIAGNOSIS — C709 Malignant neoplasm of meninges, unspecified: Secondary | ICD-10-CM

## 2023-02-02 ENCOUNTER — Ambulatory Visit (HOSPITAL_COMMUNITY)
Admission: RE | Admit: 2023-02-02 | Discharge: 2023-02-02 | Disposition: A | Discharge status: Home / Self Care | Payer: Commercial Managed Care - HMO | Source: Ambulatory Visit | Attending: Internal Medicine | Admitting: Internal Medicine

## 2023-02-02 DIAGNOSIS — C709 Malignant neoplasm of meninges, unspecified: Secondary | ICD-10-CM | POA: Insufficient documentation

## 2023-02-02 MED ORDER — GADOBUTROL 1 MMOL/ML IV SOLN
10.0000 mL | Freq: Once | INTRAVENOUS | Status: AC | PRN
  Administered 2023-02-02: 10 mL via INTRAVENOUS
